# Patient Record
Sex: Male | Born: 1960 | Race: Black or African American | Hispanic: No | Marital: Single | Smoking: Current every day smoker
Health system: Southern US, Community
[De-identification: ages and names within clinical notes are randomized; demographics above are authoritative.]

## PROBLEM LIST (undated history)

## (undated) DIAGNOSIS — I10 Essential (primary) hypertension: Secondary | ICD-10-CM

## (undated) DIAGNOSIS — I671 Cerebral aneurysm, nonruptured: Secondary | ICD-10-CM

## (undated) DIAGNOSIS — N189 Chronic kidney disease, unspecified: Secondary | ICD-10-CM

## (undated) DIAGNOSIS — E785 Hyperlipidemia, unspecified: Secondary | ICD-10-CM

## (undated) DIAGNOSIS — E119 Type 2 diabetes mellitus without complications: Secondary | ICD-10-CM

## (undated) HISTORY — PX: KIDNEY DONATION: SHX685

## (undated) HISTORY — DX: Chronic kidney disease, unspecified: N18.9

## (undated) HISTORY — DX: Essential (primary) hypertension: I10

## (undated) HISTORY — PX: MANDIBLE FRACTURE SURGERY: SHX706

## (undated) HISTORY — DX: Hyperlipidemia, unspecified: E78.5

## (undated) HISTORY — PX: ROTATOR CUFF REPAIR: SHX139

---

## 2014-01-16 DEATH — deceased

## 2019-02-08 ENCOUNTER — Other Ambulatory Visit: Payer: Self-pay

## 2019-02-08 DIAGNOSIS — Z20822 Contact with and (suspected) exposure to covid-19: Secondary | ICD-10-CM

## 2019-02-09 LAB — NOVEL CORONAVIRUS, NAA: SARS-CoV-2, NAA: NOT DETECTED

## 2019-02-26 ENCOUNTER — Other Ambulatory Visit: Payer: Self-pay

## 2019-02-26 ENCOUNTER — Emergency Department (HOSPITAL_COMMUNITY): Payer: Self-pay

## 2019-02-26 ENCOUNTER — Encounter (HOSPITAL_COMMUNITY): Payer: Self-pay

## 2019-02-26 ENCOUNTER — Emergency Department (HOSPITAL_COMMUNITY)
Admission: EM | Admit: 2019-02-26 | Discharge: 2019-02-26 | Disposition: A | Discharge status: Home / Self Care | Payer: Self-pay | Attending: Emergency Medicine | Admitting: Emergency Medicine

## 2019-02-26 DIAGNOSIS — R519 Headache, unspecified: Secondary | ICD-10-CM | POA: Insufficient documentation

## 2019-02-26 DIAGNOSIS — Z7984 Long term (current) use of oral hypoglycemic drugs: Secondary | ICD-10-CM | POA: Insufficient documentation

## 2019-02-26 DIAGNOSIS — R42 Dizziness and giddiness: Secondary | ICD-10-CM | POA: Insufficient documentation

## 2019-02-26 DIAGNOSIS — Z760 Encounter for issue of repeat prescription: Secondary | ICD-10-CM | POA: Insufficient documentation

## 2019-02-26 HISTORY — DX: Type 2 diabetes mellitus without complications: E11.9

## 2019-02-26 LAB — CBC WITH DIFFERENTIAL/PLATELET
Abs Immature Granulocytes: 0.04 10*3/uL (ref 0.00–0.07)
Basophils Absolute: 0.1 10*3/uL (ref 0.0–0.1)
Basophils Relative: 1 %
Eosinophils Absolute: 0.4 10*3/uL (ref 0.0–0.5)
Eosinophils Relative: 4 %
HCT: 44 % (ref 39.0–52.0)
Hemoglobin: 13.7 g/dL (ref 13.0–17.0)
Immature Granulocytes: 0 %
Lymphocytes Relative: 28 %
Lymphs Abs: 2.6 10*3/uL (ref 0.7–4.0)
MCH: 28.8 pg (ref 26.0–34.0)
MCHC: 31.1 g/dL (ref 30.0–36.0)
MCV: 92.4 fL (ref 80.0–100.0)
Monocytes Absolute: 0.7 10*3/uL (ref 0.1–1.0)
Monocytes Relative: 8 %
Neutro Abs: 5.4 10*3/uL (ref 1.7–7.7)
Neutrophils Relative %: 59 %
Platelets: 299 10*3/uL (ref 150–400)
RBC: 4.76 MIL/uL (ref 4.22–5.81)
RDW: 15 % (ref 11.5–15.5)
WBC: 9.2 10*3/uL (ref 4.0–10.5)
nRBC: 0 % (ref 0.0–0.2)

## 2019-02-26 LAB — BASIC METABOLIC PANEL
Anion gap: 7 (ref 5–15)
BUN: 11 mg/dL (ref 6–20)
CO2: 28 mmol/L (ref 22–32)
Calcium: 8.6 mg/dL — ABNORMAL LOW (ref 8.9–10.3)
Chloride: 103 mmol/L (ref 98–111)
Creatinine, Ser: 1.12 mg/dL (ref 0.61–1.24)
GFR calc Af Amer: >60 mL/min (ref >60)
GFR calc non Af Amer: >60 mL/min (ref >60)
Glucose, Bld: 208 mg/dL — ABNORMAL HIGH (ref 70–99)
Potassium: 4 mmol/L (ref 3.5–5.1)
Sodium: 138 mmol/L (ref 135–145)

## 2019-02-26 LAB — URINALYSIS, ROUTINE W REFLEX MICROSCOPIC
Bilirubin Urine: NEGATIVE
Glucose, UA: 50 mg/dL — AB
Hgb urine dipstick: NEGATIVE
Ketones, ur: NEGATIVE mg/dL
Leukocytes,Ua: NEGATIVE
Nitrite: NEGATIVE
Protein, ur: NEGATIVE mg/dL
Specific Gravity, Urine: 1.005 (ref 1.005–1.030)
pH: 6 (ref 5.0–8.0)

## 2019-02-26 LAB — CBG MONITORING, ED: Glucose-Capillary: 267 mg/dL — ABNORMAL HIGH (ref 70–99)

## 2019-02-26 MED ORDER — IOHEXOL 350 MG/ML SOLN
100.0000 mL | Freq: Once | INTRAVENOUS | Status: AC | PRN
  Administered 2019-02-26: 100 mL via INTRAVENOUS

## 2019-02-26 MED ORDER — SODIUM CHLORIDE 0.9 % IV BOLUS
1000.0000 mL | Freq: Once | INTRAVENOUS | Status: AC
  Administered 2019-02-26: 1000 mL via INTRAVENOUS

## 2019-02-26 MED ORDER — HYDROXYZINE HCL 25 MG PO TABS: 25.0000 mg | ORAL_TABLET | Freq: Four times a day (QID) | ORAL | 0 refills | Status: DC

## 2019-02-26 MED ORDER — SODIUM CHLORIDE (PF) 0.9 % IJ SOLN
INTRAMUSCULAR | Status: AC
  Administered 2019-02-26: 11:00:00
  Filled 2019-02-26: qty 50

## 2019-02-26 MED ORDER — METFORMIN HCL 500 MG PO TABS: 500.0000 mg | ORAL_TABLET | Freq: Two times a day (BID) | ORAL | 0 refills | Status: DC

## 2019-02-26 NOTE — Discharge Instructions (Signed)
Follow up with PCP and Neurosurgery. If you develop sudden onset thunderclap headache. Worsening dizziness, unilateral weakness please seek reevaluation in the ED.

## 2019-02-26 NOTE — ED Triage Notes (Signed)
Pt arrived stating that he ran out of medication yesterday. Reports taking Metformin and something for itching. States last night he became lightheaded.

## 2019-02-26 NOTE — ED Provider Notes (Signed)
[facility]-EMERGENCY DEPT Provider Note   CSN: [id number] Arrival date & time: 02/26/19  [id number]   History   Chief Complaint Chief Complaint  Patient presents with   Medication Refill   Hyperglycemia   HPI Curtis Williamson is a 58 y.o. male with no significant past medical history who presents for evaluation of multiple complaint. Patient states his daughter and him recently moved from [place] this week. He is out of his DM medication (Metformin 500 mg BID). Has had intermittent lightheadedness and dizziness over the last 3 days. States he went to get his [place] drivers license and felt like he couldn't see as well out of his right out on the bottom of the screen. Cannot describe if this was a vision cut or "blurriness." Denies curtain coming down or floaters in eyes. No recent HA or injury. He is unsure is he still has vision changes currently. Denies recent head trauma or injury. States he has also had an intermittent frontal HA. Started gradually 3 days ago. Denies sudden onset thunderclap HA. Denies fever, chills, N/V, neck pain, neck stiffness, facial droop, drooling, unilateral weakness, CP, SOB, hemoptysis, abd pain, dysuria, constipation. Has not taken anything for his symptoms. Denies additional aggravating or alleviating factors. Admits to recent stressors. His daughter and him recently moved into a shelter last night. He cannot described his dizziness. Last took Metformin 3 days ago.  History obtained from patient and past medical records. No interpretor was used.     HPI  Past Medical History:  Diagnosis Date   Diabetes mellitus without complication (HCC)     There are no active problems to display for this patient.   History reviewed. No pertinent surgical history.    Home Medications    Prior to Admission medications   Medication Sig Start Date End Date Taking? Authorizing Provider  hydrOXYzine (ATARAX/VISTARIL) 25 MG tablet Take 1 tablet (25  mg total) by mouth every 6 (six) hours. 02/26/19   Cricket Goodlin A, PA-C  metFORMIN (GLUCOPHAGE) 500 MG tablet Take 1 tablet (500 mg total) by mouth 2 (two) times daily with a meal. 02/26/19 03/28/19  Mercadez Heitman A, PA-C    Family History No family history on file.  Social History Social History   Tobacco Use   Smoking status: Not on file  Substance Use Topics   Alcohol use: Not on file   Drug use: Not on file     Allergies   Mushroom extract complex   Review of Systems Review of Systems  Constitutional: Negative.   HENT: Negative.   Respiratory: Negative.   Cardiovascular: Negative.   Gastrointestinal: Negative.   Genitourinary: Negative.   Musculoskeletal: Negative.   Skin: Negative.   Neurological: Positive for dizziness and headaches. Negative for tremors, seizures, syncope, facial asymmetry, speech difficulty, weakness, light-headedness and numbness.  All other systems reviewed and are negative.  Physical Exam Updated Vital Signs BP (!) 147/102 (BP Location: Left Arm)    Pulse 81    Temp 98.3 F (36.8 C) (Oral)    Resp 16    SpO2 99%   Physical Exam Vitals signs and nursing note reviewed.  Constitutional:      General: He is not in acute distress.    Appearance: He is well-developed. He is not ill-appearing, toxic-appearing or diaphoretic.  HENT:     Head: Normocephalic and atraumatic.  Eyes:     General: Vision grossly intact.     Extraocular Movements: Extraocular movements intact.  Conjunctiva/sclera: Conjunctivae normal.     Pupils: Pupils are equal, round, and reactive to light.     Visual Fields: Right eye visual fields normal and left eye visual fields normal.     Comments: No nystagmus  Neck:     Musculoskeletal: Normal range of motion and neck supple.     Comments:  Normal range of motion. Neck supple.  Full active and passive ROM without pain No midline or paraspinal tenderness No nuchal rigidity or meningeal signs    Cardiovascular:     Rate and Rhythm: Normal rate and regular rhythm.  Pulmonary:     Effort: Pulmonary effort is normal. No respiratory distress.  Abdominal:     General: There is no distension.     Palpations: Abdomen is soft.  Musculoskeletal: Normal range of motion.  Skin:    General: Skin is warm and dry.  Neurological:     Mental Status: He is alert.     Cranial Nerves: Cranial nerves are intact.     Sensory: Sensation is intact.     Motor: Motor function is intact.     Coordination: Coordination is intact.     Gait: Gait is intact.     Comments: Mental Status:  Alert, oriented, thought content appropriate. Speech fluent without evidence of aphasia. Able to follow 2 step commands without difficulty.  Cranial Nerves:  II:  Peripheral visual fields grossly normal, pupils equal, round, reactive to light III,IV, VI: ptosis not present, extra-ocular motions intact bilaterally  V,VII: smile symmetric, facial light touch sensation equal VIII: hearing grossly normal bilaterally  IX,X: midline uvula rise  XI: bilateral shoulder shrug equal and strong XII: midline tongue extension  Motor:  5/5 in upper and lower extremities bilaterally including strong and equal grip strength and dorsiflexion/plantar flexion Sensory: Pinprick and light touch normal in all extremities.  Deep Tendon Reflexes: 2+ and symmetric  Cerebellar: normal finger-to-nose with bilateral upper extremities Gait: normal gait and balance CV: distal pulses palpable throughout      ED Treatments / Results  Labs (all labs ordered are listed, but only abnormal results are displayed) Labs Reviewed  BASIC METABOLIC PANEL - Abnormal; Notable for the following components:      Result Value   Glucose, Bld 208 (*)    Calcium 8.6 (*)    All other components within normal limits  URINALYSIS, ROUTINE W REFLEX MICROSCOPIC - Abnormal; Notable for the following components:   Color, Urine AMBER (*)    Glucose, UA 50 (*)     All other components within normal limits  CBG MONITORING, ED - Abnormal; Notable for the following components:   Glucose-Capillary 267 (*)    All other components within normal limits  CBC WITH DIFFERENTIAL/PLATELET  CBG MONITORING, ED    EKG EKG Interpretation  Date/Time:  Tuesday February 26 2019 07:38:16 EST Ventricular Rate:  68 PR Interval:    QRS Duration: 95 QT Interval:  429 QTC Calculation: 457 R Axis:   50 Text Interpretation: Sinus rhythm Baseline wander in lead(s) V2 Confirmed by Virgina Norfolk [phone]) on 02/26/2019 7:43:13 AM   Radiology Ct Angio Head W Or Wo Contrast  Result Date: 02/26/2019 CLINICAL DATA:  Vertigo EXAM: CT ANGIOGRAPHY HEAD AND NECK TECHNIQUE: Multidetector CT imaging of the head and neck was performed using the standard protocol during bolus administration of intravenous contrast. Multiplanar CT image reconstructions and MIPs were obtained to evaluate the vascular anatomy. Carotid stenosis measurements (when applicable) are obtained utilizing NASCET criteria, using  the distal internal carotid diameter as the denominator. CONTRAST:  OMNIPAQUE IOHEXOL 350 MG/ML SOLN COMPARISON:  None. FINDINGS: CT HEAD FINDINGS Brain: No evidence of acute abnormality, including acute infarct, hemorrhage, hydrocephalus, or mass lesion. Vascular: No hyperdense vessel or unexpected calcification. Skull: Unremarkable. Sinuses: Unremarkable. Orbits: Unremarkable. Review of the MIP images confirms the above findings CTA NECK FINDINGS Aortic arch: Great vessel origins are patent. There is direct origin the left vertebral artery from the aortic arch. There is also direct origin of the right vertebral artery for the aortic arch with the vessel traversing posterior to the esophagus. Right carotid system: Common, internal, and external carotid arteries are patent. No significant stenosis or evidence of dissection. Left carotid system: Common, internal, and external carotid arteries  are patent. No significant stenosis or evidence of dissection. Vertebral arteries: Codominant. Skeleton: Mild degenerative changes of the cervical spine. Other neck: Few very small thyroid nodules are noted. No neck mass adenopathy. Upper chest: No apical lung mass. Review of the MIP images confirms the above findings CTA HEAD FINDINGS Anterior circulation: Intracranial carotid arteries are patent. There is a broad-based 2 mm inferiorly directed outpouching from the distal supraclinoid left ICA near the expected location of a posterior communicating artery. Middle and anterior cerebral arteries are patent. Left A1 ACA dominant. Posterior circulation: Intracranial vertebral, basilar, and posterior cerebral arteries are patent. Venous sinuses: Patent as permitted by contrast timing Anatomic variants: None. Review of the MIP images confirms the above findings IMPRESSION: No acute intracranial abnormality. No large vessel occlusion, significant stenosis, or evidence of dissection. 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 Electronically Signed   By: Guadlupe Spanish M.D.   On: 02/26/2019 09:16   Ct Angio Neck W And/or Wo Contrast  Result Date: 02/26/2019 CLINICAL DATA:  Vertigo EXAM: CT ANGIOGRAPHY HEAD AND NECK TECHNIQUE: Multidetector CT imaging of the head and neck was performed using the standard protocol during bolus administration of intravenous contrast. Multiplanar CT image reconstructions and MIPs were obtained to evaluate the vascular anatomy. Carotid stenosis measurements (when applicable) are obtained utilizing NASCET criteria, using the distal internal carotid diameter as the denominator. CONTRAST:  OMNIPAQUE IOHEXOL 350 MG/ML SOLN COMPARISON:  None. FINDINGS: CT HEAD FINDINGS Brain: No evidence of acute abnormality, including acute infarct, hemorrhage, hydrocephalus, or mass lesion. Vascular: No hyperdense vessel or unexpected calcification. Skull: Unremarkable.  Sinuses: Unremarkable. Orbits: Unremarkable. Review of the MIP images confirms the above findings CTA NECK FINDINGS Aortic arch: Great vessel origins are patent. There is direct origin the left vertebral artery from the aortic arch. There is also direct origin of the right vertebral artery for the aortic arch with the vessel traversing posterior to the esophagus. Right carotid system: Common, internal, and external carotid arteries are patent. No significant stenosis or evidence of dissection. Left carotid system: Common, internal, and external carotid arteries are patent. No significant stenosis or evidence of dissection. Vertebral arteries: Codominant. Skeleton: Mild degenerative changes of the cervical spine. Other neck: Few very small thyroid nodules are noted. No neck mass adenopathy. Upper chest: No apical lung mass. Review of the MIP images confirms the above findings CTA HEAD FINDINGS Anterior circulation: Intracranial carotid arteries are patent. There is a broad-based 2 mm inferiorly directed outpouching from the distal supraclinoid left ICA near the expected location of a posterior communicating artery. Middle and anterior cerebral arteries are patent. Left A1 ACA dominant. Posterior circulation: Intracranial vertebral, basilar, and posterior cerebral arteries are patent. Venous sinuses:  Patent as permitted by contrast timing Anatomic variants: None. Review of the MIP images confirms the above findings IMPRESSION: No acute intracranial abnormality. No large vessel occlusion, significant stenosis, or evidence of dissection. 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 Electronically Signed   By: Guadlupe Spanish M.D.   On: 02/26/2019 09:16    Procedures Procedures (including critical care time)  Medications Ordered in ED Medications  sodium chloride (PF) 0.9 % injection (has no administration in time range)  sodium chloride 0.9 % bolus 1,000 mL (1,000 mLs Intravenous New  Bag/Given 02/26/19 0733)  iohexol (OMNIPAQUE) 350 MG/ML injection 100 mL (100 mLs Intravenous Contrast Given 02/26/19 0831)   Initial Impression / Assessment and Plan / ED Course  I have reviewed the triage vital signs and the nursing notes.  Pertinent labs & imaging results that were available during my care of the patient were reviewed by me and considered in my medical decision making (see chart for details).  58 year old who appear otherwise well presents for multiple complaints. Afebrile, non septic, non ill appearing. Intermittent HA and Dizziness over the last 3-4 days. Possible left eye blurred vision vs field cut yesterday while at [facility] office? He cannot described dizziness and is unsure if he has current field cut/blurred vision? Normal neuro exam without deficits. No current vision changes here in ED. Ambulatory without ataxic gait. No CP/ SOB. Given possible vision cut yesterday with HA and dizziness will obtained labs, CTA head/neck to rule out aneurysm, SAH, acute CVA.  Labs and imaging personally reviewed: CBC without leukocytosis, Hgb 13.7 BMP hyperglycemia to 208 Urinalysis negative for infection, no ketones, No DKA or HHS. 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 EKG without ST/T changes  0830: Patient ambulatory to the CT scanner without ataxic gait. Denies any current dizziness or visual field cuts. Patient not states that he thinks yesterday that is was more "burry eyes" than a visual field cut.  0944: Patient tolerating PO intake without difficulty. Continued Non focal neuro exam without deficits. No visual field defects on exam. Discussed results of CT with attending Dr. Lockie Mola. Feels patient can follow up outpatient with Neurosurgery. No acute CVA, SAH. Sx non consistent with amaurosis fugax or TIA. Discussed plan with patient. He agrees with plan to follow up with NS outpatient. Will refill his home meds.  Patient with  unilateral nystagmus, negative skew test and normal neurologic exam.  No vertical or rotational nystagmus. Patient normal finger-nose and normal gait.  No slurred speech renal or weakness.  Doubt CVA or other central cause of vertigo.  The patient has been appropriately medically screened and/or stabilized in the ED. I have low suspicion for any other emergent medical condition which would require further screening, evaluation or treatment in the ED or require inpatient management.  Patient is hemodynamically stable and in no acute distress.  Patient able to ambulate in department prior to ED.  Evaluation does not show acute pathology that would require ongoing or additional emergent interventions while in the emergency department or further inpatient treatment.  I have discussed the diagnosis with the patient and answered all questions.  Pain is been managed while in the emergency department and patient has no further complaints prior to discharge.  Patient is comfortable with plan discussed in room and is stable for discharge at this time.  I have discussed strict return precautions for returning to the emergency department.  Patient  was encouraged to follow-up with PCP/specialist refer to at discharge.       Final Clinical Impressions(s) / ED Diagnoses   Final diagnoses:  Medication refill  Dizziness    ED Discharge Orders         Ordered    metFORMIN (GLUCOPHAGE) 500 MG tablet  2 times daily with meals     02/26/19 0950    hydrOXYzine (ATARAX/VISTARIL) 25 MG tablet  Every 6 hours     02/26/19 0950           Herchel Hopkin A, PA-C 02/26/19 0952    Lennice Sites, DO 02/26/19 1026

## 2019-03-25 ENCOUNTER — Telehealth: Payer: Self-pay

## 2019-03-25 NOTE — Telephone Encounter (Signed)

## 2019-03-26 ENCOUNTER — Other Ambulatory Visit: Payer: Self-pay

## 2019-03-26 ENCOUNTER — Ambulatory Visit (INDEPENDENT_AMBULATORY_CARE_PROVIDER_SITE_OTHER): Payer: Medicaid Other | Admitting: Internal Medicine

## 2019-03-26 VITALS — BP 149/96 | HR 78 | Temp 97.3°F | Resp 18 | Ht 69.0 in | Wt 206.0 lb

## 2019-03-26 DIAGNOSIS — E119 Type 2 diabetes mellitus without complications: Secondary | ICD-10-CM | POA: Diagnosis not present

## 2019-03-26 DIAGNOSIS — G4489 Other headache syndrome: Secondary | ICD-10-CM

## 2019-03-26 DIAGNOSIS — F17200 Nicotine dependence, unspecified, uncomplicated: Secondary | ICD-10-CM | POA: Insufficient documentation

## 2019-03-26 DIAGNOSIS — I671 Cerebral aneurysm, nonruptured: Secondary | ICD-10-CM | POA: Diagnosis not present

## 2019-03-26 DIAGNOSIS — R519 Headache, unspecified: Secondary | ICD-10-CM | POA: Insufficient documentation

## 2019-03-26 DIAGNOSIS — F17210 Nicotine dependence, cigarettes, uncomplicated: Secondary | ICD-10-CM

## 2019-03-26 DIAGNOSIS — Z2821 Immunization not carried out because of patient refusal: Secondary | ICD-10-CM

## 2019-03-26 DIAGNOSIS — Z5320 Procedure and treatment not carried out because of patient's decision for unspecified reasons: Secondary | ICD-10-CM

## 2019-03-26 DIAGNOSIS — I10 Essential (primary) hypertension: Secondary | ICD-10-CM | POA: Diagnosis not present

## 2019-03-26 MED ORDER — TRUE METRIX METER W/DEVICE KIT: PACK | 0 refills | Status: DC

## 2019-03-26 MED ORDER — TRUEPLUS LANCETS 28G MISC: 4 refills | Status: DC

## 2019-03-26 MED ORDER — TRUE METRIX BLOOD GLUCOSE TEST VI STRP: ORAL_STRIP | 12 refills | Status: DC

## 2019-03-26 MED ORDER — LISINOPRIL-HYDROCHLOROTHIAZIDE 10-12.5 MG PO TABS: 1.0000 | ORAL_TABLET | Freq: Every day | ORAL | 4 refills | Status: DC

## 2019-03-26 MED ORDER — VARENICLINE TARTRATE 0.5 MG PO TABS: 0.5000 mg | ORAL_TABLET | Freq: Two times a day (BID) | ORAL | 0 refills | Status: DC

## 2019-03-26 MED ORDER — METFORMIN HCL 500 MG PO TABS: 500.0000 mg | ORAL_TABLET | Freq: Two times a day (BID) | ORAL | 6 refills | Status: DC

## 2019-03-26 NOTE — Progress Notes (Signed)
Patient ID: Curtis Williamson, male    DOB: February 21, 1961  MRN: [id number]  CC: Establish Care and Diabetes   Subjective: Curtis Williamson is a 58 y.o. male who presents for new pt visit at [facility] His concerns today include:   Pt moved to [place] end of Sept from [place].  Pt has hx of DM and chronic itching. Seen ER at [facility] 02/26/2019 with hyperglycemia, lightheadedness, frontal HA , and poor vision.  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  On Metformin.  Had a glucometer but lost it  when he moved Eating: Did nutrition class in 2005 prior to donating kidney to his niece.  Loss 72 lbs at the time and was able to keep it off -"I'm a meat and potatoes man." Not much veggies.  Drinks sodas, juice, milk.  Not enough water No numbness or tingling in hands and feet C/o blurred vision when reading.  Last eye exam was 2 yrs ago. Wears OTC readers -not very active A1C 6.1 done last mth before thanksgiving at [facility].   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  Last saw PCP [place] in 11/2018 -endorses LT sided temple HA intermittently x 2 mths.  Last 1-2 hrs because he takes something for it. Takes Ibuprofen or ASA.  If he does not take anything, can last 4 hrs.  No associated N/V or dizziness.  Light bothers eyes even without HA.  Has not paid attention to whether vision any worse with HA.  Since ER visit, -freq of HA dec from Q day to few times a wk and not as severe.  No increased pain in the shoulder girdle -No chest pains or shortness of breath  Tob dep: smokes 5-6 cig/day.  Smoked since age 5.  Quit for 1-2 yrs  Around 1997 and again in 2003. Did hypnotherapy. Used the nicotine patch but did  Not like it.  Ready to quit  Past medical, social, family history and surgical histories reviewed  Current Outpatient Medications on File Prior to Visit  Medication Sig Dispense Refill  . hydrOXYzine  (ATARAX/VISTARIL) 25 MG tablet Take 1 tablet (25 mg total) by mouth every 6 (six) hours. 12 tablet 0   No current facility-administered medications on file prior to visit.     Allergies  Allergen Reactions  . Mushroom Extract Complex     Social History   Socioeconomic History  . Marital status: Single    Spouse name: Not on file  . Number of children: 1  . Years of education: Not on file  . Highest education level: Some college, no degree  Occupational History  . Occupation: [employer] operational officer  . Financial resource strain: Not on file  . Food insecurity    Worry: Not on file    Inability: Not on file  . Transportation needs    Medical: Not on file    Non-medical: Not on file  Tobacco Use  . Smoking status: Current Every Day Smoker  . Smokeless tobacco: Never Used  Substance and Sexual Activity  . Alcohol use: Yes  . Drug use: Not on file  . Sexual activity: Not on file  Lifestyle  . Physical activity    Days per week: Not on file    Minutes per session: Not on file  . Stress: Not on file  Relationships  . Social  connections    Talks on phone: Not on file    Gets together: Not on file    Attends religious service: Not on file    Active member of club or organization: Not on file    Attends meetings of clubs or organizations: Not on file    Relationship status: Not on file  . Intimate partner violence    Fear of current or ex partner: Not on file    Emotionally abused: Not on file    Physically abused: Not on file    Forced sexual activity: Not on file  Other Topics Concern  . Not on file  Social History Narrative  . Not on file    Family History  Problem Relation Age of Onset  . Hypertension Mother   . Kidney failure Mother   . Kidney disease Mother   . Heart disease Mother   . ADD / ADHD Daughter   . Anxiety disorder Daughter   . Alcohol abuse Father     Past Surgical History:  Procedure Laterality Date  . KIDNEY DONATION    . MANDIBLE  FRACTURE SURGERY    . ROTATOR CUFF REPAIR Right     ROS: Review of Systems Negative except as stated above  PHYSICAL EXAM: BP (!) 149/96   Pulse 78   Temp (!) 97.3 F (36.3 C) (Temporal)   Resp 18   Ht 5' 9" (1.753 m)   Wt 206 lb (93.4 kg)   SpO2 94%   BMI 30.42 kg/m   Physical Exam  General appearance - alert, well appearing, older African-American male and in no distress Mental status - normal mood, behavior, speech, dress, motor activity, and thought processes Eyes - pupils equal and reactive, extraocular eye movements intact Nose -severe enlargement of nasal turbinates (hx of nasal bone fx) Mouth - mucous membranes moist, pharynx normal without lesions Neck - supple, no significant adenopathy, no thyroid enlargement Lymphatics -no axillary lymphadenopathy  chest - clear to auscultation, no wheezes, rales or rhonchi, symmetric air entry Heart - normal rate, regular rhythm, normal S1, S2, no murmurs, rubs, clicks or gallops Neurological -nonfocal.  Slight tenderness over the left temple area but not over the temporal artery Extremities -no lower extremity edema CMP Latest Ref Rng & Units 02/26/2019  Glucose 70 - 99 mg/dL 208(H)  BUN 6 - 20 mg/dL 11  Creatinine 0.61 - 1.24 mg/dL 1.12  Sodium 135 - 145 mmol/L 138  Potassium 3.5 - 5.1 mmol/L 4.0  Chloride 98 - 111 mmol/L 103  CO2 22 - 32 mmol/L 28  Calcium 8.9 - 10.3 mg/dL 8.6(L)   Lipid Panel  No results found for: CHOL, TRIG, HDL, CHOLHDL, VLDL, LDLCALC, LDLDIRECT  CBC    Component Value Date/Time   WBC 9.2 02/26/2019 0739   RBC 4.76 02/26/2019 0739   HGB 13.7 02/26/2019 0739   HCT 44.0 02/26/2019 0739   PLT 299 02/26/2019 0739   MCV 92.4 02/26/2019 0739   MCH 28.8 02/26/2019 0739   MCHC 31.1 02/26/2019 0739   RDW 15.0 02/26/2019 0739   LYMPHSABS 2.6 02/26/2019 0739   MONOABS 0.7 02/26/2019 0739   EOSABS 0.4 02/26/2019 0739   BASOSABS 0.1 02/26/2019 0739    ASSESSMENT AND PLAN: 1. Type 2 diabetes  mellitus without complication, without long-term current use of insulin ([place]) Patient reports recent A1c of 6.1.  If still his diabetes is under good control.  Dietary counseling given.  I feel he would benefit from seeing the nutritionist.  Advised to eliminate sugary drinks from his diet.  Encouraged him to get in some form of regular exercise with a goal of 150 minutes/week. Continue Metformin - Hemoglobin A1c - Microalbumin/Creatinine Ratio, Urine - metFORMIN (GLUCOPHAGE) 500 MG tablet; Take 1 tablet (500 mg total) by mouth 2 (two) times daily with a meal.  Dispense: 60 tablet; Refill: 6 - Lipid panel - Hepatic Function Panel - glucose blood (TRUE METRIX BLOOD GLUCOSE TEST) test strip; Use as instructed  Dispense: 100 each; Refill: 12 - Blood Glucose Monitoring Suppl (TRUE METRIX METER) w/Device KIT; Use as directed  Dispense: 1 kit; Refill: 0 - TRUEplus Lancets 28G MISC; Use as directed  Dispense: 100 each; Refill: 4 - Ambulatory referral to Ophthalmology - Amb ref to Medical Nutrition Therapy-MNT  2. Essential hypertension Recommend start lisinopril/hydrochlorothiazide.  DASH diet discussed and encouraged. - lisinopril-hydrochlorothiazide (ZESTORETIC) 10-12.5 MG tablet; Take 1 tablet by mouth daily.  Dispense: 30 tablet; Refill: 4  3. New onset of headaches after age 9 We will bring him back in a few weeks to recheck blood pressure and see whether the headaches have improved with better blood pressure control. - CT Head Wo Contrast; Future - Ambulatory referral to Neurology - Sedimentation Rate  4. Aneurysm, cerebral, nonruptured - Ambulatory referral to Neurology  5. Influenza vaccination declined This was offered and patient declined.  6. HIV screening declined Declined  7. Tobacco dependence Patient advised to quit smoking. Discussed health risks associated with smoking including lung and other types of cancers, chronic lung diseases and CV risks.. Pt ready to give trail  of quitting.  Discussed methods to help quit including quitting cold [place], use of NRT, Chantix and Bupropion.  He is willing to try Chantix.  I went over possible side effects of the medication including mood swings and bad dreams.  Less than 5 minutes spent on counseling.  - varenicline (CHANTIX) 0.5 MG tablet; Take 1 tablet (0.5 mg total) by mouth 2 (two) times daily.  Dispense: 60 tablet; Refill: 0     Patient was given the opportunity to ask questions.  Patient verbalized understanding of the plan and was able to repeat key elements of the plan.   Orders Placed This Encounter  Procedures  . CT Head Wo Contrast  . Hemoglobin A1c  . Microalbumin/Creatinine Ratio, Urine  . Lipid panel  . Hepatic Function Panel  . Sedimentation Rate  . Ambulatory referral to Ophthalmology  . Amb ref to Medical Nutrition Therapy-MNT  . Ambulatory referral to Neurology     Requested Prescriptions   Signed Prescriptions Disp Refills  . metFORMIN (GLUCOPHAGE) 500 MG tablet 60 tablet 6    Sig: Take 1 tablet (500 mg total) by mouth 2 (two) times daily with a meal.  . glucose blood (TRUE METRIX BLOOD GLUCOSE TEST) test strip 100 each 12    Sig: Use as instructed  . Blood Glucose Monitoring Suppl (TRUE METRIX METER) w/Device KIT 1 kit 0    Sig: Use as directed  . TRUEplus Lancets 28G MISC 100 each 4    Sig: Use as directed  . lisinopril-hydrochlorothiazide (ZESTORETIC) 10-12.5 MG tablet 30 tablet 4    Sig: Take 1 tablet by mouth daily.    Return in about 2 weeks (around 04/09/2019) for BP recheck.  Karle Plumber, MD, FACP

## 2019-03-26 NOTE — Patient Instructions (Addendum)
Your blood pressure is not controlled.  Goal for blood pressure is 130/80 or lower.  I have sent a prescription to the pharmacy for blood pressure medication called lisinopril/HCTZ for you to take once a day.  Try to limit salt in the food the salt does affect the blood pressure.   Diabetes Mellitus and Standards of Medical Care Managing diabetes (diabetes mellitus) can be complicated. Your diabetes treatment may be managed by a team of health care providers, including:  A physician who specializes in diabetes (endocrinologist).  A nurse practitioner or physician assistant.  Nurses.  A diet and nutrition specialist (registered dietitian).  A certified diabetes educator (CDE).  An exercise specialist.  A pharmacist.  An eye doctor.  A foot specialist (podiatrist).  A dentist.  A primary care provider.  A mental health provider. Your health care providers follow guidelines to help you get the best quality of care. The following schedule is a general guideline for your diabetes management plan. Your health care providers may give you more specific instructions. Physical exams Upon being diagnosed with diabetes mellitus, and each year after that, your health care provider will ask about your medical and family history. He or she will also do a physical exam. Your exam may include:  Measuring your height, weight, and body mass index (BMI).  Checking your blood pressure. This will be done at every routine medical visit. Your target blood pressure may vary depending on your medical conditions, your age, and other factors.  Thyroid gland exam.  Skin exam.  Screening for damage to your nerves (peripheral neuropathy). This may include checking the pulse in your legs and feet and checking the level of sensation in your hands and feet.  A complete foot exam to inspect the structure and skin of your feet, including checking for cuts, bruises, redness, blisters, sores, or other  problems.  Screening for blood vessel (vascular) problems, which may include checking the pulse in your legs and feet and checking your temperature. Blood tests Depending on your treatment plan and your personal needs, you may have the following tests done:  HbA1c (hemoglobin A1c). This test provides information about blood sugar (glucose) control over the previous 2-3 months. It is used to adjust your treatment plan, if needed. This test will be done: ? At least 2 times a year, if you are meeting your treatment goals. ? 4 times a year, if you are not meeting your treatment goals or if treatment goals have changed.  Lipid testing, including total, LDL, and HDL cholesterol and triglyceride levels. ? The goal for LDL is less than 100 mg/dL (5.5 mmol/L). If you are at high risk for complications, the goal is less than 70 mg/dL (3.9 mmol/L). ? The goal for HDL is 40 mg/dL (2.2 mmol/L) or higher for men and 50 mg/dL (2.8 mmol/L) or higher for women. An HDL cholesterol of 60 mg/dL (3.3 mmol/L) or higher gives some protection against heart disease. ? The goal for triglycerides is less than 150 mg/dL (8.3 mmol/L).  Liver function tests.  Kidney function tests.  Thyroid function tests. Dental and eye exams  Visit your dentist two times a year.  If you have type 1 diabetes, your health care provider may recommend an eye exam 3-5 years after you are diagnosed, and then once a year after your first exam. ? For children with type 1 diabetes, a health care provider may recommend an eye exam when your child is age 14 or older and has  had diabetes for 3-5 years. After the first exam, your child should get an eye exam once a year.  If you have type 2 diabetes, your health care provider may recommend an eye exam as soon as you are diagnosed, and then once a year after your first exam. Immunizations   The yearly flu (influenza) vaccine is recommended for everyone 6 months or older who has  diabetes.  The pneumonia (pneumococcal) vaccine is recommended for everyone 2 years or older who has diabetes. If you are 40 or older, you may get the pneumonia vaccine as a series of two separate shots.  The hepatitis B vaccine is recommended for adults shortly after being diagnosed with diabetes.  Adults and children with diabetes should receive all other vaccines according to age-specific recommendations from the Centers for Disease Control and Prevention (CDC). Mental and emotional health Screening for symptoms of eating disorders, anxiety, and depression is recommended at the time of diagnosis and afterward as needed. If your screening shows that you have symptoms (positive screening result), you may need more evaluation and you may work with a mental health care provider. Treatment plan Your treatment plan will be reviewed at every medical visit. You and your health care provider will discuss:  How you are taking your medicines, including insulin.  Any side effects you are experiencing.  Your blood glucose target goals.  The frequency of your blood glucose monitoring.  Lifestyle habits, such as activity level as well as tobacco, alcohol, and substance use. Diabetes self-management education Your health care provider will assess how well you are monitoring your blood glucose levels and whether you are taking your insulin correctly. He or she may refer you to:  A certified diabetes educator to manage your diabetes throughout your life, starting at diagnosis.  A registered dietitian who can create or review your personal nutrition plan.  An exercise specialist who can discuss your activity level and exercise plan. Summary  Managing diabetes (diabetes mellitus) can be complicated. Your diabetes treatment may be managed by a team of health care providers.  Your health care providers follow guidelines in order to help you get the best quality of care.  Standards of care including  having regular physical exams, blood tests, blood pressure monitoring, immunizations, screening tests, and education about how to manage your diabetes.  Your health care providers may also give you more specific instructions based on your individual health. This information is not intended to replace advice given to you by your health care provider. Make sure you discuss any questions you have with your health care provider. Document Released: 01/30/2009 Document Revised: 12/22/2017 Document Reviewed: 01/01/2016 Elsevier Patient Education  2020 [employer].

## 2019-03-26 NOTE — Progress Notes (Signed)
New patient appointment.  Doesn't check FSBS at home. Doesn't have a meter. Denies nausea, vomiting, numbness/tingling in feet, polyuria, polydipsia.  Declines flu shot.  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 Has never been on BP medication.

## 2019-03-27 ENCOUNTER — Encounter: Payer: Self-pay | Admitting: Neurology

## 2019-03-27 ENCOUNTER — Other Ambulatory Visit: Payer: Self-pay

## 2019-03-27 ENCOUNTER — Telehealth: Payer: Self-pay

## 2019-03-27 ENCOUNTER — Ambulatory Visit: Payer: Self-pay

## 2019-03-27 DIAGNOSIS — E119 Type 2 diabetes mellitus without complications: Secondary | ICD-10-CM

## 2019-03-27 LAB — MICROALBUMIN / CREATININE URINE RATIO
Creatinine, Urine: 115.9 mg/dL
Microalb/Creat Ratio: <3 mg/g creat (ref 0–29)
Microalbumin, Urine: <3 ug/mL

## 2019-03-27 LAB — LIPID PANEL
Chol/HDL Ratio: 4.3 ratio (ref 0.0–5.0)
Cholesterol, Total: 185 mg/dL (ref 100–199)
HDL: 43 mg/dL (ref >39)
LDL Chol Calc (NIH): 119 mg/dL — ABNORMAL HIGH (ref 0–99)
Triglycerides: 130 mg/dL (ref 0–149)
VLDL Cholesterol Cal: 23 mg/dL (ref 5–40)

## 2019-03-27 LAB — HEPATIC FUNCTION PANEL
ALT: 48 IU/L — ABNORMAL HIGH (ref 0–44)
AST: 34 IU/L (ref 0–40)
Albumin: 4.7 g/dL (ref 3.8–4.9)
Alkaline Phosphatase: 91 IU/L (ref 39–117)
Bilirubin Total: 0.3 mg/dL (ref 0.0–1.2)
Bilirubin, Direct: 0.11 mg/dL (ref 0.00–0.40)
Total Protein: 7.1 g/dL (ref 6.0–8.5)

## 2019-03-27 LAB — HEMOGLOBIN A1C
Est. average glucose Bld gHb Est-mCnc: 163 mg/dL
Hgb A1c MFr Bld: 7.3 % — ABNORMAL HIGH (ref 4.8–5.6)

## 2019-03-27 MED ORDER — TRUE METRIX BLOOD GLUCOSE TEST VI STRP: ORAL_STRIP | 3 refills | Status: DC

## 2019-03-27 NOTE — Telephone Encounter (Signed)
CPT [phone] will have to be self pay since patient only has [facility].  Patient is scheduled to have CT done at  [facility] on 04/02/2019 @ 3:45 PM. No special prep. If appointment date & time do not work he can call [phone] to reschedule.  Please notify patient of appointment.

## 2019-03-28 ENCOUNTER — Other Ambulatory Visit: Payer: Self-pay

## 2019-03-28 DIAGNOSIS — E119 Type 2 diabetes mellitus without complications: Secondary | ICD-10-CM

## 2019-03-28 LAB — SEDIMENTATION RATE: Sed Rate: 4 mm/hr (ref 0–30)

## 2019-03-28 MED ORDER — ACCU-CHEK FASTCLIX LANCETS MISC: 2 refills | Status: DC

## 2019-03-28 MED ORDER — TRUE METRIX METER W/DEVICE KIT: PACK | 0 refills | Status: DC

## 2019-03-28 MED ORDER — ACCU-CHEK GUIDE ME W/DEVICE KIT: 1.0000 | PACK | Freq: Two times a day (BID) | 0 refills | Status: DC

## 2019-03-28 MED ORDER — ACCU-CHEK GUIDE VI STRP: ORAL_STRIP | 2 refills | Status: DC

## 2019-03-29 ENCOUNTER — Other Ambulatory Visit: Payer: Self-pay | Admitting: Internal Medicine

## 2019-03-30 ENCOUNTER — Other Ambulatory Visit: Payer: Self-pay | Admitting: Internal Medicine

## 2019-03-30 DIAGNOSIS — E1165 Type 2 diabetes mellitus with hyperglycemia: Secondary | ICD-10-CM

## 2019-03-30 DIAGNOSIS — R7989 Other specified abnormal findings of blood chemistry: Secondary | ICD-10-CM

## 2019-03-30 DIAGNOSIS — R945 Abnormal results of liver function studies: Secondary | ICD-10-CM

## 2019-03-30 MED ORDER — ATORVASTATIN CALCIUM 10 MG PO TABS: 10.0000 mg | ORAL_TABLET | Freq: Every day | ORAL | 4 refills | Status: DC

## 2019-04-01 NOTE — Telephone Encounter (Signed)
Pt. Aware.

## 2019-04-02 ENCOUNTER — Ambulatory Visit (HOSPITAL_COMMUNITY)
Admission: RE | Admit: 2019-04-02 | Discharge: 2019-04-02 | Disposition: A | Discharge status: Home / Self Care | Payer: Medicaid Other | Source: Ambulatory Visit | Attending: Internal Medicine | Admitting: Internal Medicine

## 2019-04-02 ENCOUNTER — Encounter: Payer: Self-pay | Admitting: Registered"

## 2019-04-02 ENCOUNTER — Other Ambulatory Visit: Payer: Self-pay

## 2019-04-02 ENCOUNTER — Encounter: Payer: Medicaid Other | Attending: Internal Medicine | Admitting: Registered"

## 2019-04-02 DIAGNOSIS — E119 Type 2 diabetes mellitus without complications: Secondary | ICD-10-CM | POA: Insufficient documentation

## 2019-04-02 DIAGNOSIS — R519 Headache, unspecified: Secondary | ICD-10-CM | POA: Diagnosis present

## 2019-04-02 IMAGING — CT CT HEAD W/O CM
3 of 4 series · 13 of 47 positions shown, 15 images · non-contrast
Comparison: [DATE]
COMPARISON: [DATE]

Addendum:
CLINICAL DATA: Headache with hypertension

EXAM:
CT HEAD WITHOUT CONTRAST
TECHNIQUE: Contiguous axial images were obtained from the base of the skull
through the vertex without intravenous contrast.

[Series 3: head without ax · axial · non-contrast · 0.36mm/px · z∈[-137,-9]mm · 7 of 36 slices shown, 9 images]
[im 5/36  brain]
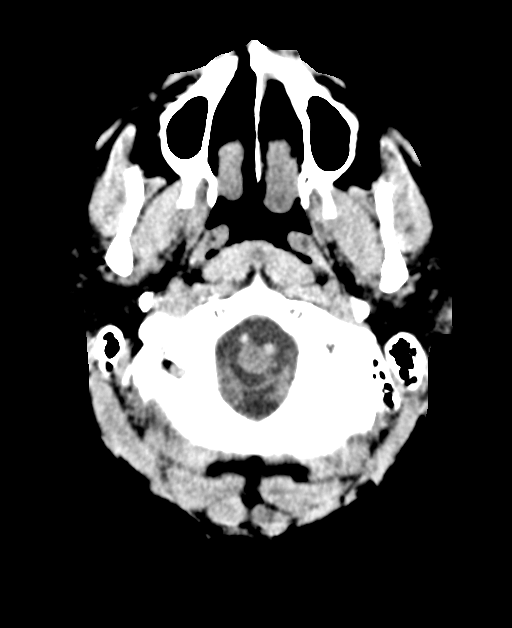
[im 5/36  bone]
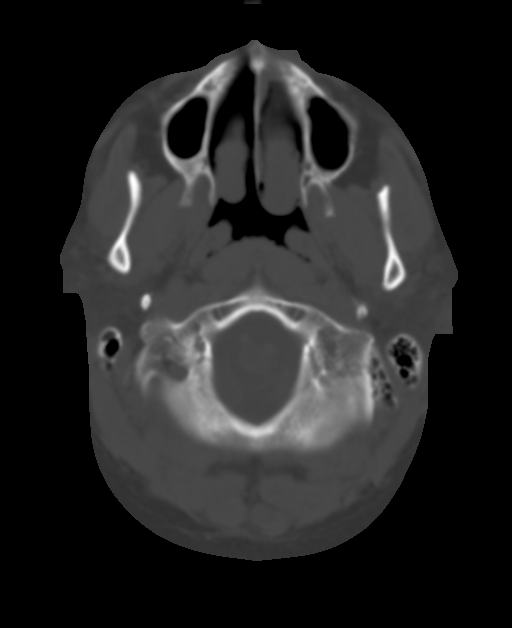
[im 9/36  brain]
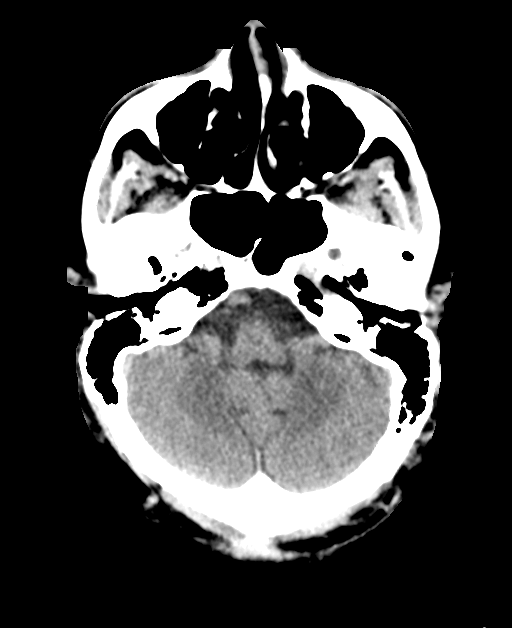
[im 14/36  brain]
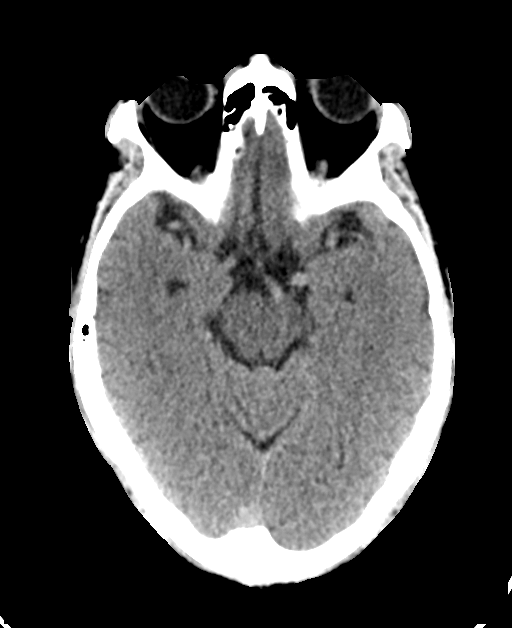
[im 18/36  brain]
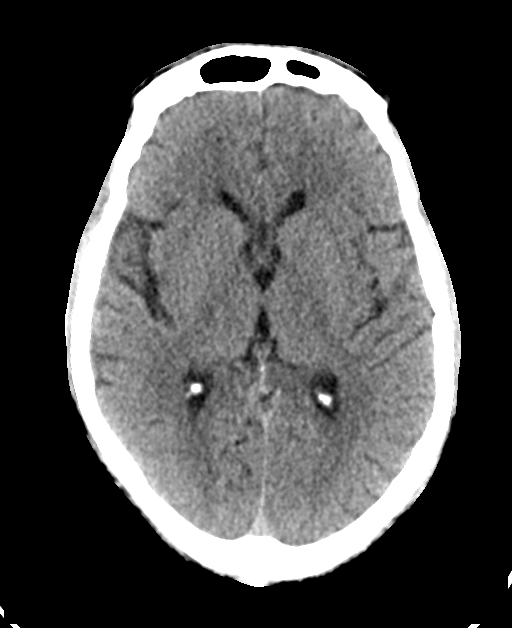
[im 22/36  brain]
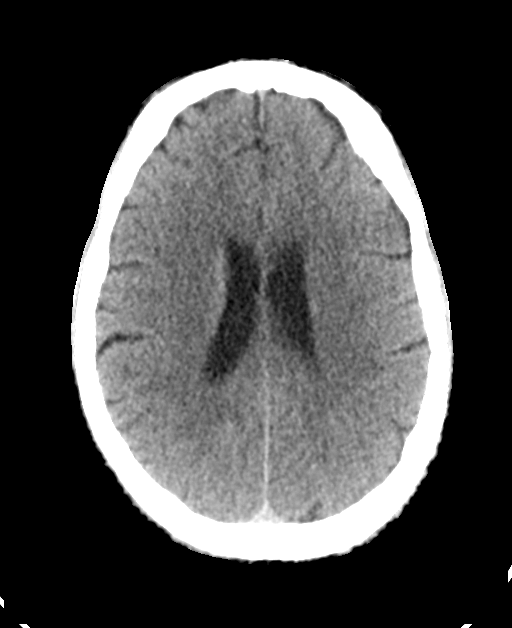
[im 22/36  bone]
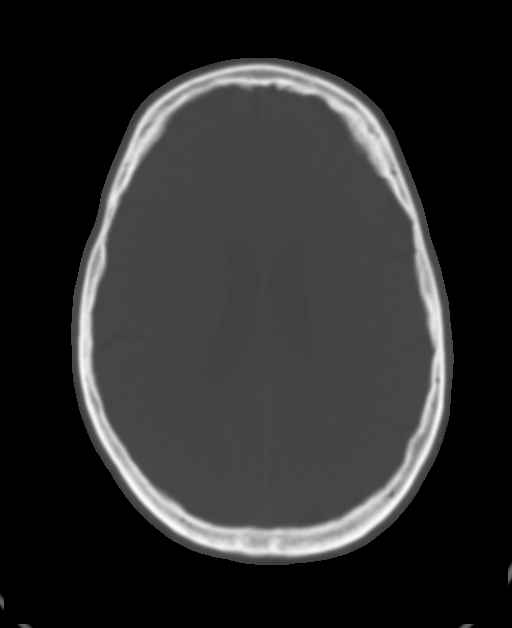
[im 27/36  brain]
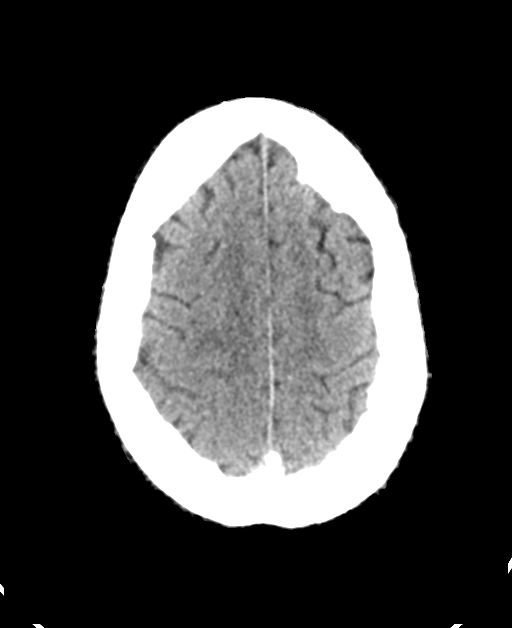
[im 31/36  brain]
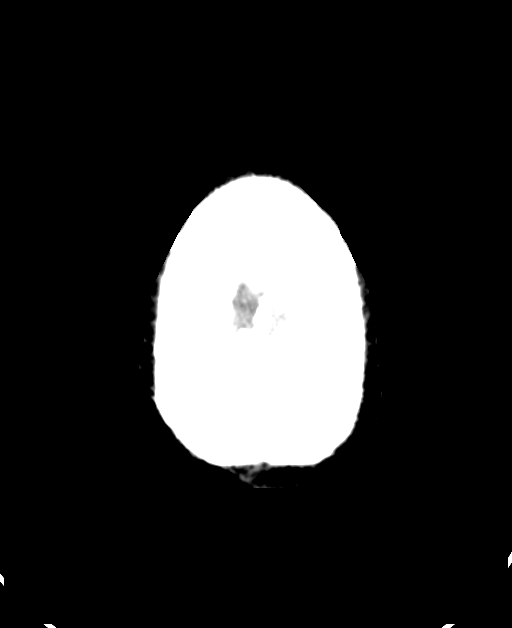

[Series 5: head without cor · coronal · non-contrast · 0.34mm/px · 3 of 78 slices shown]
[im 26/78  brain]
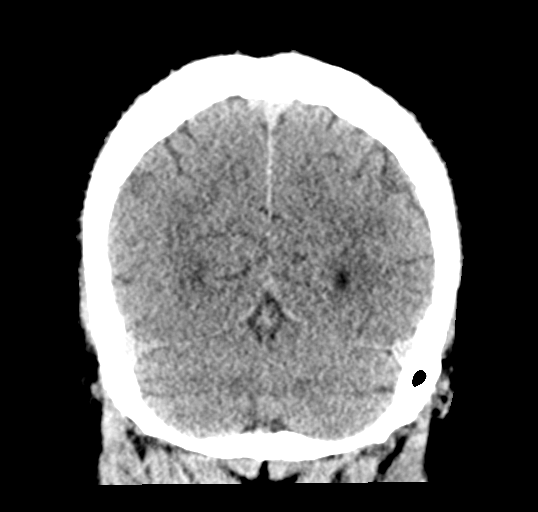
[im 35/78  brain]
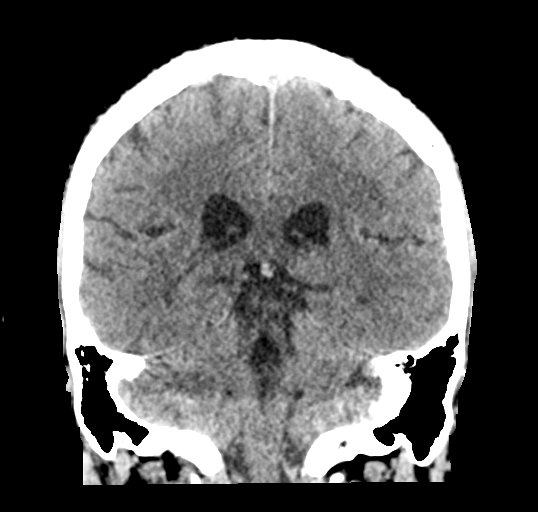
[im 43/78  brain]
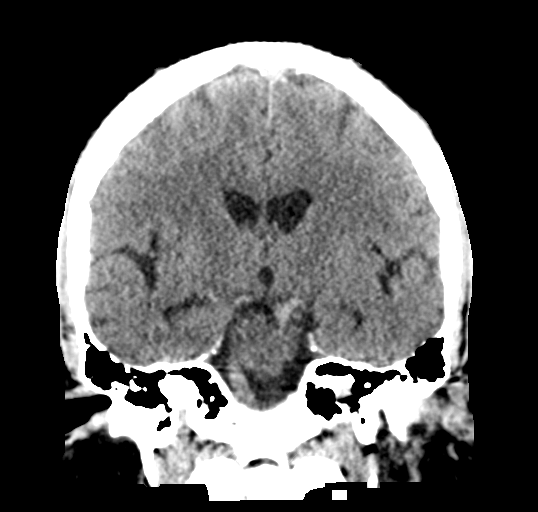

[Series 6: head without sag · sagittal · non-contrast · 0.34mm/px · 3 of 62 slices shown]
[im 21/62  brain]
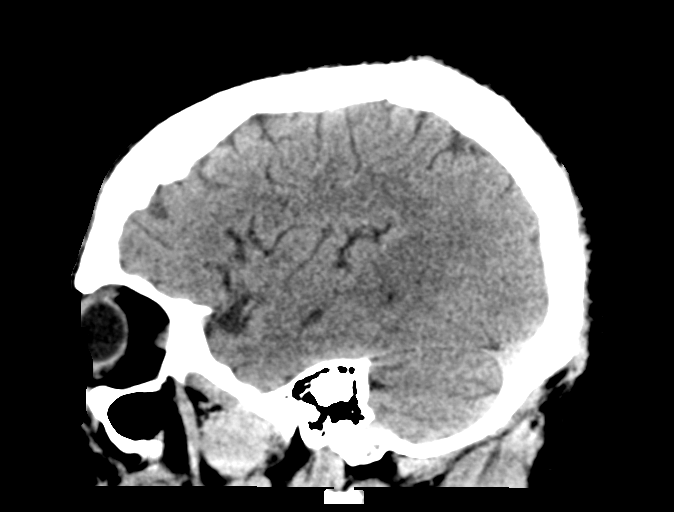
[im 31/62  brain]
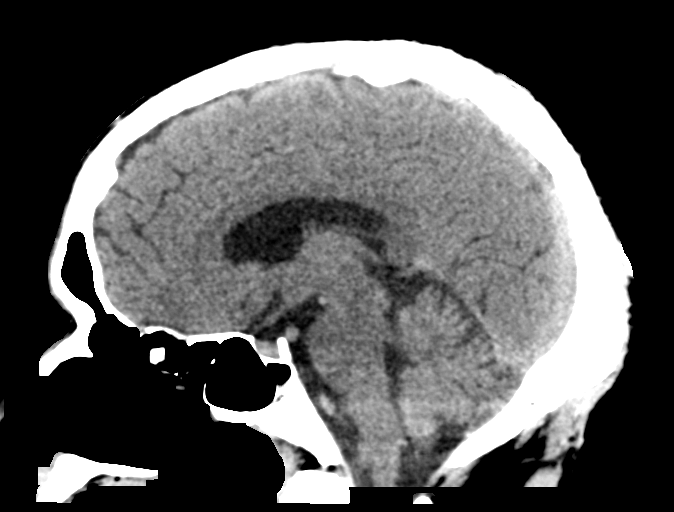
[im 41/62  brain]
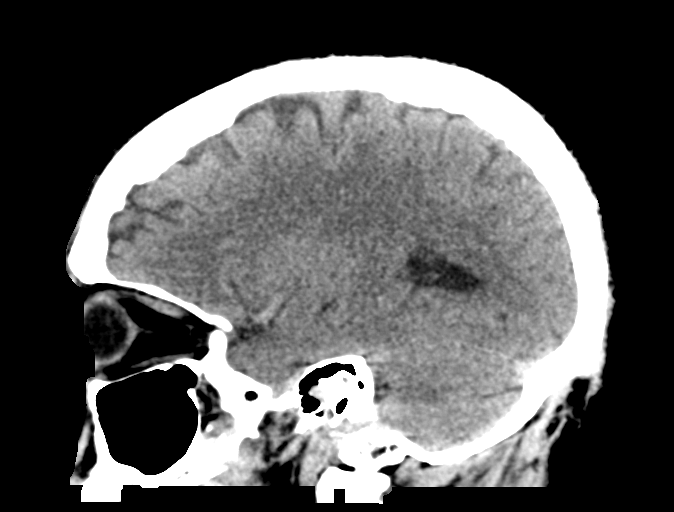

[13 of 47 positions shown; findings below may reference images not displayed]

FINDINGS: Brain: Ventricles are normal in size and configuration. There is no
intracranial mass, hemorrhage, extra-axial fluid collection, or
midline shift. Brain parenchyma appears unremarkable. No evident
acute infarct.

Vascular: There is no hyperdense vessel. There is no appreciable
vascular calcification.

Skull: The bony calvarium appears intact.

Sinuses/Orbits: There is mucosal thickening in several ethmoid air
cells. Other paranasal sinuses are clear. Orbits appear symmetric
bilaterally.

Other: Mastoid air cells are clear. There is an apparent sebaceous
cyst in the posterior midline region at the upper occipital level,
partially calcified, measuring 9 x 8 mm.
IMPRESSION: Mucosal thickening in several ethmoid air cells. Stable small
apparent sebaceous cyst posteriorly in the midline. Study otherwise
unremarkable. Note that the brain parenchyma appears unremarkable.

ADDENDUM:
The sebaceous cyst is in the posterior scalp region.

*** End of Addendum ***
FINDINGS: Brain: Ventricles are normal in size and configuration. There is no
intracranial mass, hemorrhage, extra-axial fluid collection, or
midline shift. Brain parenchyma appears unremarkable. No evident
acute infarct.

Vascular: There is no hyperdense vessel. There is no appreciable
vascular calcification.

Skull: The bony calvarium appears intact.

Sinuses/Orbits: There is mucosal thickening in several ethmoid air
cells. Other paranasal sinuses are clear. Orbits appear symmetric
bilaterally.

Other: Mastoid air cells are clear. There is an apparent sebaceous
cyst in the posterior midline region at the upper occipital level,
partially calcified, measuring 9 x 8 mm.
IMPRESSION: Mucosal thickening in several ethmoid air cells. Stable small
apparent sebaceous cyst posteriorly in the midline. Study otherwise
unremarkable. Note that the brain parenchyma appears unremarkable.

## 2019-04-02 NOTE — Patient Instructions (Addendum)
Consider downloading My Sugr app for downloading the data from your glucose monitor. Consider using your blood sugar readings to help you decide if your changes are helping to bring your blood sugar into a controled range. Aim to eat balanced meals and snacks.  Rethink your drink as well as your snacks If you need something on the road consider having nutritional supplement. Contact your doctor about to clarify your medications

## 2019-04-02 NOTE — Progress Notes (Signed)
Diabetes Self-Management Education  Visit Type: First/Initial  Appt. Start Time: 1410 Appt. End Time: 6568  04/03/2019  [name], identified by name and date of birth, is a 58 y.o. male with a diagnosis of Diabetes: Type 2.   ASSESSMENT  There were no vitals taken for this visit. There is no height or weight on file to calculate BMI.   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 This morning FBG 148 mg/dL. Pt reports after work he ate big mac, no sides, drank water, slept for 2-3 hrs, checked BG when he woke up was 238 mg/dL  Patient states he was diagnosed with T2DM right before surgery on R rotar cuff and he remembers his blood sugar was >400 mg/dL.   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 Pt states he lost 72 lbs by controlling his portion sizes and cut out all condiments for awhile and drinking a lot of water. Pt states he has gained 15-20 lbs back over the last 15 yrs.    Pt states his dietary choices are limited due to no teeth and hurts to eat with dentures because hurts when food gets caught between gum and dentures. Pt states he doesn't use dentures at all any more. Pt states he can chew softer meats such as chicken sandwiches and hamburgers.  Pt states he has been looking for drop-ins recently because he knows he should drink more water but it tastes bland.  Pt states he enjoys cooking but has been relying more on fast food recently. 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 Often goes to work 9 pm, makes trips to [place] or [place] lately. Eats when he gets off work then goes to sleep for a few hours.   Sleep: 5-6 hrs. Pt reports he has never needed much sleep since he was 58 yrs old. Patient appeared sleepy during visit,  but denied being sleep and states people tell him that often but states he feels wide awake.   Physical activity: Pt states his work used to be more active with unloading trucks, now he is mostly sedentary.  Pt also c/o of scaly, itchy skin also is lighter on portion of fingers on his left hand and darker knuckles on both hands. Pt states he did not have enough time to cover this with his MD at last visit. RD suggested he see a dermatologist. Patient thinks it started about the same time he started metformin. Pt states he has always had dry feet. RD may want to cover foot care in more detail at next visit.   Diabetes Self-Management Education - 04/02/19 1426      Visit Information   Visit Type  First/Initial      Initial Visit   Diabetes Type  Type 2    Are you currently following a meal plan?  No    Are you taking your medications as prescribed?  Yes   metformin 500 BID   Date Diagnosed  09/2017      Health Coping   How would you rate your overall health?  Good      Psychosocial Assessment   Patient Belief/Attitude about Diabetes  --   would like to minimize amt of medication he takes   How often  do you need to have someone help you when you read instructions, pamphlets, or other written materials from your doctor or pharmacy?  1 - Never    What is the last grade level you completed in school?  some college      Complications   Last HgB A1C per patient/outside source  7.3 %    How often do you check your blood sugar?  1-2 times/day    Fasting Blood glucose range (mg/dL)  130-179   148   Postprandial Blood glucose range (mg/dL)  >200   238   Number of hypoglycemic episodes per month  0    Have you had a dilated eye exam in the past 12 months?  No    Have you had a dental exam in the past 12 months?  No    Are you checking your feet?  Yes    How many days per week are you checking your feet?  2      Dietary Intake   Breakfast  eggs, cheese, toast, meat, was drinking juice  until a few days ago    Snack (morning)  snack cakes, cheese popcorn, cookies, applesauce, pudding    Lunch  Sandwich or soup OR buger or chiceken sandwich    Snack (afternoon)  may be some of same above    Dinner  (only 2 meals)    Beverage(s)  water, regular soda: sprite, ginger ale      Exercise   Exercise Type  ADL's      Patient Education   Previous Diabetes Education  No    Nutrition management   Role of diet in the treatment of diabetes and the relationship between the three main macronutrients and blood glucose level    Physical activity and exercise   Role of exercise on diabetes management, blood pressure control and cardiac health.    Medications  Reviewed patients medication for diabetes, action, purpose, timing of dose and side effects.    Monitoring  Identified appropriate SMBG and/or A1C goals.      Individualized Goals (developed by patient)   Nutrition  General guidelines for healthy choices and portions discussed    Monitoring   test my blood glucose as discussed      Outcomes   Expected Outcomes  Demonstrated interest in learning. Expect positive outcomes    Future DMSE  4-6 wks    Program Status  Completed       Individualized Plan for Diabetes Self-Management Training:   Learning Objective:  Patient will have a greater understanding of diabetes self-management. Patient education plan is to attend individual and/or group sessions per assessed needs and concerns.   Plan:   Patient Instructions  Consider downloading My Sugr app for downloading the data from your glucose monitor. Consider using your blood sugar readings to help you decide if your changes are helping to bring your blood sugar into a controled range. Aim to eat balanced meals and snacks.  Rethink your drink as well as your snacks If you need something on the road consider having nutritional supplement. Contact your doctor about to clarify your medications   Expected Outcomes:  Demonstrated  interest in learning. Expect positive outcomes  Education material provided: ADA - How to Thrive: A Guide for Your Journey with Diabetes and A1C conversion sheet  If problems or questions, patient to contact team via:  Phone and MyChart  Future DSME appointment: 4-6 wks

## 2019-04-03 ENCOUNTER — Ambulatory Visit: Payer: Self-pay

## 2019-04-03 ENCOUNTER — Telehealth: Payer: Self-pay | Admitting: Internal Medicine

## 2019-04-03 NOTE — Telephone Encounter (Signed)
-----   Message from Curtis Williamson, Curtis Williamson sent at 04/03/2019  2:36 PM EST ----- Regarding: Pt confused about medications Hi Dr. Wynetta Emery,  Your patient, Curtis Williamson, asked me to send you a note because he is not sure about some of his medications. He told me he is taking Lipitor which he believes is a blood pressure medicine to protect his kidneys. He stated he doesn't know anything about the lisinopril-hctz.  He also stated he was not able to pick up Chantix because the pharmacist told him medicaid wouldn't pay for the prescribed dose. He wanted me to send you a note because he is not sure how to ask about his confusion about his medications.  Thank you for responding to my last message. I feel fortunate to have an hour with patients to just focus on diabetes and glad that I can sum up things I feel you would be important for you to know.  Curtis Williamson, Curtis Williamson

## 2019-04-04 ENCOUNTER — Telehealth: Payer: Self-pay | Admitting: Pharmacist

## 2019-04-04 NOTE — Telephone Encounter (Signed)
Call placed to patient. Verified I was speaking to him using two patient identifiers.   Medication reconciliation performed over the phone and problems identified include:  - non-adherence to atorvastatin; does not know purpose of medication and has not picked up from his pharmacy - insurance not wanting to cover Chantix - does not know purpose of lisinopril-HCTZ  I offered counseling regarding therapeutics and role of atorvastatin. Pt is amenable to take and has no further questions. He verbalizes understanding that his medication is for cholesterol and ASCVD prevention.   I offered counseling regarding therapeutics and role of lisinopril-HCTZ. Pt is amenable to take and has no further questions. He verbalizes understanding that this is for BP/renal protection.  Contacted his pharmacy to resolve issues. Per pharmacist, pt's Chantix is approved and ready for pick-up. Atorvastatin is ready for pick-up. All other medications have been picked up by Mr. Fullbright already. Informed patient of this - he will pick-up later today.   Benard Halsted, PharmD, [place] [phone]

## 2019-04-04 NOTE — Telephone Encounter (Signed)
Will contact patient today to schedule med rec.

## 2019-04-09 ENCOUNTER — Other Ambulatory Visit: Payer: Self-pay

## 2019-04-09 ENCOUNTER — Ambulatory Visit (INDEPENDENT_AMBULATORY_CARE_PROVIDER_SITE_OTHER): Payer: Medicaid Other

## 2019-04-09 VITALS — BP 98/64 | HR 88 | Resp 17

## 2019-04-09 DIAGNOSIS — I10 Essential (primary) hypertension: Secondary | ICD-10-CM | POA: Diagnosis not present

## 2019-04-09 NOTE — Progress Notes (Signed)
Patient here for BP check. After letting patient sit BP was 98/64, pulse was 88. Patient states that he hasn't taken BP medications today due to work schedule. Doesn't have a BP cuff to check BP at home. Denies chest pain, SHOB, palpitations, dizziness, lower extremity swelling, headaches. Patient made 3 month follow up. KWalker, CMA.

## 2019-04-29 ENCOUNTER — Ambulatory Visit: Payer: Medicaid Other | Admitting: Dietician

## 2019-05-01 ENCOUNTER — Other Ambulatory Visit: Payer: Medicaid Other

## 2019-05-01 ENCOUNTER — Other Ambulatory Visit: Payer: Self-pay

## 2019-05-01 DIAGNOSIS — R7989 Other specified abnormal findings of blood chemistry: Secondary | ICD-10-CM

## 2019-05-01 DIAGNOSIS — R945 Abnormal results of liver function studies: Secondary | ICD-10-CM

## 2019-05-01 NOTE — Progress Notes (Signed)
Patient here for repeat labs. 

## 2019-05-02 LAB — HEPATIC FUNCTION PANEL
ALT: 40 IU/L (ref 0–44)
AST: 25 IU/L (ref 0–40)
Albumin: 4.4 g/dL (ref 3.8–4.9)
Alkaline Phosphatase: 84 IU/L (ref 39–117)
Bilirubin Total: 0.2 mg/dL (ref 0.0–1.2)
Bilirubin, Direct: 0.09 mg/dL (ref 0.00–0.40)
Total Protein: 7.4 g/dL (ref 6.0–8.5)

## 2019-05-02 LAB — HEPATITIS C ANTIBODY: Hep C Virus Ab: <0.1 s/co ratio (ref 0.0–0.9)

## 2019-05-08 NOTE — Progress Notes (Signed)
NEUROLOGY CONSULTATION NOTE  Curtis Williamson MRN: [id number] DOB: 1960/06/16  Referring provider: Karle Plumber, MD Primary care provider: Karle Plumber, MD  Reason for consult:  Headache and cerebral aneurysm  HISTORY OF PRESENT ILLNESS: Curtis Williamson is a 59 year old right-handed male with hypertension, diabetes mellitus and tobacco use who presents for headaches and cerebral aneurysm.  History supplemented by referring provider note.  [place] in [place].  Around August, he started having headaches.  At first they were mild and infrequent, but gradually became more intense and frequent.  He presented to [facility] ED on 02/26/2019 for 3 days of intermittent dizziness and lightheadedness as well as severe pulsating left temporal and retro-orbital headache.  No associated slurred speech, facial droop or unilateral numbness or weakness.  He had been out of his Metformin for 3 days.  CTA of head and neck performed was personally reviewed and showed a 2 mm aneurysm vs infundibulum of the supraclinoid intracranial left ICA but no acute abnormality or large vessel high-grade stenosis, occlusion or dissection.  He was advised to follow up with his PCP regarding the CT finding. He had a repeat head CT on 04/02/2019 which showed no acute intracranial abnormality.  Headaches started to dissipate by end of December.  He reports decreased emotional stress.  Since then, he has had 3 mild headaches over the past month lasting up to 30 minutes.    He is not aware of any family history of aneurysms.  Of note, when he was still in [place], he had a couple of episodes of syncope.  They occurred during work rehabilitation (he had been out of work for a year due to shoulder surgery.  It was determined that they were vasovagal.  He has not had any recurrence since August.   PAST MEDICAL HISTORY: Past Medical History:  Diagnosis Date  . Diabetes mellitus without  complication ([place])   . Essential hypertension     PAST SURGICAL HISTORY: Past Surgical History:  Procedure Laterality Date  . KIDNEY DONATION    . MANDIBLE FRACTURE SURGERY    . ROTATOR CUFF REPAIR Right     MEDICATIONS: Current Outpatient Medications on File Prior to Visit  Medication Sig Dispense Refill  . Accu-Chek FastClix Lancets MISC Use to check FSBS twice a day. Dx: E11.9 102 each 2  . atorvastatin (LIPITOR) 10 MG tablet Take 1 tablet (10 mg total) by mouth daily. (Patient not taking: Reported on 04/04/2019) 30 tablet 4  . Blood Glucose Monitoring Suppl (ACCU-CHEK GUIDE ME) w/Device KIT 1 each by Does not apply route 2 (two) times daily. Use to check FSBS twice a day. Dx: E11.9 1 kit 0  . glucose blood (ACCU-CHEK GUIDE) test strip Use to check FSBS twice a day. Dx: E11.9 100 each 2  . hydrOXYzine (ATARAX/VISTARIL) 25 MG tablet TAKE 1 TABLET (25 MG TOTAL) BY MOUTH EVERY 6 (SIX) HOURS. 120 tablet 0  . lisinopril-hydrochlorothiazide (ZESTORETIC) 10-12.5 MG tablet Take 1 tablet by mouth daily. 30 tablet 4  . metFORMIN (GLUCOPHAGE) 500 MG tablet Take 1 tablet (500 mg total) by mouth 2 (two) times daily with a meal. 60 tablet 6  . varenicline (CHANTIX) 0.5 MG tablet Take 1 tablet (0.5 mg total) by mouth 2 (two) times daily. (Patient not taking: Reported on 04/04/2019) 60 tablet 0   No current facility-administered medications on file prior to visit.    ALLERGIES: Allergies  Allergen Reactions  . Mushroom Extract Complex  FAMILY HISTORY: Family History  Problem Relation Age of Onset  . Hypertension Mother   . Kidney failure Mother   . Kidney disease Mother   . Heart disease Mother   . ADD / ADHD Daughter   . Anxiety disorder Daughter   . Alcohol abuse Father      SOCIAL HISTORY: Social History   Socioeconomic History  . Marital status: Single    Spouse name: Not on file  . Number of children: 1  . Years of education: Not on file  . Highest education level:  Some college, no degree  Occupational History  . Occupation: Truck Geophysicist/field seismologist  Tobacco Use  . Smoking status: Current Every Day Smoker  . Smokeless tobacco: Never Used  Substance and Sexual Activity  . Alcohol use: Yes  . Drug use: Not on file  . Sexual activity: Not on file  Other Topics Concern  . Not on file  Social History Narrative  . Not on file   Social Determinants of Health   Financial Resource Strain:   . Difficulty of Paying Living Expenses: Not on file  Food Insecurity:   . Worried About Charity fundraiser in the Last Year: Not on file  . Ran Out of Food in the Last Year: Not on file  Transportation Needs:   . Lack of Transportation (Medical): Not on file  . Lack of Transportation (Non-Medical): Not on file  Physical Activity:   . Days of Exercise per Week: Not on file  . Minutes of Exercise per Session: Not on file  Stress:   . Feeling of Stress : Not on file  Social Connections:   . Frequency of Communication with Friends and Family: Not on file  . Frequency of Social Gatherings with Friends and Family: Not on file  . Attends Religious Services: Not on file  . Active Member of Clubs or Organizations: Not on file  . Attends Archivist Meetings: Not on file  . Marital Status: Not on file  Intimate Partner Violence:   . Fear of Current or Ex-Partner: Not on file  . Emotionally Abused: Not on file  . Physically Abused: Not on file  . Sexually Abused: Not on file    REVIEW OF SYSTEMS: Constitutional: No fevers, chills, or sweats, no generalized fatigue, change in appetite Eyes: No visual changes, double vision, eye pain Ear, nose and throat: No hearing loss, ear pain, nasal congestion, sore throat Cardiovascular: No chest pain, palpitations Respiratory:  No shortness of breath at rest or with exertion, wheezes GastrointestinaI: No nausea, vomiting, diarrhea, abdominal pain, fecal incontinence Genitourinary:  No dysuria, urinary retention or  frequency Musculoskeletal:  No neck pain, back pain Integumentary: No rash, pruritus, skin lesions Neurological: as above Psychiatric: No depression, insomnia, anxiety Endocrine: No palpitations, fatigue, diaphoresis, mood swings, change in appetite, change in weight, increased thirst Hematologic/Lymphatic:  No purpura, petechiae. Allergic/Immunologic: no itchy/runny eyes, nasal congestion, recent allergic reactions, rashes  PHYSICAL EXAM: Blood pressure 118/77, pulse (!) 103, height 5' 11"  (1.803 m), weight 207 lb (93.9 kg), SpO2 98 %. General: No acute distress.  Patient appears well-groomed.  Head:  Normocephalic/atraumatic Eyes:  fundi examined but not visualized Neck: supple, no paraspinal tenderness, full range of motion Back: No paraspinal tenderness Heart: regular rate and rhythm Lungs: Clear to auscultation bilaterally. Vascular: No carotid bruits. Neurological Exam: Mental status: alert and oriented to person, place, and time, recent and remote memory intact, fund of knowledge intact, attention and concentration intact, speech  fluent and not dysarthric, language intact. Cranial nerves: CN I: not tested CN II: pupils equal, round and reactive to light, visual fields intact CN III, IV, VI:  full range of motion, no nystagmus, no ptosis CN V: facial sensation intact CN VII: upper and lower face symmetric CN VIII: hearing intact CN IX, X: gag intact, uvula midline CN XI: sternocleidomastoid and trapezius muscles intact CN XII: tongue midline Bulk & Tone: normal, no fasciculations. Motor:  5/5 throughout  Sensation: temperature and vibration sensation intact. Deep Tendon Reflexes:  2+ throughout, toes downgoing.  Finger to nose testing:  Without dysmetria.  Heel to shin:  Without dysmetria.  Gait:  Normal station and stride.  Able to turn and tandem walk. Romberg negative.  IMPRESSION: 1.  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  Incidental finding.  Such a small  aneurysm in the anterior circulation has virtually no risk of rupture over 5 years.  However, it will require monitoring. 2.  Headache, unspecified.  Improved.  PLAN: 1.  Surveillance.  We will check MRA of head in 6 months.  If stable, I would check an MRA of head annually every year for the next 3 years.  If still stable, then would be able to repeat in 5 yars. 2.  Smoking cessation.  On Chantix.    Thank you for allowing me to take part in the care of this patient.  Metta Clines, DO  CC: Karle Plumber, MD

## 2019-05-09 ENCOUNTER — Encounter: Payer: Self-pay | Admitting: Neurology

## 2019-05-09 ENCOUNTER — Other Ambulatory Visit: Payer: Self-pay

## 2019-05-09 ENCOUNTER — Ambulatory Visit (INDEPENDENT_AMBULATORY_CARE_PROVIDER_SITE_OTHER): Payer: Medicaid Other | Admitting: Neurology

## 2019-05-09 VITALS — BP 118/77 | HR 103 | Ht 71.0 in | Wt 207.0 lb

## 2019-05-09 DIAGNOSIS — I671 Cerebral aneurysm, nonruptured: Secondary | ICD-10-CM | POA: Diagnosis not present

## 2019-05-09 DIAGNOSIS — R519 Headache, unspecified: Secondary | ICD-10-CM | POA: Diagnosis not present

## 2019-05-09 NOTE — Patient Instructions (Addendum)
1.  Repeat MRA of head in 6 months.  Follow up with me afterwards. We have sent a referral to [facility] for your MRA and they will call you directly to schedule your appointment in 6 months. They are located at [address] [facility]. If you need to contact them directly please call [phone].

## 2019-05-14 ENCOUNTER — Encounter: Payer: Self-pay | Admitting: Registered"

## 2019-05-14 ENCOUNTER — Other Ambulatory Visit: Payer: Self-pay

## 2019-05-14 ENCOUNTER — Encounter: Payer: Medicaid Other | Attending: Internal Medicine | Admitting: Registered"

## 2019-05-14 ENCOUNTER — Telehealth: Payer: Self-pay | Admitting: General Practice

## 2019-05-14 DIAGNOSIS — E119 Type 2 diabetes mellitus without complications: Secondary | ICD-10-CM | POA: Diagnosis not present

## 2019-05-14 MED ORDER — ACCU-CHEK SOFT TOUCH LANCETS MISC: 2 refills | Status: DC

## 2019-05-14 NOTE — Patient Instructions (Signed)
Continue your recent changes including: Drinking more water,  Getting more exercise Cutting down the sweets  When eating bananas consider checking your blood sugar 90-120 min after eating more than 1. Might be helpful to eat some peanut butter with it to get protein.  Consider following up with your doctor about more supplies to check your blood sugar.  Fasting or 3-4 hr after eating should give an idea how low your blood sugar goes. 80-130 is the goal Checking about 2 hrs after meals less than 180 is the goal.

## 2019-05-14 NOTE — Telephone Encounter (Signed)
Error

## 2019-05-14 NOTE — Progress Notes (Signed)
Diabetes Self-Management Education  Visit Type: Follow-up  Appt. Start Time: 1400 Appt. End Time: 1430  05/14/2019  [name], identified by name and date of birth, is a 59 y.o. male with a diagnosis of Diabetes: Type 2.   ASSESSMENT  There were no vitals taken for this visit. There is no height or weight on file to calculate BMI.   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 Pt has increased his water intake a lot. Pt states finding different flavor of drop-ins has helped. Pt states he was binging on sweets and has cut way back.  SMBG: Pt states his pharmacist could not give him the correct lancets for his device and he is waiting for his doctor to call in correct Rx. RD contacted MD office and Rx has been changed, called Pt 05/15/19 and LVM with update.  Pt states he was told that he has low potassium and has started eating a lot of bananas which helped his lab work. Pt reports eating 3-4 per day.  Pt continues to get minimal sleep. Pt asked about melatonin. RD briefly described action and potential use considering his work schedule.  RD asked patient to return after getting an updated A1c unless if after he starts checking BG and seeing readings that he would like to go over before then.  Diabetes Self-Management Education - 05/14/19 1539      Visit Information   Visit Type  Follow-up      Initial Visit   Diabetes Type  Type 2      Exercise   Exercise Type  ADL's   increased NEAT     Individualized Goals (developed by patient)   Nutrition  General guidelines for healthy choices and portions discussed    Physical Activity  --   continue increased NEAT   Monitoring   Other (comment)   pick up lancets and resume testing     Patient Self-Evaluation of Goals - Patient rates self as meeting previously set goals (% of time)   Nutrition  50 - 75 %      Outcomes   Expected Outcomes  Demonstrated  interest in learning. Expect positive outcomes    Future DMSE  2 months    Program Status  Completed      Subsequent Visit   Since your last visit, are you checking your blood glucose at least once a day?  No   wrong lancets were prescribed      Individualized Plan for Diabetes Self-Management Training:   Learning Objective:  Patient will have a greater understanding of diabetes self-management. Patient education plan is to attend individual and/or group sessions per assessed needs and concerns.    Patient Instructions  Continue your recent changes including: Drinking more water,  Getting more exercise Cutting down the sweets  When eating bananas consider checking your blood sugar 90-120 min after eating more than 1. Might be helpful to eat some peanut butter with it to get protein.  Consider following up with your doctor about more supplies to check your blood sugar.  Fasting or 3-4 hr after eating should give an idea how low your blood sugar goes. 80-130 is the goal Checking about 2 hrs after meals less than 180 is the goal.   Expected Outcomes:  Demonstrated interest in learning. Expect positive outcomes  Education material provided: none  If problems or questions, patient to contact team via:  Phone,  MyChart  Future DSME appointment: 2 months

## 2019-05-16 ENCOUNTER — Ambulatory Visit: Payer: Medicaid Other | Admitting: Neurology

## 2019-07-09 ENCOUNTER — Ambulatory Visit: Payer: Medicaid Other

## 2019-07-11 ENCOUNTER — Ambulatory Visit (INDEPENDENT_AMBULATORY_CARE_PROVIDER_SITE_OTHER): Payer: 59 | Admitting: Internal Medicine

## 2019-07-11 ENCOUNTER — Encounter: Payer: Self-pay | Admitting: Internal Medicine

## 2019-07-11 DIAGNOSIS — Z716 Tobacco abuse counseling: Secondary | ICD-10-CM | POA: Diagnosis not present

## 2019-07-11 DIAGNOSIS — E119 Type 2 diabetes mellitus without complications: Secondary | ICD-10-CM | POA: Diagnosis not present

## 2019-07-11 DIAGNOSIS — F17210 Nicotine dependence, cigarettes, uncomplicated: Secondary | ICD-10-CM | POA: Diagnosis not present

## 2019-07-11 DIAGNOSIS — M545 Low back pain, unspecified: Secondary | ICD-10-CM

## 2019-07-11 DIAGNOSIS — I10 Essential (primary) hypertension: Secondary | ICD-10-CM | POA: Diagnosis not present

## 2019-07-11 DIAGNOSIS — L309 Dermatitis, unspecified: Secondary | ICD-10-CM

## 2019-07-11 DIAGNOSIS — R0981 Nasal congestion: Secondary | ICD-10-CM

## 2019-07-11 DIAGNOSIS — F17200 Nicotine dependence, unspecified, uncomplicated: Secondary | ICD-10-CM

## 2019-07-11 DIAGNOSIS — Z7984 Long term (current) use of oral hypoglycemic drugs: Secondary | ICD-10-CM

## 2019-07-11 MED ORDER — TRIAMCINOLONE ACETONIDE 0.1 % EX CREA: 1.0000 "application " | TOPICAL_CREAM | Freq: Two times a day (BID) | CUTANEOUS | 2 refills | Status: DC

## 2019-07-11 MED ORDER — FLUTICASONE PROPIONATE 50 MCG/ACT NA SUSP: 2.0000 | Freq: Every day | NASAL | 6 refills | Status: DC

## 2019-07-11 NOTE — Progress Notes (Addendum)
Virtual Visit via Telephone Note  I connected with Curtis Williamson, on 07/11/2019 at 2:36 PM by telephone due to the COVID-19 pandemic and verified that I am speaking with the correct person using two identifiers.   Consent: I discussed the limitations, risks, security and privacy concerns of performing an evaluation and management service by telephone and the availability of in person appointments. I also discussed with the patient that there may be a patient responsible charge related to this service. The patient expressed understanding and agreed to proceed.   Location of Patient: Home   Location of Provider: Clinic    Persons participating in Telemedicine visit: Seanpatrick Maisano [facility] Dr. Juleen China      History of Present Illness: Patient has a visit to follow up on chronic medical conditions.   Diabetes mellitus, Type 2 Disease Monitoring             Blood Sugar Ranges: Fasting - Has not been checking CBGs because he lost meter. Has found it again.              Polyuria: No              Visual problems: no   Urine Microalbumin <3 (Dec 2020)   Last A1C: 7.3 (Dec 2020)   Medications: Metformin 500 mg BID  Medication Compliance: yes  Medication Side Effects             Hypoglycemia: no   Chronic HTN Disease Monitoring:  Home BP Monitoring - Does not monitor  Chest pain- no  Dyspnea- no Headache - no  Medications: Lisinopril 10 mg, HCTZ 12.5 mg  Compliance- yes Lightheadedness- no  Edema- no    Low Back Pain: Present in low back and radiates to right buttocks. Occurs when stands too long or sits in certain position. It will start to ache. Has been occurring for the past couple months. Varies in severity. Sometimes mild but sometimes in so much pain he will have to lean over the shopping cart or use the motor cart. Epsom salt baths seem to help. He can't find his heating pad because he just moved a couple weeks ago. Also occasionally  takes ASA or ibuprofen.   Rash present. Itchy, dry patches present on various aspects of his arms and on his hands.    Past Medical History:  Diagnosis Date  . Diabetes mellitus without complication ([place])   . Essential hypertension    Allergies  Allergen Reactions  . Mushroom Extract Complex     Current Outpatient Medications on File Prior to Visit  Medication Sig Dispense Refill  . atorvastatin (LIPITOR) 10 MG tablet Take 1 tablet (10 mg total) by mouth daily. 30 tablet 4  . Blood Glucose Monitoring Suppl (ACCU-CHEK GUIDE ME) w/Device KIT 1 each by Does not apply route 2 (two) times daily. Use to check FSBS twice a day. Dx: E11.9 1 kit 0  . glucose blood (ACCU-CHEK GUIDE) test strip Use to check FSBS twice a day. Dx: E11.9 100 each 2  . hydrOXYzine (ATARAX/VISTARIL) 25 MG tablet TAKE 1 TABLET (25 MG TOTAL) BY MOUTH EVERY 6 (SIX) HOURS. 120 tablet 0  . Lancets (ACCU-CHEK SOFT TOUCH) lancets Use to check FSBS twice a day. Dx: E11.9 100 each 2  . lisinopril-hydrochlorothiazide (ZESTORETIC) 10-12.5 MG tablet Take 1 tablet by mouth daily. 30 tablet 4  . metFORMIN (GLUCOPHAGE) 500 MG tablet Take 1 tablet (500 mg total) by mouth 2 (two) times daily with a meal. 60 tablet  6  . varenicline (CHANTIX) 0.5 MG tablet Take 1 tablet (0.5 mg total) by mouth 2 (two) times daily. 60 tablet 0   No current facility-administered medications on file prior to visit.    Observations/Objective: NAD. Speaking clearly.  Work of breathing normal.  Alert and oriented. Mood appropriate.   Assessment and Plan: 1. Tobacco dependence Has not yet started Chantix. Patient currently smokes 4-5 cigarettes, reports he used to smoke less 2-3 cigarettes. Spent 3 minutes counseling on dangers of tobacco use and benefits of quitting, offered pharmacological intervention to aid quitting and patient is ready to quit but has a lot of things going on with recent move, family concerns, etc. Worried about starting before he  "can put forth best effort to quit".   2. Essential hypertension Unsure if BP at goal. No concerning symptoms of hypertensive urgency. Most recent BP at prior office visit was well within goal.  Counseled on blood pressure goal of less than 130/80, low-sodium, DASH diet, medication compliance, 150 minutes of moderate intensity exercise per week. Discussed medication compliance, adverse effects.  3. Type 2 diabetes mellitus without complication, without long-term current use of insulin (HCC) Last A1c slightly above goal. Not currently  monitoring fasting CBGs. Continue Metformin.  Counseled on Diabetic diet, my plate method, 638 minutes of moderate intensity exercise/week Blood sugar logs with fasting goals of 80-120 mg/dl, random of less than 180 and in the event of sugars less than 60 mg/dl or greater than 400 mg/dl encouraged to notify the clinic. Advised on the need for annual eye exams, annual foot exams, Pneumonia vaccine. - Hemoglobin A1c; Future  4. Acute right-sided low back pain, unspecified whether sciatica present No prior imaging. Will obtain to evaluate for DJD or spinal stenosis. Location of pain could also be consistent with piriformis syndrome. Would likely benefit from PT, will await imaging results prior to placing referral.  - DG Lumbar Spine Complete; Future  5. Eczema, unspecified type Sounds consistent with eczema. Will treat with topical steroid.  - triamcinolone cream (KENALOG) 0.1 %; Apply 1 application topically 2 (two) times daily.  Dispense: 30 g; Refill: 2  6. Nasal congestion Reports chronic nasal congestion and trouble with breathing at nighttime. Does report had nasal fracture many years ago. Will try nasal  corticosteroid to decrease potential inflammation. May have some anatomical abnormality that is impacting nasal airflow due to remote h/o nasal fracture.  - fluticasone (FLONASE) 50 MCG/ACT nasal spray; Place 2 sprays into both nostrils daily.  Dispense: 16  g; Refill: 6    Follow Up Instructions: Lab and imaging visit 3/26    I discussed the assessment and treatment plan with the patient. The patient was provided an opportunity to ask questions and all were answered. The patient agreed with the plan and demonstrated an understanding of the instructions.   The patient was advised to call back or seek an in-person evaluation if the symptoms worsen or if the condition fails to improve as anticipated.   I provided 22 minutes total of non-face-to-face time during this encounter including median intraservice time, reviewing previous notes, investigations, ordering medications, medical decision making, coordinating care and patient verbalized understanding at the end of the visit.    Phill Myron, D.O. Primary Care at [facility]  07/11/2019, 2:36 PM

## 2019-07-12 ENCOUNTER — Other Ambulatory Visit: Payer: Medicaid Other

## 2019-07-12 ENCOUNTER — Other Ambulatory Visit: Payer: Self-pay

## 2019-07-12 ENCOUNTER — Ambulatory Visit (INDEPENDENT_AMBULATORY_CARE_PROVIDER_SITE_OTHER): Payer: 59

## 2019-07-12 DIAGNOSIS — M545 Low back pain, unspecified: Secondary | ICD-10-CM

## 2019-07-12 DIAGNOSIS — E119 Type 2 diabetes mellitus without complications: Secondary | ICD-10-CM

## 2019-07-12 IMAGING — DX DG LUMBAR SPINE COMPLETE 4+V
5 series · 5 of 5 positions shown · non-contrast
Comparison: None.

CLINICAL DATA: Chronic right-sided low back pain for 9 months. Pain
radiates into the right hip. Buttock pain.

EXAM:
LUMBAR SPINE - COMPLETE 4+ VIEW

[lumbar spine ap]
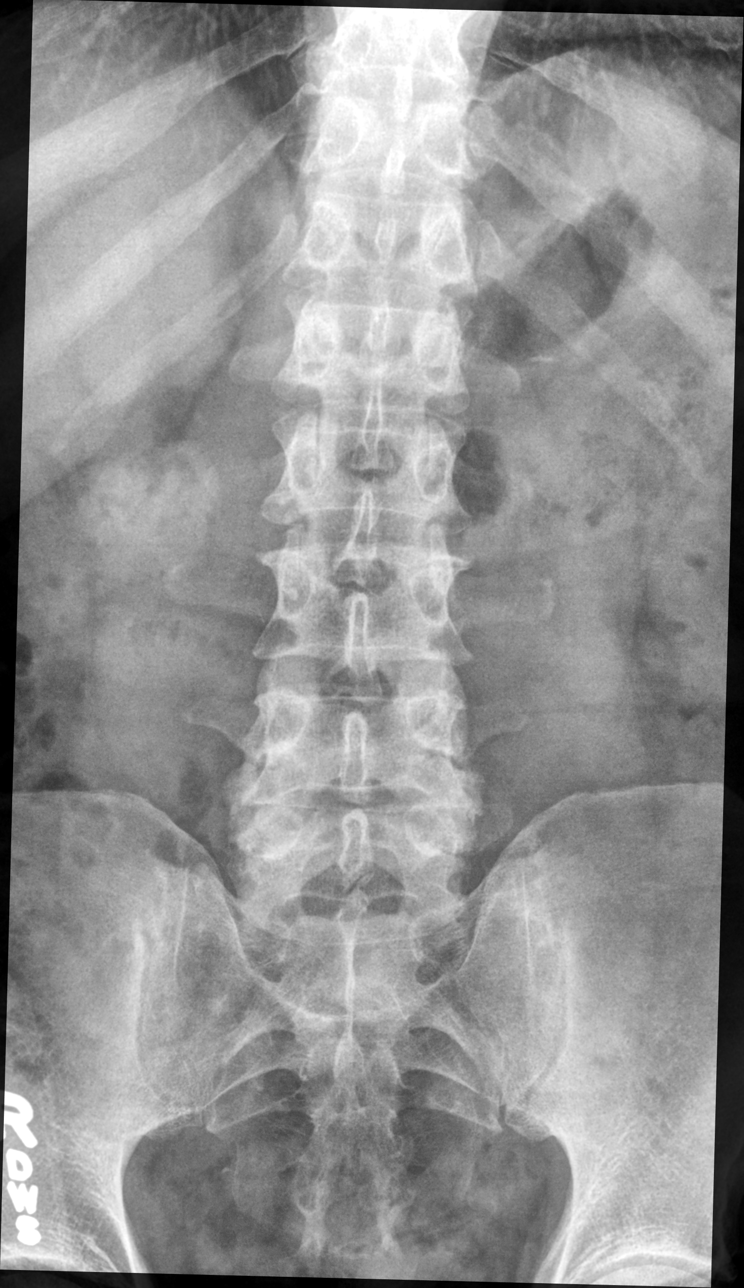

[lumbar spine oblique supine (1 of 2)]
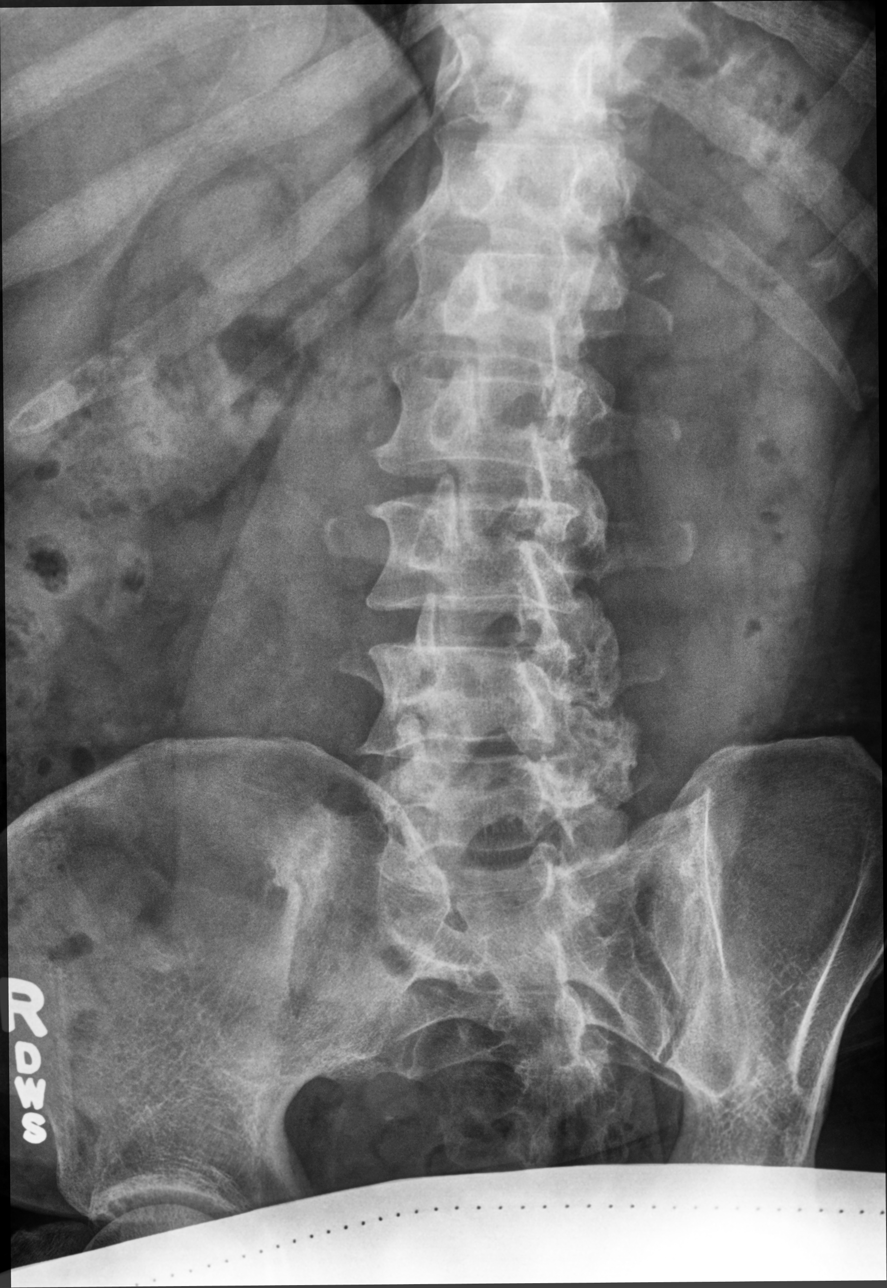

[lumbar spine oblique supine (2 of 2)]
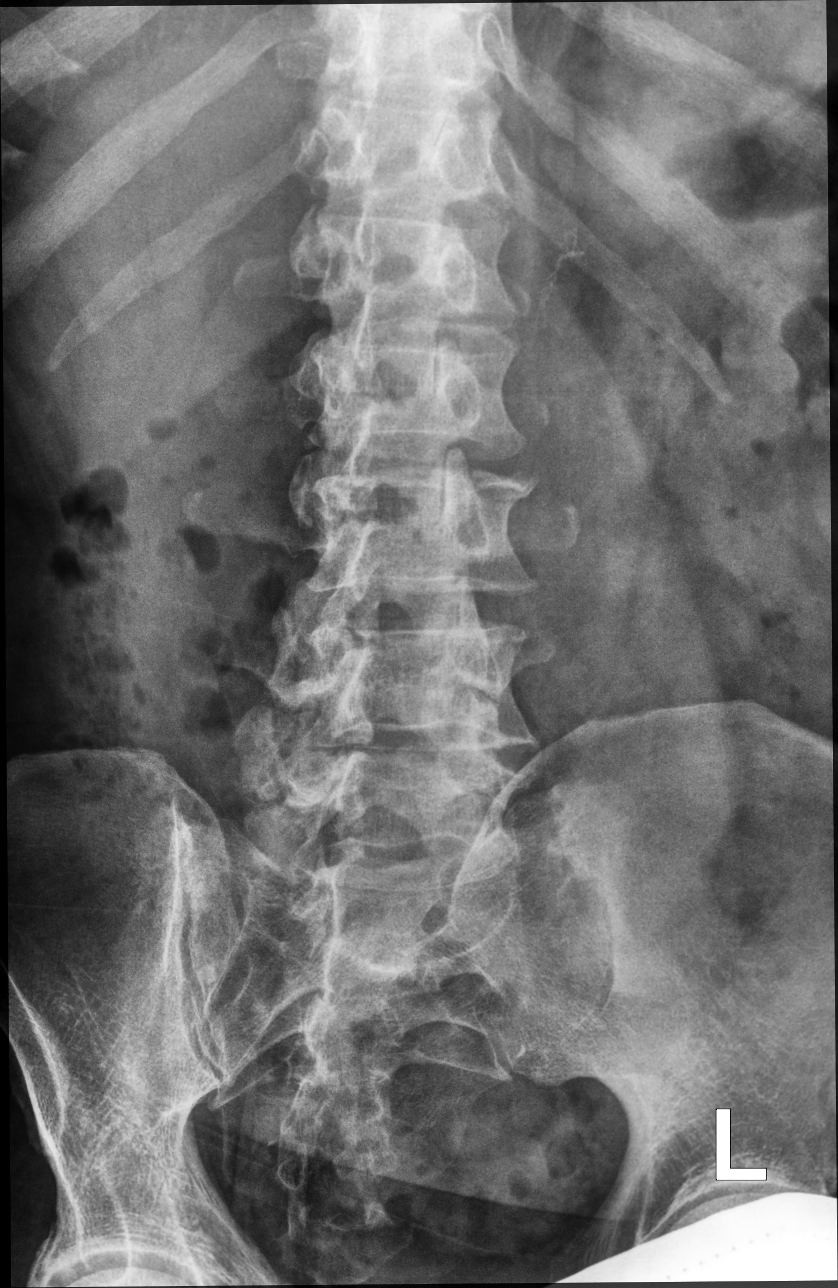

[lumbar spine lat (1 of 2)]
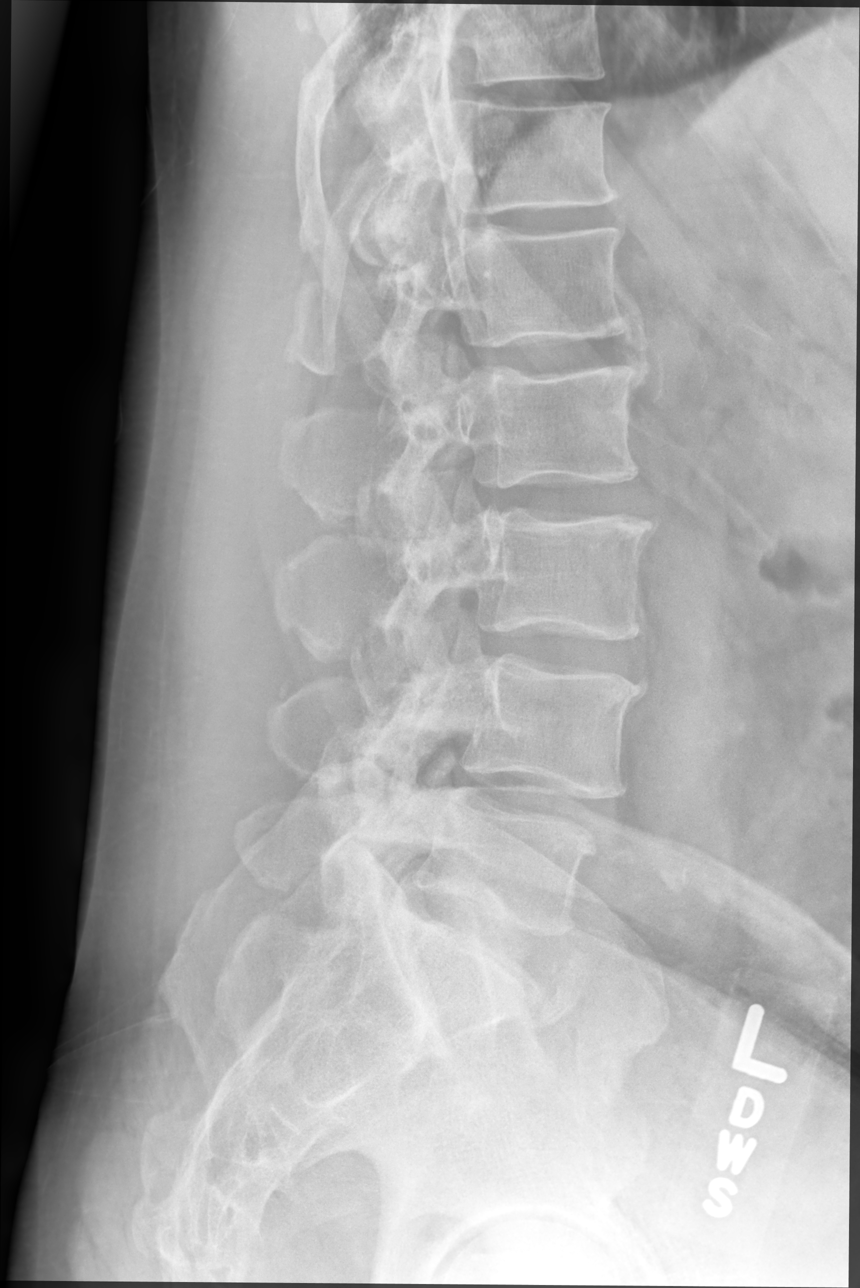

[lumbar spine lat (2 of 2)]
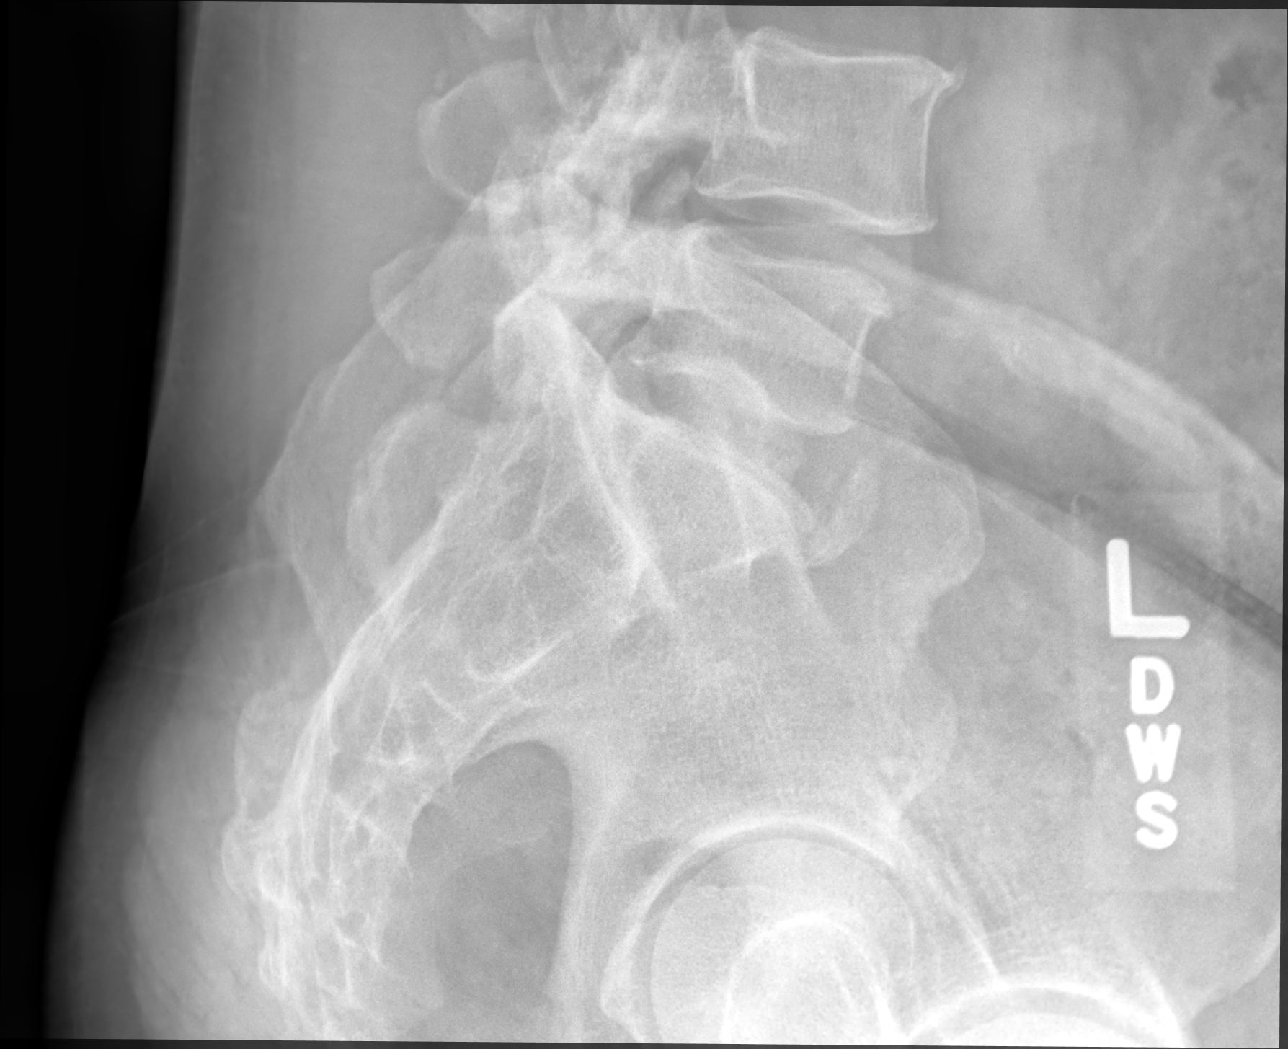

[5 of 5 positions shown; findings below may reference images not displayed]

FINDINGS: There is no fracture or bone destruction or disc space narrowing.
There is moderate facet arthritis at L4-5 bilaterally. Focal
degenerative changes of the superior aspects of both sacroiliac
joints.
IMPRESSION: 1. No acute abnormality. Degenerative facet arthritis at L4-5.
2. Bilateral sacroiliac joint arthritis.

## 2019-07-12 NOTE — Progress Notes (Signed)
Patient here for repeat A1C. 

## 2019-07-12 NOTE — Progress Notes (Signed)
Patient here for xray ordered during phone visit.

## 2019-07-13 LAB — HEMOGLOBIN A1C
Est. average glucose Bld gHb Est-mCnc: 200 mg/dL
Hgb A1c MFr Bld: 8.6 % — ABNORMAL HIGH (ref 4.8–5.6)

## 2019-07-15 ENCOUNTER — Other Ambulatory Visit: Payer: Self-pay | Admitting: Internal Medicine

## 2019-07-15 DIAGNOSIS — M545 Low back pain, unspecified: Secondary | ICD-10-CM

## 2019-07-15 DIAGNOSIS — E119 Type 2 diabetes mellitus without complications: Secondary | ICD-10-CM

## 2019-07-15 MED ORDER — METFORMIN HCL 1000 MG PO TABS: 1000.0000 mg | ORAL_TABLET | Freq: Two times a day (BID) | ORAL | 3 refills | Status: DC

## 2019-07-15 NOTE — Progress Notes (Signed)
Patient notified of results & recommendations. Expressed understanding.

## 2019-07-16 ENCOUNTER — Telehealth: Payer: Self-pay | Admitting: Internal Medicine

## 2019-07-16 NOTE — Telephone Encounter (Signed)
Patient was seen and had x-rays done, patient states that over the weekend is has gotten worse and is now constant.  Please contact patient in regards to treatment

## 2019-07-16 NOTE — Telephone Encounter (Signed)
Left voicemail sent MyChart message

## 2019-07-16 NOTE — Telephone Encounter (Signed)
While awaiting PT referral, patient can try NSAID and/or tylenol for pain management. Would also recommend trying heating pad to the area or an epsom salt bath.   Marcy Siren, D.O. Primary Care at [facility]  07/16/2019, 10:14 AM

## 2019-07-18 ENCOUNTER — Ambulatory Visit
Admission: EM | Admit: 2019-07-18 | Discharge: 2019-07-18 | Disposition: A | Discharge status: Home / Self Care | Payer: Medicaid Other | Attending: Emergency Medicine | Admitting: Emergency Medicine

## 2019-07-18 DIAGNOSIS — M5441 Lumbago with sciatica, right side: Secondary | ICD-10-CM

## 2019-07-18 DIAGNOSIS — G8929 Other chronic pain: Secondary | ICD-10-CM | POA: Diagnosis not present

## 2019-07-18 MED ORDER — METHYLPREDNISOLONE SODIUM SUCC 125 MG IJ SOLR
125.0000 mg | Freq: Once | INTRAMUSCULAR | Status: AC
  Administered 2019-07-18: 13:00:00 125 mg via INTRAMUSCULAR

## 2019-07-18 NOTE — Discharge Instructions (Addendum)
Keep doing stretches, heat, tylenol. Keep PT appointment on 4/21. May call ortho in the meantime to schedule appointment if needed. Go to ER for worsening pain, numbness in groin, difficulty urinating, or you lose control of holding your bowel.

## 2019-07-18 NOTE — ED Provider Notes (Signed)
[name]    CSN: [id number] Arrival date & time: 07/18/19  1210      History   Chief Complaint Chief Complaint  Patient presents with  . Back Pain    HPI Curtis Williamson is a 59 y.o. male with history of hypertension, diabetes, chronic low back pain presenting for persistent low back pain.  Patient states it has been going on for months, predominantly right-sided with occasional radiation down right leg to knee level.  Patient states that over the weekend has become left-sided.  Denying left leg radiation, saddle area anesthesia, urinary retention or fecal incontinence.  Patient denies fall or trauma to the area since being previously evaluated by PCP on 07/11/2019.  Patient states that he has tried all suggested remedies by PCP including hot baths, Epson salt, heating pad, ibuprofen 800 mg, BenGay.  States he only thing that has helped him is oxycodone, which was given to him by a family member.      Past Medical History:  Diagnosis Date  . Diabetes mellitus without complication ([place])   . Essential hypertension     Patient Active Problem List   Diagnosis Date Noted  . Tobacco dependence 03/26/2019  . New onset of headaches after age 53 03/26/2019  . Essential hypertension 03/26/2019  . Type 2 diabetes mellitus without complication, without long-term current use of insulin ([place]) 03/26/2019  . Aneurysm, cerebral, nonruptured 03/26/2019    Past Surgical History:  Procedure Laterality Date  . KIDNEY DONATION    . MANDIBLE FRACTURE SURGERY    . ROTATOR CUFF REPAIR Right        Home Medications    Prior to Admission medications   Medication Sig Start Date End Date Taking? Authorizing Provider  atorvastatin (LIPITOR) 10 MG tablet Take 1 tablet (10 mg total) by mouth daily. 03/30/19   Ladell Pier, MD  Blood Glucose Monitoring Suppl (ACCU-CHEK GUIDE ME) w/Device KIT 1 each by Does not apply route 2 (two) times daily. Use to check FSBS twice a day.  Dx: E11.9 03/28/19   Ladell Pier, MD  fluticasone (FLONASE) 50 MCG/ACT nasal spray Place 2 sprays into both nostrils daily. 07/11/19   Nicolette Bang, DO  glucose blood (ACCU-CHEK GUIDE) test strip Use to check FSBS twice a day. Dx: E11.9 03/28/19   Ladell Pier, MD  hydrOXYzine (ATARAX/VISTARIL) 25 MG tablet TAKE 1 TABLET (25 MG TOTAL) BY MOUTH EVERY 6 (SIX) HOURS. 03/29/19   Ladell Pier, MD  Lancets (ACCU-CHEK SOFT TOUCH) lancets Use to check FSBS twice a day. Dx: E11.9 05/14/19   Ladell Pier, MD  lisinopril-hydrochlorothiazide (ZESTORETIC) 10-12.5 MG tablet Take 1 tablet by mouth daily. 03/26/19   Ladell Pier, MD  metFORMIN (GLUCOPHAGE) 1000 MG tablet Take 1 tablet (1,000 mg total) by mouth 2 (two) times daily with a meal. 07/15/19   Nicolette Bang, DO  triamcinolone cream (KENALOG) 0.1 % Apply 1 application topically 2 (two) times daily. 07/11/19   Nicolette Bang, DO    Family History Family History  Problem Relation Age of Onset  . Hypertension Mother   . Kidney failure Mother   . Kidney disease Mother   . Heart disease Mother   . ADD / ADHD Daughter   . Anxiety disorder Daughter   . Alcohol abuse Father     Social History Social History   Tobacco Use  . Smoking status: Current Every Day Smoker  . Smokeless tobacco: Never Used  Substance  Use Topics  . Alcohol use: Yes  . Drug use: Never     Allergies   Mushroom extract complex   Review of Systems As per HPI   Physical Exam Triage Vital Signs ED Triage Vitals  Enc Vitals Group     BP 07/18/19 1216 128/83     Pulse Rate 07/18/19 1216 86     Resp 07/18/19 1216 20     Temp 07/18/19 1216 98.1 F (36.7 C)     Temp Source 07/18/19 1216 Oral     SpO2 07/18/19 1216 95 %     Weight --      Height --      Head Circumference --      Peak Flow --      Pain Score 07/18/19 1225 10     Pain Loc --      Pain Edu? --      Excl. in [place]? --    No data  found.  Updated Vital Signs BP 128/83 (BP Location: Left Arm)   Pulse 86   Temp 98.1 F (36.7 C) (Oral)   Resp 20   SpO2 95%   Visual Acuity Right Eye Distance:   Left Eye Distance:   Bilateral Distance:    Right Eye Near:   Left Eye Near:    Bilateral Near:     Physical Exam Constitutional:      General: He is not in acute distress. HENT:     Head: Normocephalic and atraumatic.  Eyes:     General: No scleral icterus.    Pupils: Pupils are equal, round, and reactive to light.  Cardiovascular:     Rate and Rhythm: Normal rate.  Pulmonary:     Effort: Pulmonary effort is normal. No respiratory distress.     Breath sounds: No wheezing.  Musculoskeletal:     Comments: Diffuse right-sided lumbar TTP without crepitus, edema.  No vertebral spinous process tenderness or PSIS tenderness bilaterally.  Decreased ROM second to pain.  Skin:    Coloration: Skin is not jaundiced or pale.     Findings: No bruising, erythema or rash.  Neurological:     General: No focal deficit present.     Mental Status: He is alert and oriented to person, place, and time.      UC Treatments / Results  Labs (all labs ordered are listed, but only abnormal results are displayed) Labs Reviewed - No data to display  EKG   Radiology No results found.  Procedures Procedures (including critical care time)  Medications Ordered in UC Medications  methylPREDNISolone sodium succinate (SOLU-MEDROL) 125 mg/2 mL injection 125 mg (125 mg Intramuscular Given 07/18/19 1326)    Initial Impression / Assessment and Plan / UC Course  I have reviewed the triage vital signs and the nursing notes.  Pertinent labs & imaging results that were available during my care of the patient were reviewed by me and considered in my medical decision making (see chart for details).     Patient afebrile, nontoxic in office today.  Patient previously evaluated less than 1 week ago by PCP with radiography not showing  acute findings.  Patient has PT appointment pending 08/07/2019: Intends to keep this.  Patient looking for pain leaf in the interim.  Provided orthopedic contact information, administered Solu-Medrol IM which patient tolerated well.  Provided stretches for low back pain with sciatica rehab, encouraged continuation of supportive care as outlined by PCP (see note).  Return precautions discussed, patient verbalized  understanding and is agreeable to plan. Final Clinical Impressions(s) / UC Diagnoses   Final diagnoses:  Chronic right-sided low back pain with right-sided sciatica     Discharge Instructions     Keep doing stretches, heat, tylenol. Keep PT appointment on 4/21. May call ortho in the meantime to schedule appointment if needed. Go to ER for worsening pain, numbness in groin, difficulty urinating, or you lose control of holding your bowel.    ED Prescriptions    None     I have reviewed the PDMP during this encounter.   [place], [place], [place] 07/18/19 1411

## 2019-07-18 NOTE — ED Triage Notes (Signed)
Pt c/o upper back pain radiating down rt leg/hip for over 2 months. States now the pain is going to lt side. States had an xray done last Thursday and told to use heat and antiinflammatory with no relief. Denies injury.

## 2019-07-22 ENCOUNTER — Other Ambulatory Visit: Payer: Self-pay

## 2019-07-22 ENCOUNTER — Encounter: Payer: 59 | Attending: Internal Medicine | Admitting: Registered"

## 2019-07-22 DIAGNOSIS — E119 Type 2 diabetes mellitus without complications: Secondary | ICD-10-CM | POA: Diagnosis not present

## 2019-07-22 NOTE — Progress Notes (Signed)
Diabetes Self-Management Education  Visit Type: Follow-up  Appt. Start Time: 1405 Appt. End Time: 8416  07/22/2019  [name], identified by name and date of birth, is a 59 y.o. male with a diagnosis of Diabetes: Type 2.   ASSESSMENT  There were no vitals taken for this visit. There is no height or weight on file to calculate BMI.  Patient A1c increased from 7.3% to 8.6% 10 days ago. Patient states he has been in the process of moving and couldn't find his medication or glucometer for awhile and was not taking it. Patient states for the last week has been back on track.   Pt reported CBGs 2-3 hours after eating approx 145 mg/dL. Pt states 125 mg/dL after 6 hours of not eating. Due to patients constantly rotating shifts he does not have a consistent time to get a FBS reading.   Pt states he has stopped eating bananas, but still has to get foods easy to chew such as hamburgers due to no teeth and waiting for dentures to be made and expects may have them within about a month.   Patient states he drinks 6 bottles of water/day and adds no-sugar flavor packets. Pt states he still drinks occasional soda, states he keeps them because his daughter still likes to drink them but she is drinking less now too.  Pt states he thinks he has sciatica and having a lot of pain in hip and leg a hard time getting comfortable.   Diabetes Self-Management Education - 07/22/19 1400      Visit Information   Visit Type  Follow-up      Initial Visit   Diabetes Type  Type 2      Dietary Intake   Breakfast  2 packs flavored oatmeal    Lunch  big mac & quarter pounder w cheese    Dinner  high protein Ensure    Beverage(s)  water, soda 4x week, capris sun      Exercise   Exercise Type  ADL's      Patient Education   Nutrition management   Food label reading, portion sizes and measuring food.;Other (comment)   what to look for in nutritional supplement drinks     Individualized Goals  (developed by patient)   Nutrition  General guidelines for healthy choices and portions discussed    Medications  take my medication as prescribed      Patient Self-Evaluation of Goals - Patient rates self as meeting previously set goals (% of time)   Nutrition  50 - 75 %      Outcomes   Expected Outcomes  Demonstrated interest in learning. Expect positive outcomes    Future DMSE  6 months    Program Status  Completed       Individualized Plan for Diabetes Self-Management Training:   Learning Objective:  Patient will have a greater understanding of diabetes self-management. Patient education plan is to attend individual and/or group sessions per assessed needs and concerns.  Patient Instructions  Looks like your blood sugar is doing better Continue keeping up with your medication Look for the lower sugar high protein variety Ensure Continue drinking water and having available for your daughter as well Consider drinking less soda and juice Continue getting more movement at work  Expected Outcomes:  Demonstrated interest in learning. Expect positive outcomes  Education material provided: none  If problems or questions, patient to contact team via:  Phone and MyChart  Future DSME appointment: 6  months

## 2019-07-22 NOTE — Progress Notes (Signed)
Patient notified of results & recommendations. Expressed understanding.

## 2019-07-22 NOTE — Patient Instructions (Addendum)
Looks like your blood sugar is doing better Continue keeping up with your medication Look for the lower sugar high protein variety Ensure Continue drinking water and having available for your daughter as well Consider drinking less soda and juice Continue getting more movement at work

## 2019-07-24 ENCOUNTER — Other Ambulatory Visit: Payer: Self-pay | Admitting: Internal Medicine

## 2019-07-24 DIAGNOSIS — M545 Low back pain, unspecified: Secondary | ICD-10-CM

## 2019-08-06 ENCOUNTER — Encounter: Payer: Self-pay | Admitting: Family Medicine

## 2019-08-06 ENCOUNTER — Other Ambulatory Visit: Payer: Self-pay

## 2019-08-06 ENCOUNTER — Ambulatory Visit (INDEPENDENT_AMBULATORY_CARE_PROVIDER_SITE_OTHER): Payer: 59 | Admitting: Family Medicine

## 2019-08-06 DIAGNOSIS — G8929 Other chronic pain: Secondary | ICD-10-CM | POA: Diagnosis not present

## 2019-08-06 DIAGNOSIS — M5440 Lumbago with sciatica, unspecified side: Secondary | ICD-10-CM

## 2019-08-06 MED ORDER — GABAPENTIN 300 MG PO CAPS: ORAL_CAPSULE | ORAL | 3 refills | Status: DC

## 2019-08-06 MED ORDER — BACLOFEN 10 MG PO TABS: 5.0000 mg | ORAL_TABLET | Freq: Three times a day (TID) | ORAL | 3 refills | Status: DC | PRN

## 2019-08-06 NOTE — Progress Notes (Signed)
Office Visit Note   Patient: Curtis Williamson           Date of Birth: 14-Oct-1960           MRN: [id number] Visit Date: 08/06/2019 Requested by: [name], DO [address] [place],  [place] [address] PCP: [name], DO  Subjective: Chief Complaint  Patient presents with  . Lower Back - Pain    Pain x 4 months and worsening. NKI. Started on right side, but moved to both sides. Pain radiates down posterior legs to thighs (mainly on right). No numbness/tingling.    HPI: He is here with low back pain.  Symptoms started 4 months ago, no injury.  Gradual onset of pain first in the right lower back radiating up toward the shoulder blade, then moved to the left.  Now has pain travels down the back of his legs to the hamstring area.  He cannot find a comfortable position.  He is in severe pain constantly.  Denies any bowel or bladder dysfunction, fevers or chills, night sweats, unintentional weight change.  He has tried a variety of over-the-counter medicines, topical remedies and has had no improvement in symptoms.  He had x-rays done last month showing some degenerative changes but no acute abnormality.  He is scheduled to start physical therapy tomorrow.  He is never had problems with his back before.  He has not been able to work for 3 weeks because of his pain.  He works as a Hospital doctor.  No family history of back problems or rheumatologic disease.              ROS: He has diabetes which is poorly controlled.  He has only one kidney.  All other systems were reviewed and are negative.  Objective: Vital Signs: There were no vitals taken for this visit.  Physical Exam:  General:  Alert and oriented, in no acute distress. Pulm:  Breathing unlabored. Psy:  Normal mood, congruent affect. Skin: No visible rash. Low back: He points to the region of his SI joints as location of his pain but it is not tender to palpation there.  No pain in the sciatic notch.  No pain  with straight leg raise or with internal/external rotation.  Lower extremity strength and reflexes are still normal.  Imaging: No results found.  Assessment & Plan: 1.  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  We will try gabapentin and baclofen as needed. -If not improving after 2 to 4 weeks, he will contact me and I will order MRI scan.  If MRI is unrevealing, then we will draw some additional labs.     Procedures: No procedures performed  No notes on file     PMFS History: Patient Active Problem List   Diagnosis Date Noted  . Tobacco dependence 03/26/2019  . New onset of headaches after age 74 03/26/2019  . Essential hypertension 03/26/2019  . Type 2 diabetes mellitus without complication, without long-term current use of insulin (HCC) 03/26/2019  . Aneurysm, cerebral, nonruptured 03/26/2019   Past Medical History:  Diagnosis Date  . Diabetes mellitus without complication (HCC)   . Essential hypertension     Family History  Problem Relation Age of Onset  . Hypertension Mother   . Kidney failure Mother   . Kidney disease Mother   . Heart disease Mother   . ADD / ADHD  Daughter   . Anxiety disorder Daughter   . Alcohol abuse Father     Past Surgical History:  Procedure Laterality Date  . KIDNEY DONATION    . MANDIBLE FRACTURE SURGERY    . ROTATOR CUFF REPAIR Right    Social History   Occupational History  . Occupation: Truck Geophysicist/field seismologist  Tobacco Use  . Smoking status: Current Every Day Smoker  . Smokeless tobacco: Never Used  Substance and Sexual Activity  . Alcohol use: Yes  . Drug use: Never  . Sexual activity: Not on file

## 2019-08-07 ENCOUNTER — Ambulatory Visit: Payer: 59 | Attending: Internal Medicine | Admitting: Physical Therapy

## 2019-08-07 ENCOUNTER — Encounter: Payer: Self-pay | Admitting: Physical Therapy

## 2019-08-07 DIAGNOSIS — G8929 Other chronic pain: Secondary | ICD-10-CM | POA: Insufficient documentation

## 2019-08-07 DIAGNOSIS — M25551 Pain in right hip: Secondary | ICD-10-CM | POA: Diagnosis not present

## 2019-08-07 DIAGNOSIS — M545 Low back pain, unspecified: Secondary | ICD-10-CM

## 2019-08-07 DIAGNOSIS — R2689 Other abnormalities of gait and mobility: Secondary | ICD-10-CM | POA: Diagnosis not present

## 2019-08-08 ENCOUNTER — Encounter: Payer: Self-pay | Admitting: Physical Therapy

## 2019-08-08 NOTE — Therapy (Signed)
[facility] [address] [place], [place], [address] Phone: [phone]   Fax:  [phone]  Physical Therapy Treatment  Patient Details  Name: Curtis Williamson MRN: [id number] Date of Birth: 09/17/1960 Referring Provider (PT): Rodney Langton DO    Encounter Date: 08/07/2019  PT End of Session - 08/07/19 0807    Visit Number  1    Number of Visits  4    Date for PT Re-Evaluation  09/04/19    PT Start Time  0800    PT Stop Time  0844    PT Time Calculation (min)  44 min    Activity Tolerance  Patient tolerated treatment well    Behavior During Therapy  [facility] for tasks assessed/performed       Past Medical History:  Diagnosis Date  . Diabetes mellitus without complication (HCC)   . Essential hypertension     Past Surgical History:  Procedure Laterality Date  . KIDNEY DONATION    . MANDIBLE FRACTURE SURGERY    . ROTATOR CUFF REPAIR Right     There were no vitals filed for this visit.  Subjective Assessment - 08/07/19 0759    Subjective  Patient had an incidious onset of lower back pain 4 months prior. He has no history of lower back pain. When the pain comes on it is intesne. He was unable to sleep last night. He has increased pain when he is standing and walking    Pertinent History  DMII, remote kidney donatation    Limitations  Standing;Walking    How long can you sit comfortably?  At times he can sit; other times he has to shift around    How long can you stand comfortably?  sometimes it is OK but other times the pain can be significant    How long can you walk comfortably?  Painful with community ambulation    Diagnostic tests  X-ray: moderate L4-L5 degeneration; Moderate Sacral degeneration bilateral    Patient Stated Goals  No pain    Currently in Pain?  Yes    Pain Score  4    has reached a 10/10   Pain Location  Back    Pain Orientation  Right    Pain Descriptors / Indicators  Aching;Stabbing;Sharp     Pain Type  Chronic pain    Pain Radiating Towards  radiates into the right leg mostly but can go to the left too    Pain Onset  More than a month ago    Pain Frequency  Constant    Aggravating Factors   Standing walking, prolonged positioning    Pain Relieving Factors  nothing    Effect of Pain on Daily Activities  difficulty standing , walking, and perfroming daily tasks.         [facility] PT Assessment - 08/08/19 0001      Assessment   Medical Diagnosis  Low Back Pain     Referring Provider (PT)  Cathrine Earlene Plater DO     Onset Date/Surgical Date  --   4 months prior    Hand Dominance  Right    Next MD Visit  Nothing scheduled at this time     Prior Therapy  On RTC       Precautions   Precautions  None      Restrictions   Weight Bearing Restrictions  No      Balance Screen   Has the patient fallen in the past 6 months  No    Has the patient had a decrease in activity level because of a fear of falling?   No    Is the patient reluctant to leave their home because of a fear of falling?   No      Prior Function   Level of Independence  Independent    Vocation  Full time employment    [employer] trucks. Has been off for the last few weeks.       Cognition   Overall Cognitive Status  Within Functional Limits for tasks assessed      Observation/Other Assessments   Observations  Sits in a posterior pelvicx tilt slouched in the chair     Focus on Therapeutic Outcomes (FOTO)   Mediciad       Sensation   Light Touch  Appears Intact    Additional Comments  pain down bilateral posterior legs to back of the knee area       Coordination   Gross Motor Movements are Fluid and Coordinated  Yes    Fine Motor Movements are Fluid and Coordinated  Yes      Posture/Postural Control   Posture Comments  rounded shoulders; forward head; sits slouched backwardwith a posterior pelvic tilt       AROM   Lumbar Flexion  30     Lumbar Extension  13    Lumbar - Right Side  Bend  Painful     Lumbar - Left Side Bend  not painful     Lumbar - Right Rotation  pain with end range worse then the left     Lumbar - Left Rotation  Painful but better then the right       PROM   PROM Assessment Site  Hip    Right/Left Hip  Right    Right Hip Flexion  60   degrees with significant pain    Right Hip Internal Rotation   --   signficant pain and motion restrictions      Strength   Right Hip Flexion  3+/5    Right Hip ABduction  4/5    Right Hip ADduction  4+/5    Left Hip Flexion  5/5    Left Hip ABduction  5/5    Left Hip ADduction  5/5      Palpation   Palpation comment  tender to palpation in the left gluteal and left lower lumbar spine       Transfers   Comments  decreased right signle leg stsance time; decreased right hip lfexion with gait; increased lateral mobility                    [facility] Adult PT Treatment/Exercise - 08/08/19 0001      Exercises   Exercises  Lumbar      Lumbar Exercises: Stretches   Active Hamstring Stretch Limitations  seated hamstring stretch 3x20 sec hold     Piriformis Stretch Limitations  2x20 sec hold bilateral       Manual Therapy   Manual Therapy  Joint mobilization    Manual therapy comments  attemped light LAD patient reported a slight increase in pain but he had inmproved hip movement;     Joint Mobilization  In available flexion posterior hip mobilization 2x10              PT Education - 08/08/19 1020    Education Details  reviewed HEp and symptom mangement  Person(s) Educated  Patient    Methods  Explanation;Demonstration;Tactile cues;Verbal cues    Comprehension  Returned demonstration;Verbal cues required;Verbalized understanding;Tactile cues required;Need further instruction       PT Short Term Goals - 08/08/19 1339      PT SHORT TERM GOAL #1   Title  Patient will increase passive right hip flexion to 90 degrees without pain    Baseline  60 with pain    Time  3    Period  Weeks     Status  New    Target Date  08/29/19      PT SHORT TERM GOAL #2   Title  Patient will increase right hip gross strength to 4+/5    Baseline  right hip flexion 3+/5 right hip abduction 4/5    Time  3    Period  Weeks    Status  New    Target Date  08/29/19      PT SHORT TERM GOAL #3   Title  Patient will be indepedendent with base HEP to improve strength and reduce inflammation    Baseline  no HEP    Time  3    Period  Weeks    Status  New    Target Date  08/29/19        PT Long Term Goals - 08/08/19 1342      PT LONG TERM GOAL #1   Title  Patient will increase right hip active flexion to 90 degrees without pain in order to walk without increased pain    Baseline  60 degrees with pain    Time  6    Period  Weeks    Status  New    Target Date  09/19/19      PT LONG TERM GOAL #2   Title  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 Has to sit leaned back    Time  6    Period  Weeks    Status  New    Target Date  09/19/19            Plan - 08/07/19 1201    Clinical Impression Statement  Patient is a 59 year old male with bilateral lower back pain R > L. He is increased pain with any prolinged positioning. He has a signifcant limitation in right hip flexion and right hip strength. 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 He has spasmoing in his QL, lumbar paraspinals and bilateral gluteals R > L. He has decreased right single leg stance time withgait. He would benefit from skilled therapy to improve hip mobility, strength, and ability to sit, walk, and stand.    Personal Factors and Comorbidities  Comorbidity 1    Comorbidities  DMII    Examination-Activity Limitations  Stand;Squat;Sit    Examination-Participation Restrictions  Driving;Shop;Community Activity    Stability/Clinical Decision Making  Evolving/Moderate complexity    Clinical Decision Making   Moderate    Rehab Potential  Good    PT Frequency  1x / week    PT Treatment/Interventions  ADLs/Self Care Home Management;Cryotherapy;Electrical Stimulation;Iontophoresis 4mg /ml Dexamethasone;Moist Heat;Ultrasound;Traction;DME Instruction;Stair training;Gait training;Therapeutic activities;Therapeutic exercise;Functional mobility training;Balance training;Neuromuscular re-education;Patient/family education;Manual techniques;Taping    PT Next Visit Plan  patient had an increase with pain with LAD but he was very tense. Consder manual therapy to right  hip and right lumbar spine; review HEP; begin light core strengthning consider seated clam shell consider law with posture EOB; attmept posterior hip mobs if abole; consdier standing exercises when patient is able. Modalities PRN    PT Home Exercise Plan  pirifromis stretch; hamstring stretch; tennis ball trigger point release.    Consulted and Agree with Plan of Care  Patient       Patient will benefit from skilled therapeutic intervention in order to improve the following deficits and impairments:  Abnormal gait, Increased muscle spasms, Decreased activity tolerance, Decreased strength, Pain, Decreased range of motion, Decreased endurance, Difficulty walking  Visit Diagnosis: Chronic bilateral low back pain without sciatica  Pain in right hip  Other abnormalities of gait and mobility     Problem List Patient Active Problem List   Diagnosis Date Noted  . Tobacco dependence 03/26/2019  . New onset of headaches after age 55 03/26/2019  . Essential hypertension 03/26/2019  . Type 2 diabetes mellitus without complication, without long-term current use of insulin ([place]) 03/26/2019  . Aneurysm, cerebral, nonruptured 03/26/2019    Carney Living PTDPT  08/08/2019, 1:46 PM  [facility] [address] [place], [place], [address] Phone: [phone]   Fax:  [phone]  Name: Curtis Williamson MRN: [id number] Date of Birth: Jul 26, 1960

## 2019-08-14 ENCOUNTER — Encounter: Payer: Self-pay | Admitting: Emergency Medicine

## 2019-08-14 ENCOUNTER — Other Ambulatory Visit: Payer: Self-pay

## 2019-08-14 ENCOUNTER — Ambulatory Visit
Admission: EM | Admit: 2019-08-14 | Discharge: 2019-08-14 | Disposition: A | Discharge status: Home / Self Care | Payer: 59

## 2019-08-14 DIAGNOSIS — M25551 Pain in right hip: Secondary | ICD-10-CM
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
## 2019-08-14 NOTE — ED Notes (Signed)
Asked patient about his breathing, slightly rapid.  He responded he struggled getting in and out of car hurting.
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
## 2019-08-14 NOTE — Discharge Instructions (Addendum)
May take all home pain medications as directed. May apply ice to swollen areas, heat to tight areas. Keep PT appointment tomorrow if they can further work the area. Return for worsening pain, swelling, difficulty walking, numbness or tingling.

## 2019-08-15 ENCOUNTER — Encounter: Payer: Self-pay | Admitting: Physical Therapy

## 2019-08-15 ENCOUNTER — Ambulatory Visit: Payer: 59 | Admitting: Physical Therapy

## 2019-08-15 DIAGNOSIS — R2689 Other abnormalities of gait and mobility: Secondary | ICD-10-CM | POA: Diagnosis not present

## 2019-08-15 DIAGNOSIS — M545 Low back pain, unspecified: Secondary | ICD-10-CM

## 2019-08-15 DIAGNOSIS — G8929 Other chronic pain: Secondary | ICD-10-CM

## 2019-08-15 DIAGNOSIS — M25551 Pain in right hip: Secondary | ICD-10-CM
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
## 2019-08-20 ENCOUNTER — Other Ambulatory Visit: Payer: Self-pay

## 2019-08-20 DIAGNOSIS — I10 Essential (primary) hypertension: Secondary | ICD-10-CM

## 2019-08-20 DIAGNOSIS — E1165 Type 2 diabetes mellitus with hyperglycemia: Secondary | ICD-10-CM

## 2019-08-20 MED ORDER — ACCU-CHEK SOFT TOUCH LANCETS MISC: 1 refills | Status: DC

## 2019-08-20 MED ORDER — HYDROXYZINE HCL 25 MG PO TABS: 25.0000 mg | ORAL_TABLET | Freq: Four times a day (QID) | ORAL | 2 refills | Status: DC

## 2019-08-20 MED ORDER — ACCU-CHEK GUIDE VI STRP: ORAL_STRIP | 1 refills | Status: DC

## 2019-08-20 MED ORDER — ATORVASTATIN CALCIUM 10 MG PO TABS: 10.0000 mg | ORAL_TABLET | Freq: Every day | ORAL | 1 refills | Status: DC

## 2019-08-20 MED ORDER — LISINOPRIL-HYDROCHLOROTHIAZIDE 10-12.5 MG PO TABS: 1.0000 | ORAL_TABLET | Freq: Every day | ORAL | 1 refills | Status: DC

## 2019-08-23 ENCOUNTER — Other Ambulatory Visit: Payer: Self-pay

## 2019-08-23 ENCOUNTER — Ambulatory Visit: Payer: 59 | Attending: Internal Medicine | Admitting: Physical Therapy

## 2019-08-23 ENCOUNTER — Encounter: Payer: Self-pay | Admitting: Physical Therapy

## 2019-08-23 DIAGNOSIS — R2689 Other abnormalities of gait and mobility: Secondary | ICD-10-CM | POA: Diagnosis not present

## 2019-08-23 DIAGNOSIS — M545 Low back pain: Secondary | ICD-10-CM | POA: Insufficient documentation

## 2019-08-23 DIAGNOSIS — G8929 Other chronic pain: Secondary | ICD-10-CM | POA: Diagnosis not present

## 2019-08-23 DIAGNOSIS — M25551 Pain in right hip: Secondary | ICD-10-CM | POA: Diagnosis not present
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
## 2019-08-28 ENCOUNTER — Ambulatory Visit: Payer: 59 | Admitting: Physical Therapy

## 2019-08-28 ENCOUNTER — Other Ambulatory Visit: Payer: Self-pay

## 2019-08-28 ENCOUNTER — Encounter: Payer: Self-pay | Admitting: Physical Therapy

## 2019-08-28 DIAGNOSIS — R2689 Other abnormalities of gait and mobility: Secondary | ICD-10-CM

## 2019-08-28 DIAGNOSIS — M545 Low back pain: Secondary | ICD-10-CM | POA: Diagnosis not present

## 2019-08-28 DIAGNOSIS — M25551 Pain in right hip: Secondary | ICD-10-CM

## 2019-08-28 DIAGNOSIS — G8929 Other chronic pain: Secondary | ICD-10-CM
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
## 2019-09-12 ENCOUNTER — Ambulatory Visit: Payer: 59 | Admitting: Physical Therapy

## 2019-09-12 ENCOUNTER — Other Ambulatory Visit: Payer: Self-pay

## 2019-09-12 DIAGNOSIS — R2689 Other abnormalities of gait and mobility: Secondary | ICD-10-CM

## 2019-09-12 DIAGNOSIS — M25551 Pain in right hip: Secondary | ICD-10-CM

## 2019-09-12 DIAGNOSIS — M545 Low back pain: Secondary | ICD-10-CM | POA: Diagnosis not present

## 2019-09-12 DIAGNOSIS — G8929 Other chronic pain: Secondary | ICD-10-CM

## 2019-09-13 ENCOUNTER — Encounter: Payer: Self-pay | Admitting: Physical Therapy

## 2019-09-13 NOTE — Therapy (Signed)
[place], [place], [address] Phone: [phone]   Fax:  [phone]  Physical Therapy Treatment  Patient Details  Name: Curtis Williamson MRN: [id number] Date of Birth: Jan 10, 1961 Referring Provider (PT): Cornelious Bryant DO    Encounter Date: 09/12/2019  PT End of Session - 09/12/19 1152    Visit Number  5    Number of Visits  11    Date for PT Re-Evaluation  10/09/19    Authorization Type  Medicaid approved for initial 3 visits.    Authorization Time Period  09/09/2019-09/29/2019    Authorization - Visit Number  1    Authorization - Number of Visits  3    PT Start Time  1146    PT Stop Time  1228    PT Time Calculation (min)  42 min    Activity Tolerance  Patient tolerated treatment well    Behavior During Therapy  [facility] for tasks assessed/performed       Past Medical History:  Diagnosis Date  . Diabetes mellitus without complication ([place])   . Essential hypertension     Past Surgical History:  Procedure Laterality Date  . KIDNEY DONATION    . MANDIBLE FRACTURE SURGERY    . ROTATOR CUFF REPAIR Right     There were no vitals filed for this visit.                     [place] Adult PT Treatment/Exercise - 09/13/19 0001      Lumbar Exercises: Stretches   Passive Hamstring Stretch  2 reps;20 seconds    Lower Trunk Rotation Limitations  x20     Piriformis Stretch Limitations  2x20 sec hold bilateral       Lumbar Exercises: Aerobic   Nustep  L3 UE.LE x 5 minutes       Lumbar Exercises: Seated   Other Seated Lumbar Exercises  seated clam shell green  x20       Lumbar Exercises: Supine   AB Set Limitations  reviewed abdominal breathing x10 with PPT     Bent Knee Raise Limitations  2x10 with cuing for range     Bridge  15 reps      Manual Therapy   Manual Therapy  Manual Traction;Soft tissue mobilization    Joint Mobilization  side lying PA mobilization of lower lumbar  spine ; ttmepted posterior hip mobilization but patient could not tolerate.     Soft tissue mobilization  STM to upper gluteal and quadratus     Manual Traction  grade II and III occialtions;              PT Education - 09/13/19 0911    Education Details  updated HEP and POC    Person(s) Educated  Patient    Methods  Explanation;Demonstration;Verbal cues;Tactile cues    Comprehension  Verbalized understanding;Returned demonstration;Verbal cues required;Tactile cues required       PT Short Term Goals - 08/28/19 1258      PT SHORT TERM GOAL #1   Title  Patient will increase passive right hip flexion to 90 degrees without pain    Baseline  95 but continues to have pain    Time  3    Period  Weeks    Status  Partially Met    Target Date  08/29/19      PT SHORT TERM GOAL #2   Title  Patient will increase right hip gross strength  to 4+/5    Baseline  4+/5 gross    Time  3    Period  Weeks    Status  Achieved    Target Date  09/18/19      PT SHORT TERM GOAL #3   Title  Patient will be indepedendent with base HEP to improve strength and reduce inflammation    Baseline  indepdnent with basic HEP    Time  3    Period  Weeks    Status  Achieved    Target Date  09/18/19        PT Long Term Goals - 08/28/19 1300      PT LONG TERM GOAL #1   Title  Patient will increase right hip active flexion to 110 degrees without pain in order to walk without increased pain    Baseline  can flexion to 95 but has pain and pain with walking distances    Time  6    Period  Weeks    Status  Revised    Target Date  10/09/19      PT LONG TERM GOAL #2   Title  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 His  hipflexion is neraly full at this point. He continues to have some pain with standing activity but it is improving. Thge spot in his hip appears to be reducing in size, Therapy continues to work on progressing his strength.    Comorbidities  DMII    Examination-Activity Limitations  Stand;Squat;Sit    Examination-Participation Restrictions  Driving;Shop;Community Activity    Stability/Clinical Decision Making  Evolving/Moderate complexity    Clinical Decision Making  Moderate    Rehab Potential  Good    PT Duration  3 weeks    PT Treatment/Interventions  ADLs/Self Care Home Management;Cryotherapy;Electrical Stimulation;Iontophoresis 48m/ml Dexamethasone;Moist Heat;Ultrasound;Traction;DME Instruction;Stair training;Gait training;Therapeutic activities;Therapeutic exercise;Functional mobility training;Balance training;Neuromuscular re-education;Patient/family education;Manual techniques;Taping    PT Next Visit Plan  continue to progress strengthening. Owrk on standing exercises as tolerated. Modalities PRN    PT Home Exercise Plan  pirifromis stretch; hamstring stretch; tennis ball trigger point release.    Consulted and Agree with Plan of Care  Patient       Patient will benefit from skilled therapeutic intervention in order to improve the following deficits and impairments:  Abnormal gait, Increased muscle spasms, Decreased activity tolerance, Decreased strength, Pain, Decreased range of motion, Decreased endurance, Difficulty walking  Visit Diagnosis: Chronic bilateral low back pain without sciatica  Pain in right hip  Other abnormalities of gait and mobility     Problem List Patient Active Problem List   Diagnosis Date Noted  . Tobacco dependence 03/26/2019  . New onset of headaches after age 384512/11/2018  . Essential hypertension 03/26/2019  . Type 2 diabetes mellitus without complication, without long-term current use of insulin ([place] 03/26/2019  . Aneurysm, cerebral,  nonruptured 03/26/2019    DCarney LivingPT DPT  09/13/2019, 9:24 AM  [facility] [address]Phone: [phone]  Fax:  [phone]  Name: Curtis Williamson MRN: [id number] Date of Birth: 1960-08-23

## 2019-09-18 ENCOUNTER — Encounter: Payer: Self-pay | Admitting: Physical Therapy

## 2019-09-18 ENCOUNTER — Ambulatory Visit: Payer: Medicaid Other | Attending: Internal Medicine | Admitting: Physical Therapy

## 2019-09-18 ENCOUNTER — Other Ambulatory Visit: Payer: Self-pay

## 2019-09-18 DIAGNOSIS — G8929 Other chronic pain: Secondary | ICD-10-CM | POA: Diagnosis present

## 2019-09-18 DIAGNOSIS — M545 Low back pain: Secondary | ICD-10-CM | POA: Diagnosis present

## 2019-09-18 DIAGNOSIS — M25551 Pain in right hip: Secondary | ICD-10-CM | POA: Diagnosis present

## 2019-09-18 DIAGNOSIS — R2689 Other abnormalities of gait and mobility: Secondary | ICD-10-CM | POA: Insufficient documentation

## 2019-09-18 NOTE — Therapy (Signed)
[place], [place], [address] Phone: [phone]   Fax:  [phone]  Physical Therapy Treatment  Patient Details  Name: Curtis Williamson MRN: [id number] Date of Birth: Sep 09, 1960 Referring Provider (PT): Cornelious Bryant DO    Encounter Date: 09/18/2019  PT End of Session - 09/18/19 0853    Visit Number  6    Number of Visits  11    Date for PT Re-Evaluation  10/09/19    Authorization Type  Medicaid approved for initial 3 visits.    Authorization Time Period  09/09/2019-09/29/2019    Authorization - Visit Number  2    Authorization - Number of Visits  3    PT Start Time  0845    PT Stop Time  0926    PT Time Calculation (min)  41 min    Activity Tolerance  Patient tolerated treatment well    Behavior During Therapy  [facility] for tasks assessed/performed       Past Medical History:  Diagnosis Date  . Diabetes mellitus without complication ([place])   . Essential hypertension     Past Surgical History:  Procedure Laterality Date  . KIDNEY DONATION    . MANDIBLE FRACTURE SURGERY    . ROTATOR CUFF REPAIR Right     There were no vitals filed for this visit.  Subjective Assessment - 09/18/19 0849    Subjective  Patient reports hiright mid back has been a little sore. He is having a little more pain when he is driving. he feels like the big spasm in his low back is better.    Pertinent History  DMII, remote kidney donatation    Limitations  Standing;Walking    How long can you sit comfortably?  At times he can sit; other times he has to shift around    How long can you stand comfortably?  sometimes it is OK but other times the pain can be significant    How long can you walk comfortably?  Painful with community ambulation    Diagnostic tests  X-ray: moderate L4-L5 degeneration; Moderate Sacral degeneration bilateral    Patient Stated Goals  No pain    Currently in Pain?  Yes    Pain Score  4     Pain  Location  Hip    Pain Orientation  Right    Pain Descriptors / Indicators  Aching    Pain Type  Chronic pain    Pain Radiating Towards  no pain into his leg today    Pain Onset  More than a month ago    Pain Frequency  Constant    Aggravating Factors   standing and sleeping    Pain Relieving Factors  heat and ice    Effect of Pain on Daily Activities  difficulty standing    Multiple Pain Sites  No                        [facility] Adult PT Treatment/Exercise - 09/18/19 0001      Lumbar Exercises: Stretches   Passive Hamstring Stretch  2 reps;20 seconds    Lower Trunk Rotation Limitations  x20     Other Lumbar Stretch Exercise  posterior capsule sttretch 3x20 sec hold; triceps stretch with side bend: had some anterior shoulder pain so halted; counter stretch 3x15 sec hold cuing fto hold longer and not to bouce.       Lumbar Exercises: Aerobic  Nustep  L3 UE.LE x 5 minutes       Lumbar Exercises: Standing   Other Standing Lumbar Exercises  shoulder extension with breathing 2x10 red; scap retraction x10. Patient reported fatigue and pain.       Lumbar Exercises: Supine   Bent Knee Raise Limitations  2x10 with cuing for range     Bridge  15 reps      Manual Therapy   Manual Therapy  Manual Traction;Soft tissue mobilization    Joint Mobilization  side lying PA mobilization of lower lumbar spine ; ttmepted posterior hip mobilization but patient could not tolerate.; also perfromed to the right peri-scapular area and the mid thoracic paraspinals.     Soft tissue mobilization  STM to upper gluteal and quadratus     Manual Traction  grade II and III occialtions;              PT Education - 09/18/19 0851    Education Details  reviewed HEP and symptom mangagement    Person(s) Educated  Patient    Methods  Explanation;Tactile cues;Verbal cues;Demonstration    Comprehension  Verbalized understanding;Returned demonstration;Verbal cues required;Tactile cues required        PT Short Term Goals - 08/28/19 1258      PT SHORT TERM GOAL #1   Title  Patient will increase passive right hip flexion to 90 degrees without pain    Baseline  95 but continues to have pain    Time  3    Period  Weeks    Status  Partially Met    Target Date  08/29/19      PT SHORT TERM GOAL #2   Title  Patient will increase right hip gross strength to 4+/5    Baseline  4+/5 gross    Time  3    Period  Weeks    Status  Achieved    Target Date  09/18/19      PT SHORT TERM GOAL #3   Title  Patient will be indepedendent with base HEP to improve strength and reduce inflammation    Baseline  indepdnent with basic HEP    Time  3    Period  Weeks    Status  Achieved    Target Date  09/18/19        PT Long Term Goals - 08/28/19 1300      PT LONG TERM GOAL #1   Title  Patient will increase right hip active flexion to 110 degrees without pain in order to walk without increased pain    Baseline  can flexion to 95 but has pain and pain with walking distances    Time  6    Period  Weeks    Status  Revised    Target Date  10/09/19      PT LONG TERM GOAL #2   Title  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 He was given stretches that were more for his mid back. He had some pain  with his basic exercises today. He was advised to continue to try to progress them but not to let them get too sore. Therapy focused on postural exercises as his pain is more in his mid back. He had some spasming in his mid thoracic/lower trap area.    Personal Factors and Comorbidities  Comorbidity 1    Comorbidities  DMII    Examination-Activity Limitations  Stand;Squat;Sit    Examination-Participation  Restrictions  Driving;Shop;Community Activity    Stability/Clinical Decision Making  Evolving/Moderate complexity    Clinical Decision Making  Moderate    Rehab Potential  Good    PT Frequency  1x / week    PT Duration  3 weeks    PT Treatment/Interventions  ADLs/Self Care Home Management;Cryotherapy;Electrical Stimulation;Iontophoresis 31m/ml Dexamethasone;Moist Heat;Ultrasound;Traction;DME Instruction;Stair training;Gait training;Therapeutic activities;Therapeutic exercise;Functional mobility training;Balance training;Neuromuscular re-education;Patient/family education;Manual techniques;Taping    PT Next Visit Plan  continue to progress strengthening. Owrk on standing exercises as tolerated. Modalities PRN    PT Home Exercise Plan  pirifromis stretch; hamstring stretch; tennis ball trigger point release.    Consulted and Agree with Plan of Care  Patient       Patient will benefit from skilled therapeutic intervention in order to improve the following deficits and impairments:  Abnormal gait, Increased muscle spasms, Decreased activity tolerance, Decreased strength, Pain, Decreased range of motion, Decreased endurance, Difficulty walking  Visit Diagnosis: Chronic bilateral low back pain without sciatica  Pain in right hip  Other abnormalities of gait and mobility     Problem List Patient Active Problem List   Diagnosis Date Noted  . Tobacco dependence 03/26/2019  . New onset of headaches after age 334112/11/2018  . Essential hypertension 03/26/2019  . Type 2 diabetes mellitus without complication, without long-term current use of insulin ([place] 03/26/2019  . Aneurysm, cerebral, nonruptured 03/26/2019    DCarney LivingPT DPT  09/18/2019, 10:50 AM  [facility] [address]Phone: [phone]  Fax:  [phone] Name: Curtis FlessnerMRN: [id number] of Birth: 109/08/1960

## 2019-09-25 ENCOUNTER — Ambulatory Visit: Payer: Medicaid Other | Admitting: Physical Therapy

## 2019-09-25 ENCOUNTER — Other Ambulatory Visit: Payer: Self-pay

## 2019-09-25 ENCOUNTER — Encounter: Payer: Self-pay | Admitting: Physical Therapy

## 2019-09-25 DIAGNOSIS — M545 Low back pain: Secondary | ICD-10-CM | POA: Diagnosis not present

## 2019-09-25 DIAGNOSIS — R2689 Other abnormalities of gait and mobility: Secondary | ICD-10-CM

## 2019-09-25 DIAGNOSIS — M25551 Pain in right hip: Secondary | ICD-10-CM

## 2019-09-25 DIAGNOSIS — G8929 Other chronic pain: Secondary | ICD-10-CM

## 2019-09-25 NOTE — Therapy (Signed)
[place], [place], [address] Phone: [phone]   Fax:  [phone]  Physical Therapy Treatment  Patient Details  Name: Curtis Williamson MRN: [id number] Date of Birth: May 19, 1960 Referring Provider (PT): Cornelious Bryant DO    Encounter Date: 09/25/2019  PT End of Session - 09/25/19 0851    Visit Number  7    Number of Visits  11    Date for PT Re-Evaluation  10/09/19    Authorization Type  Medicaid approved for initial 3 visits.    Authorization Time Period  09/09/2019-09/29/2019    Authorization - Visit Number  3    Authorization - Number of Visits  3    PT Start Time  0846    PT Stop Time  0925    PT Time Calculation (min)  39 min    Activity Tolerance  Patient tolerated treatment well    Behavior During Therapy  [facility] for tasks assessed/performed       Past Medical History:  Diagnosis Date   Diabetes mellitus without complication ([place])    Essential hypertension     Past Surgical History:  Procedure Laterality Date   KIDNEY DONATION     MANDIBLE FRACTURE SURGERY     ROTATOR CUFF REPAIR Right     There were no vitals filed for this visit.  Subjective Assessment - 09/25/19 0849    Subjective  Patient reports he is only having pain at this time when he stands for too long in one spot or he wals too much.    Pertinent History  DMII, remote kidney donatation    Limitations  Standing;Walking    How long can you sit comfortably?  At times he can sit; other times he has to shift around    How long can you stand comfortably?  sometimes it is OK but other times the pain can be significant    How long can you walk comfortably?  Painful with community ambulation    Diagnostic tests  X-ray: moderate L4-L5 degeneration; Moderate Sacral degeneration bilateral    Patient Stated Goals  No pain    Currently in Pain?  No/denies                        [facility] Adult PT  Treatment/Exercise - 09/25/19 0001      Lumbar Exercises: Stretches   Lower Trunk Rotation Limitations  x20     Piriformis Stretch Limitations  2x20 sec hold bilateral       Lumbar Exercises: Aerobic   Nustep  L3 UE.LE x 5 minutes       Lumbar Exercises: Standing   Other Standing Lumbar Exercises  shoulder extension with breathing 2x10 red; scap retraction x10. Patient reported fatigue and pain again. he was advised he needs to work on this at home. Chope red x10 each side        Lumbar Exercises: Supine   AB Set Limitations  reviewed abdominal breathing x10 with PPT     Bent Knee Raise Limitations  2x10 with cuing for range     Bridge  15 reps      Manual Therapy   Manual Therapy  Manual Traction;Soft tissue mobilization    Joint Mobilization  side lying PA mobilization of lower lumbar spine ; ttmepted posterior hip mobilization but patient could not tolerate.; also perfromed to the right peri-scapular area and the mid thoracic paraspinals.     Soft tissue mobilization  STM to upper gluteal and quadratus     Manual Traction  grade II and III occialtions;              PT Education - 09/25/19 0850    Education Details  HEP and symptom mannagement    Person(s) Educated  Patient    Methods  Explanation;Demonstration;Tactile cues;Verbal cues    Comprehension  Verbalized understanding;Returned demonstration;Verbal cues required;Tactile cues required       PT Short Term Goals - 08/28/19 1258      PT SHORT TERM GOAL #1   Title  Patient will increase passive right hip flexion to 90 degrees without pain    Baseline  95 but continues to have pain    Time  3    Period  Weeks    Status  Partially Met    Target Date  08/29/19      PT SHORT TERM GOAL #2   Title  Patient will increase right hip gross strength to 4+/5    Baseline  4+/5 gross    Time  3    Period  Weeks    Status  Achieved    Target Date  09/18/19      PT SHORT TERM GOAL #3   Title  Patient will be  indepedendent with base HEP to improve strength and reduce inflammation    Baseline  indepdnent with basic HEP    Time  3    Period  Weeks    Status  Achieved    Target Date  09/18/19        PT Long Term Goals - 08/28/19 1300      PT LONG TERM GOAL #1   Title  Patient will increase right hip active flexion to 110 degrees without pain in order to walk without increased pain    Baseline  can flexion to 95 but has pain and pain with walking distances    Time  6    Period  Weeks    Status  Revised    Target Date  10/09/19      PT LONG TERM GOAL #2   Title  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 per visual inspection he hads full right hip strength. he was able to advance to a green band with strengthening. he has minimal trigger points in the lumbar spine. We will re-assess next visit for a likley D/C to HEP at that time.    Personal Factors and Comorbidities  Comorbidity 1    Comorbidities  DMII    Examination-Activity Limitations  Stand;Squat;Sit    Examination-Participation Restrictions  Driving;Shop;Community Activity    Stability/Clinical Decision Making  Evolving/Moderate complexity    Clinical Decision Making  Moderate    PT Frequency  1x / week    PT Duration  3 weeks    PT Treatment/Interventions  ADLs/Self Care Home Management;Cryotherapy;Electrical Stimulation;Iontophoresis 44m/ml Dexamethasone;Moist Heat;Ultrasound;Traction;DME Instruction;Stair training;Gait training;Therapeutic activities;Therapeutic exercise;Functional mobility training;Balance training;Neuromuscular re-education;Patient/family education;Manual techniques;Taping    PT Next Visit Plan  continue to progress  strengthening. OGrayland Ormond  on standing exercises as tolerated. Modalities PRN    PT Home Exercise Plan  pirifromis stretch; hamstring stretch; tennis ball trigger point release.    Consulted and Agree with Plan of Care  Patient       Patient will benefit from skilled therapeutic intervention in order to improve the following deficits and impairments:  Abnormal gait, Increased muscle spasms, Decreased activity tolerance, Decreased strength, Pain, Decreased range of motion, Decreased endurance, Difficulty walking  Visit Diagnosis: Chronic bilateral low back pain without sciatica  Pain in right hip  Other abnormalities of gait and mobility     Problem List Patient Active Problem List   Diagnosis Date Noted   Tobacco dependence 03/26/2019   New onset of headaches after age 45 03/26/2019   Essential hypertension 03/26/2019   Type 2 diabetes mellitus without complication, without long-term current use of insulin ([place]) 03/26/2019   Aneurysm, cerebral, nonruptured 03/26/2019    Carney Living PT DPT  09/25/2019, 9:42 AM  [facility] [address] [place], [place], [address] Phone: [phone]   Fax:  [phone]  Name: Curtis Williamson MRN: [id number] Date of Birth: 1961/04/14

## 2019-10-02 ENCOUNTER — Encounter: Payer: Self-pay | Admitting: Physical Therapy

## 2019-10-02 ENCOUNTER — Other Ambulatory Visit: Payer: Self-pay

## 2019-10-02 ENCOUNTER — Ambulatory Visit: Payer: Medicaid Other | Admitting: Physical Therapy

## 2019-10-02 DIAGNOSIS — R2689 Other abnormalities of gait and mobility: Secondary | ICD-10-CM

## 2019-10-02 DIAGNOSIS — M545 Low back pain, unspecified: Secondary | ICD-10-CM

## 2019-10-02 DIAGNOSIS — M25551 Pain in right hip: Secondary | ICD-10-CM

## 2019-10-03 ENCOUNTER — Encounter: Payer: Self-pay | Admitting: Physical Therapy

## 2019-10-03 NOTE — Therapy (Signed)
[place], [place], [address] Phone: [phone]   Fax:  [phone]  Physical Therapy Treatment/Discharge   Patient Details  Name: Curtis Williamson MRN: [id number] Date of Birth: Sep 23, 1960 Referring Provider (PT): Cornelious Bryant DO    Encounter Date: 10/02/2019   PT End of Session - 10/02/19 0856    Visit Number 8    Number of Visits 11    Date for PT Re-Evaluation 10/09/19    Authorization Type Medicaid approved for initial 3 visits.    Authorization Time Period 09/09/2019-09/29/2019    Authorization - Visit Number 3    PT Start Time 0848    PT Stop Time 0926    PT Time Calculation (min) 38 min    Activity Tolerance Patient tolerated treatment well    Behavior During Therapy [facility] for tasks assessed/performed           Past Medical History:  Diagnosis Date  . Diabetes mellitus without complication ([place])   . Essential hypertension     Past Surgical History:  Procedure Laterality Date  . KIDNEY DONATION    . MANDIBLE FRACTURE SURGERY    . ROTATOR CUFF REPAIR Right     There were no vitals filed for this visit.   Subjective Assessment - 10/02/19 0854    Subjective Patient has had some family issues that are causing him stress. 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 He hasn;t gotten much sleep.    Pertinent History DMII, remote kidney donatation    Limitations Standing;Walking    How long can you sit comfortably? At times he can sit; other times he has to shift around    How long can you walk comfortably? Painful with community ambulation    Diagnostic tests X-ray: moderate L4-L5 degeneration; Moderate Sacral degeneration bilateral    Currently in Pain? Yes    Pain Score 3     Pain Location Back    Pain Orientation Right;Left    Pain Descriptors / Indicators Aching;Dull    Pain Type Chronic pain    Pain Onset More than a month ago    Pain  Frequency Intermittent    Aggravating Factors  standing    Pain Relieving Factors rest    Effect of Pain on Daily Activities difficulty standing for long periods of time                             [facility] Adult PT Treatment/Exercise - 10/03/19 0001      Self-Care   Self-Care Other Self-Care Comments    Other Self-Care Comments  reviewed how to progress HEP, also reviewed how to progress HEP       Lumbar Exercises: Stretches   Passive Hamstring Stretch 2 reps;20 seconds    Single Knee to Chest Stretch Limitations reviewed single knee to chast stretch for HEP    Lower Trunk Rotation Limitations x20     Piriformis Stretch Limitations 2x20 sec hold bilateral       Lumbar Exercises: Aerobic   Nustep L3 UE.LE x 5 minutes       Lumbar Exercises: Standing   Heel Raises Limitations x20     Other Standing Lumbar Exercises standing march reviewe for home. Also reviewed standing exercises but patient fatigued from a longnight of work.       Lumbar Exercises: Supine   Bent Knee  Raise Limitations 2x10 with cuing for range       Manual Therapy   Manual Therapy Manual Traction;Soft tissue mobilization    Joint Mobilization side lying PA mobilization of lower lumbar spine ; ttmepted posterior hip mobilization but patient could not tolerate.; also perfromed to the right peri-scapular area and the mid thoracic paraspinals.     Soft tissue mobilization STM to upper gluteal and quadratus     Manual Traction grade II and III occialtions;                   PT Education - 10/02/19 0855    Education Details reviewed final HEP    Person(s) Educated Patient    Methods Explanation;Demonstration;Tactile cues;Verbal cues    Comprehension Returned demonstration;Verbalized understanding;Verbal cues required;Tactile cues required            PT Short Term Goals - 10/02/19 1005      PT SHORT TERM GOAL #1   Title Patient will increase passive right hip flexion to 90 degrees  without pain    Baseline 110 without pain    Time 3    Period Weeks    Status Achieved    Target Date 08/29/19      PT SHORT TERM GOAL #2   Title Patient will increase right hip gross strength to 4+/5    Baseline 5/5 gross    Time 3    Period Weeks    Status Achieved      PT SHORT TERM GOAL #3   Title Patient will be indepedendent with base HEP to improve strength and reduce inflammation    Baseline perfroming at home    Time 3    Period Weeks    Status Achieved    Target Date 09/18/19             PT Long Term Goals - 10/02/19 1006      PT LONG TERM GOAL #1   Title Patient will increase right hip active flexion to 110 degrees without pain in order to walk without increased pain    Baseline 110 without significant pain    Time 6    Period Weeks    Status Achieved      PT LONG TERM GOAL #2   Title 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 He has had a significant improvement in lumbar motion and right hip motion. 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 He was advised to use his stretches and tennis ball when he does that job. He is otherwise having miniaal pain. Therapy reviewed ther-ex with the patient. he didn't do many exercises todya because he just got off a strenous day at work.    Personal Factors and Comorbidities Comorbidity 1    Comorbidities DMII    Examination-Activity Limitations Stand;Squat;Sit    Stability/Clinical Decision Making Evolving/Moderate complexity    Clinical Decision Making Moderate    Rehab Potential Good    PT Frequency 1x / week    PT Duration 3 weeks  PT Treatment/Interventions ADLs/Self Care Home  Management;Cryotherapy;Electrical Stimulation;Iontophoresis 65m/ml Dexamethasone;Moist Heat;Ultrasound;Traction;DME Instruction;Stair training;Gait training;Therapeutic activities;Therapeutic exercise;Functional mobility training;Balance training;Neuromuscular re-education;Patient/family education;Manual techniques;Taping    PT Next Visit Plan D/C to HEP    Consulted and Agree with Plan of Care Patient           Patient will benefit from skilled therapeutic intervention in order to improve the following deficits and impairments:  Abnormal gait, Increased muscle spasms, Decreased activity tolerance, Decreased strength, Pain, Decreased range of motion, Decreased endurance, Difficulty walking  Visit Diagnosis: Chronic bilateral low back pain without sciatica  Pain in right hip  Other abnormalities of gait and mobility   PHYSICAL THERAPY DISCHARGE SUMMARY  Visits from Start of Care: 8  Current functional level related to goals / functional outcomes: Significant improvement in ability to perform work tasks    Remaining deficits: Mild pain in mid back when he performs prolonged activity    Education / Equipment: HEP   Plan: Patient agrees to discharge.  Patient goals were not met. Patient is being discharged due to meeting the stated rehab goals.  ?????       Problem List Patient Active Problem List   Diagnosis Date Noted  . Tobacco dependence 03/26/2019  . New onset of headaches after age 666812/11/2018  . Essential hypertension 03/26/2019  . Type 2 diabetes mellitus without complication, without long-term current use of insulin ([place] 03/26/2019  . Aneurysm, cerebral, nonruptured 03/26/2019    DCarney LivingPT DPT  10/03/2019, 11:45 AM  [facility]171 [address] [address]Phone: [phone]  Fax:  [phone] Name: Curtis TaddeiMRN: [id number] of Birth: 103/19/1962

## 2019-10-11 ENCOUNTER — Telehealth: Payer: Self-pay

## 2019-10-11 NOTE — Telephone Encounter (Signed)

## 2019-10-14 ENCOUNTER — Ambulatory Visit (INDEPENDENT_AMBULATORY_CARE_PROVIDER_SITE_OTHER): Payer: Medicaid Other | Admitting: Internal Medicine

## 2019-10-14 ENCOUNTER — Encounter: Payer: Self-pay | Admitting: Internal Medicine

## 2019-10-14 ENCOUNTER — Other Ambulatory Visit: Payer: Self-pay

## 2019-10-14 VITALS — BP 104/74 | HR 91 | Temp 97.3°F | Resp 16 | Wt 205.0 lb

## 2019-10-14 DIAGNOSIS — E119 Type 2 diabetes mellitus without complications: Secondary | ICD-10-CM | POA: Diagnosis not present

## 2019-10-14 DIAGNOSIS — I10 Essential (primary) hypertension: Secondary | ICD-10-CM

## 2019-10-14 DIAGNOSIS — M545 Low back pain, unspecified: Secondary | ICD-10-CM

## 2019-10-14 DIAGNOSIS — E785 Hyperlipidemia, unspecified: Secondary | ICD-10-CM

## 2019-10-14 DIAGNOSIS — E1169 Type 2 diabetes mellitus with other specified complication: Secondary | ICD-10-CM

## 2019-10-14 DIAGNOSIS — Z1211 Encounter for screening for malignant neoplasm of colon: Secondary | ICD-10-CM | POA: Diagnosis not present

## 2019-10-14 DIAGNOSIS — Z23 Encounter for immunization: Secondary | ICD-10-CM | POA: Diagnosis not present

## 2019-10-14 LAB — POCT GLYCOSYLATED HEMOGLOBIN (HGB A1C): Hemoglobin A1C: 8.8 % — AB (ref 4.0–5.6)

## 2019-10-14 LAB — GLUCOSE, POCT (MANUAL RESULT ENTRY): POC Glucose: 301 mg/dl — AB (ref 70–99)

## 2019-10-14 MED ORDER — EMPAGLIFLOZIN 10 MG PO TABS: 10.0000 mg | ORAL_TABLET | Freq: Every day | ORAL | 2 refills | Status: DC

## 2019-10-14 NOTE — Progress Notes (Signed)
Subjective:    Curtis Williamson - 59 y.o. male MRN [id number]  Date of birth: 1960/07/18  HPI  Curtis Williamson is here for follow up of his chronic medical conditions.  Diabetes mellitus, Type 2 Disease Monitoring             Blood Sugar Ranges: Fasting - 130-140s             Polyuria: no             Visual problems: no   Urine Microalbumin <3 (Dec 2020)   Last A1C: 8.6 (March 2021)   Medications: Metformin 1000 mg BID  Medication Compliance: yes  Medication Side Effects             Hypoglycemia: no   Chronic HTN Disease Monitoring:  Home BP Monitoring - Has a cuff but does not monitor  Chest pain- no  Dyspnea- no Headache - no  Medications: Lisinopril 10 mg, HCTZ 12.5 mg  Compliance- yes Lightheadedness- no  Edema- no     Health Maintenance:  Health Maintenance Due  Topic Date Due  . PNEUMOCOCCAL POLYSACCHARIDE VACCINE AGE 74-64 HIGH RISK  Never done  . OPHTHALMOLOGY EXAM  Never done  . COVID-19 Vaccine (1) Never done  . COLONOSCOPY  Never done    -  reports that he has been smoking. He has never used smokeless tobacco. - Review of Systems: Per HPI. - Past Medical History: Patient Active Problem List   Diagnosis Date Noted  . Tobacco dependence 03/26/2019  . Essential hypertension 03/26/2019  . Type 2 diabetes mellitus without complication, without long-term current use of insulin ([place]) 03/26/2019  . Aneurysm, cerebral, nonruptured 03/26/2019   - Medications: reviewed and updated   Objective:   Physical Exam BP 104/74   Pulse 91   Temp (!) 97.3 F (36.3 C) (Temporal)   Resp 16   Wt 205 lb (93 kg)   SpO2 96%   BMI 28.59 kg/m  Physical Exam Constitutional:      General: He is not in acute distress.    Appearance: He is not diaphoretic.  Cardiovascular:     Rate and Rhythm: Normal rate.  Pulmonary:     Effort: Pulmonary effort is normal. No respiratory distress.  Musculoskeletal:        General: Normal range of motion.   Skin:    General: Skin is warm and dry.  Neurological:     Mental Status: He is alert and oriented to person, place, and time.  Psychiatric:        Mood and Affect: Affect normal.        Judgment: Judgment normal.            Assessment & Plan:   1. Type 2 diabetes mellitus without complication, without long-term current use of insulin (HCC) A1c with worsening trend of glycemic control with result of 8.8%. Will add Jardiance to patient's regimen. Obtain BMET for baseline renal function, has been previously normal about 6 months ago. Discussed potential for UTIs with medication and need for adequate hydration; hold for periods of volume depletion.  - Glucose (CBG) - HgB A1c - HM Diabetes Foot Exam - Ambulatory referral to Ophthalmology - empagliflozin (JARDIANCE) 10 MG TABS tablet; Take 1 tablet (10 mg total) by mouth daily before breakfast.  Dispense: 30 tablet; Refill: 2 - Basic Metabolic Panel - Pneumococcal polysaccharide vaccine 23-valent greater than or equal to 2yo subcutaneous/IM  2. Essential hypertension BP at goal today. Continue current regimen.  3. Hyperlipidemia associated with type 2 diabetes mellitus (HCC) Noted that last LDL was above goal in Dec 2020 with result of 119. Will plan to repeat at next visit and increase Lipitor if needed.   4. Colon cancer screening - Ambulatory referral to Gastroenterology  5. Need for prophylactic vaccination against Streptococcus pneumoniae (pneumococcus) - Pneumococcal polysaccharide vaccine 23-valent greater than or equal to 2yo subcutaneous/IM  6. Acute right-sided low back pain, unspecified whether sciatica present Patient reports this has been improving since I referred to orthopedics. He has completed PT and is doing home exercises. Has f/u with Ortho scheduled in July.     Marcy Siren, D.O. 10/14/2019, 9:35 AM Primary Care at [facility]

## 2019-10-14 NOTE — Patient Instructions (Signed)

## 2019-10-15 LAB — BASIC METABOLIC PANEL
BUN/Creatinine Ratio: 10 (ref 10–24)
BUN: 12 mg/dL (ref 8–27)
CO2: 26 mmol/L (ref 20–29)
Calcium: 9.6 mg/dL (ref 8.6–10.2)
Chloride: 101 mmol/L (ref 96–106)
Creatinine, Ser: 1.24 mg/dL (ref 0.76–1.27)
GFR calc Af Amer: 73 mL/min/{1.73_m2} (ref >59)
GFR calc non Af Amer: 63 mL/min/{1.73_m2} (ref >59)
Glucose: 294 mg/dL — ABNORMAL HIGH (ref 65–99)
Potassium: 4.4 mmol/L (ref 3.5–5.2)
Sodium: 138 mmol/L (ref 134–144)

## 2019-10-16 NOTE — Progress Notes (Signed)
Patient notified of results & recommendations. Expressed understanding.

## 2019-10-29 ENCOUNTER — Other Ambulatory Visit: Payer: Self-pay | Admitting: Student

## 2019-10-30 ENCOUNTER — Encounter: Payer: Self-pay | Admitting: Gastroenterology

## 2019-11-03 ENCOUNTER — Ambulatory Visit
Admission: RE | Admit: 2019-11-03 | Discharge: 2019-11-03 | Disposition: A | Discharge status: Home / Self Care | Payer: Medicaid Other | Source: Ambulatory Visit | Attending: Neurology | Admitting: Neurology

## 2019-11-03 DIAGNOSIS — I671 Cerebral aneurysm, nonruptured: Secondary | ICD-10-CM

## 2019-11-03 DIAGNOSIS — I771 Stricture of artery: Secondary | ICD-10-CM | POA: Diagnosis not present

## 2019-11-03 DIAGNOSIS — Q283 Other malformations of cerebral vessels: Secondary | ICD-10-CM | POA: Diagnosis not present

## 2019-11-03 DIAGNOSIS — I6501 Occlusion and stenosis of right vertebral artery: Secondary | ICD-10-CM | POA: Diagnosis not present

## 2019-11-03 IMAGING — MR MR MRA HEAD W/O CM
1 series · 23 of 48 positions shown · non-contrast
Comparison: [DATE] head and neck CTA

CLINICAL DATA: Cerebral aneurysm.

EXAM:
MRA HEAD WITHOUT CONTRAST
TECHNIQUE: Angiographic images of the Circle of Willis were obtained using MRA
technique without intravenous contrast.

[Series 3: tof_3d_multi-slab · axial · 0.7mm · 0.35mm/px · z∈[-73,+30]mm · 23 of 156 slices shown]
[im 1/156]
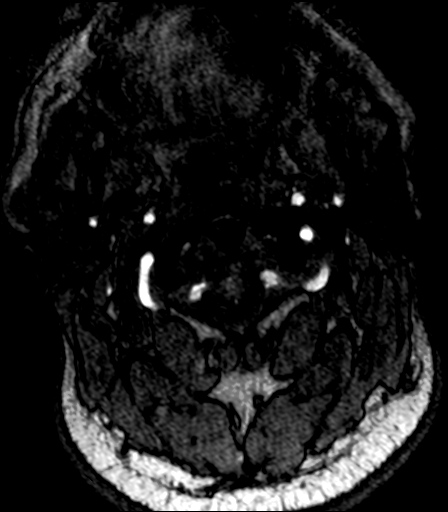
[im 4/156]
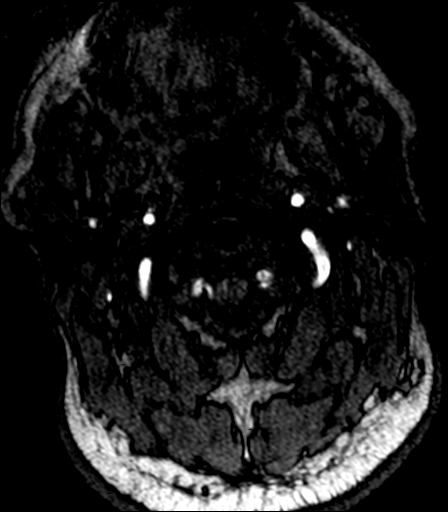
[im 7/156]
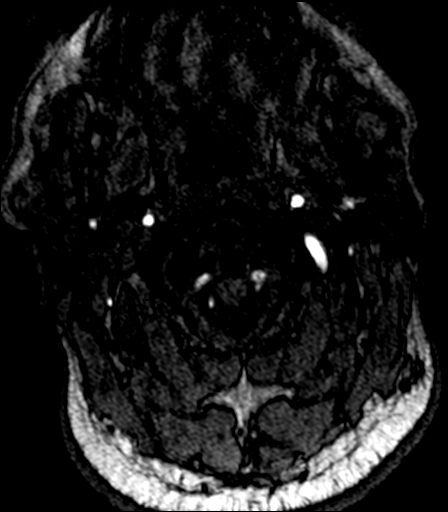
[im 10/156]
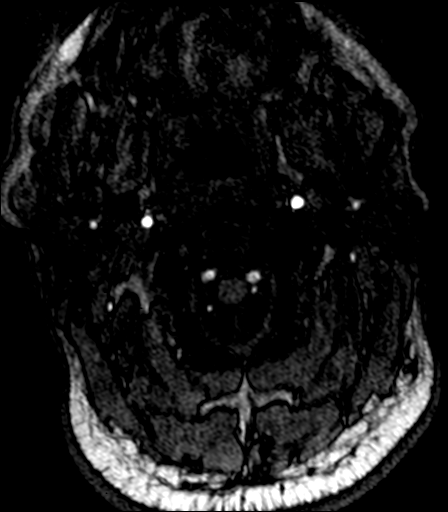
[im 14/156]
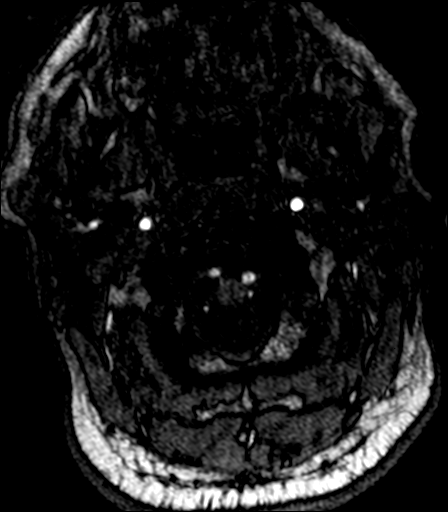
[im 17/156]
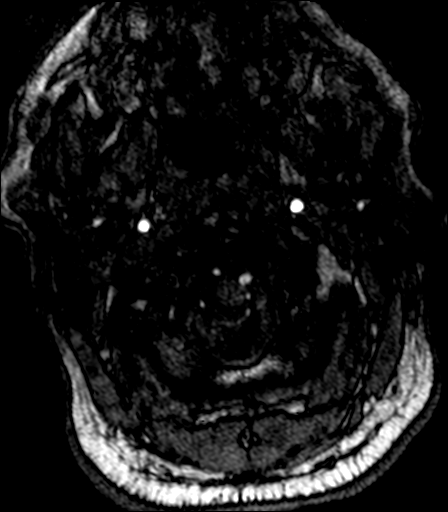
[im 20/156]
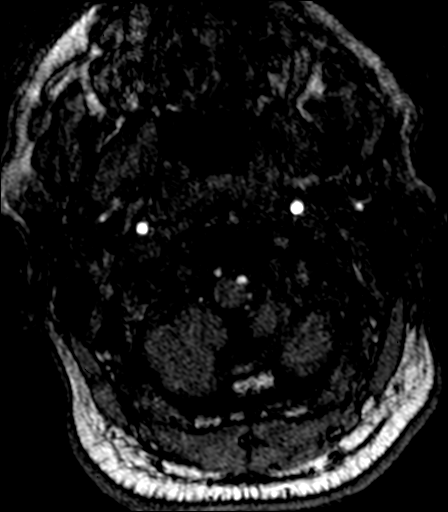
[im 24/156]
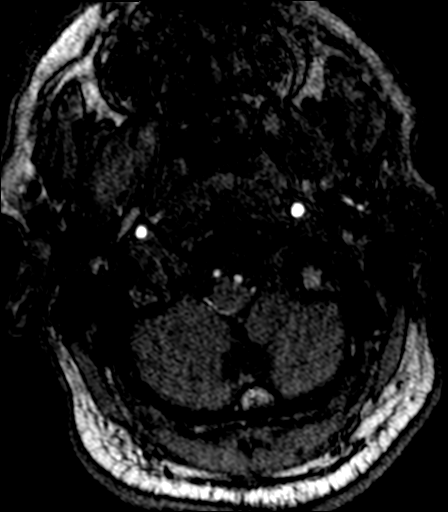
[im 27/156]
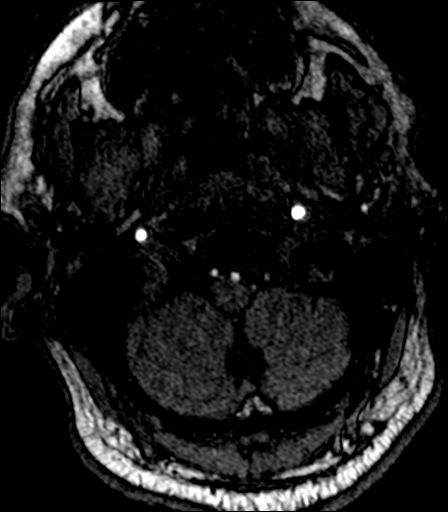
[im 30/156]
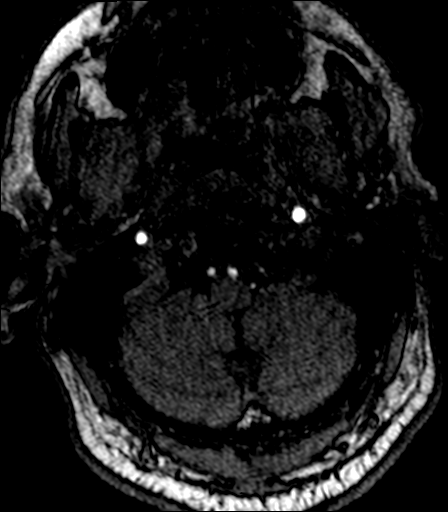
[im 33/156]
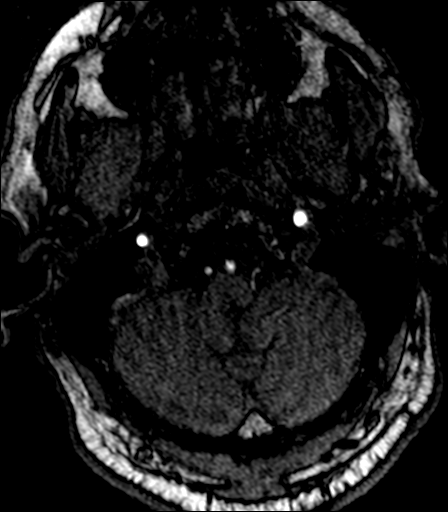
[im 37/156]
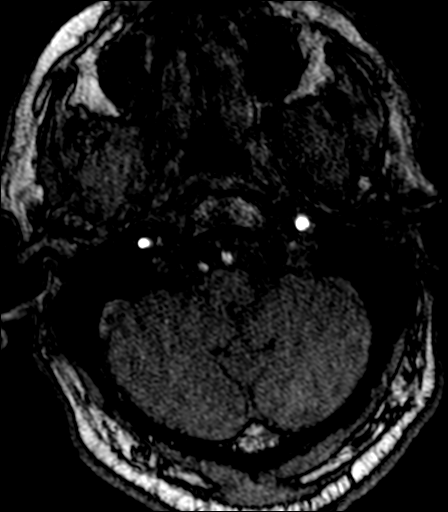
[im 40/156]
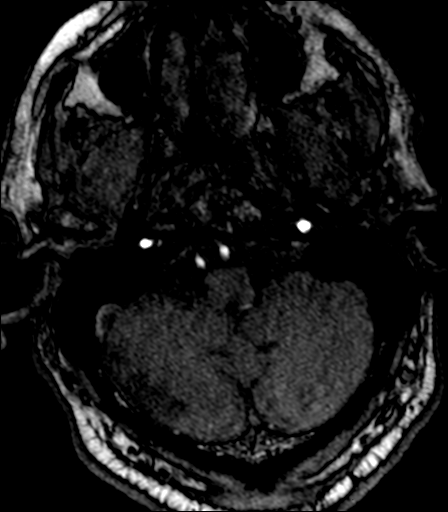
[im 43/156]
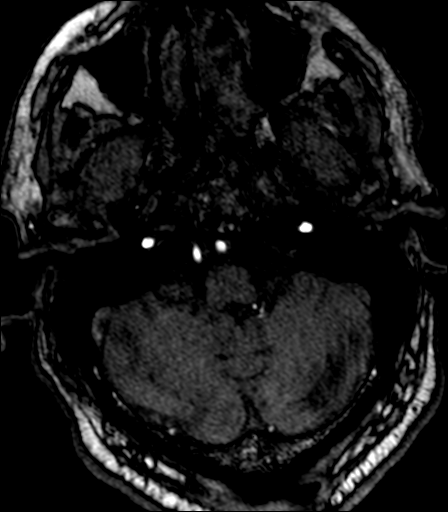
[im 47/156]
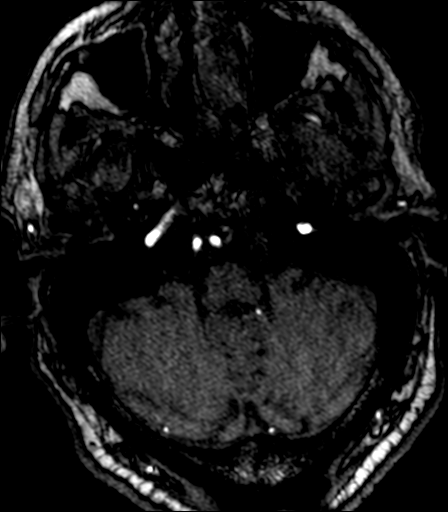
[im 50/156]
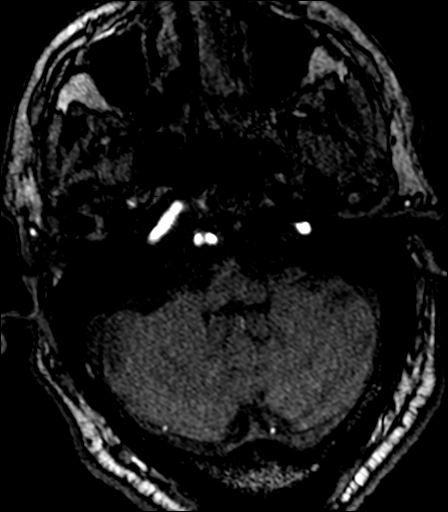
[im 70/156]
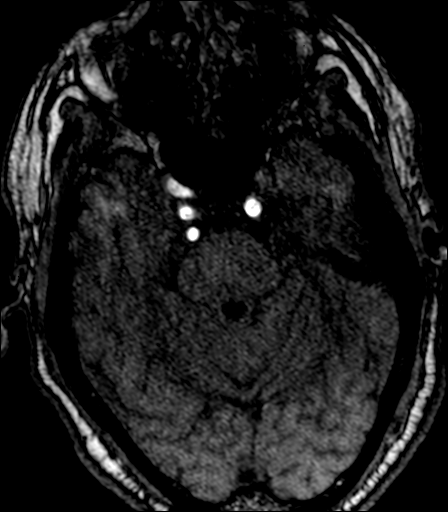
[im 80/156]
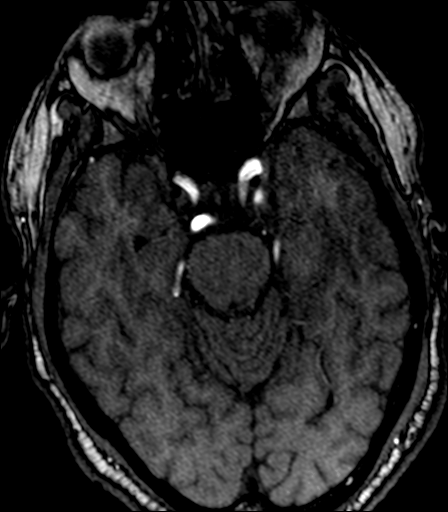
[im 90/156]
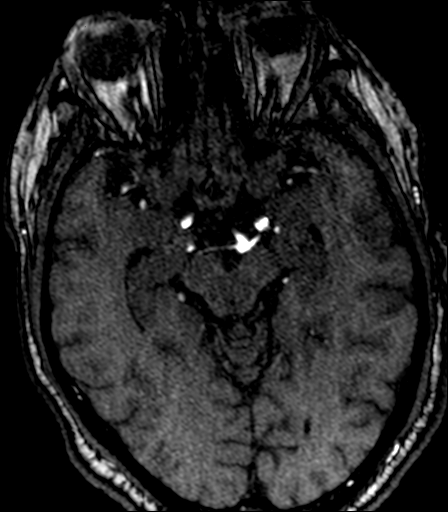
[im 109/156]
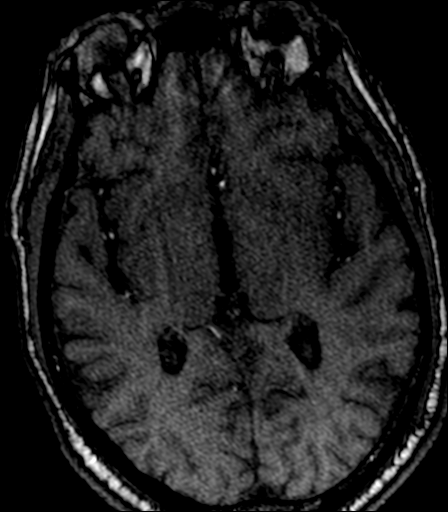
[im 129/156]
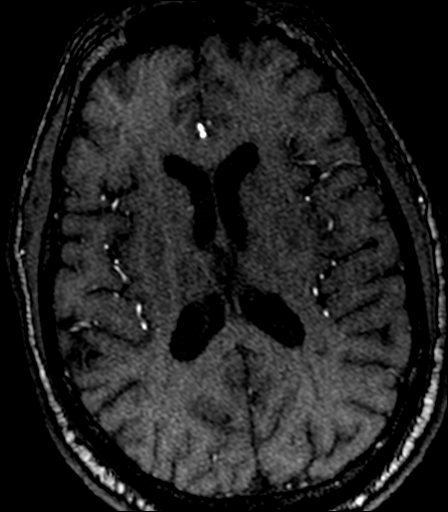
[im 132/156]
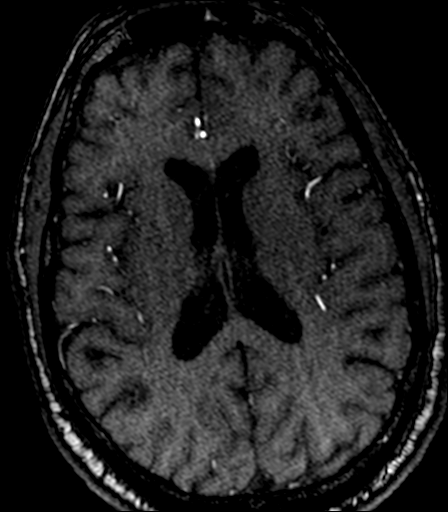
[im 149/156]
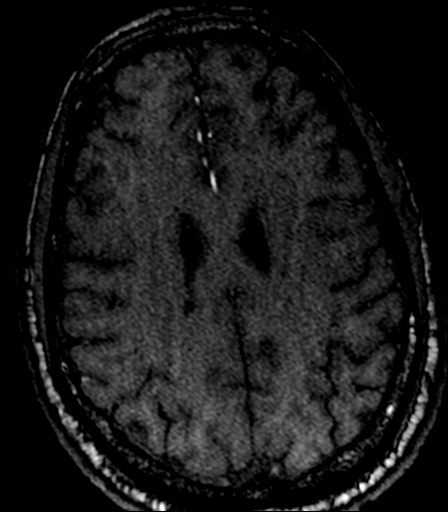

[23 of 48 positions shown; findings below may reference images not displayed]

FINDINGS: The visualized distal vertebral arteries are patent to the basilar
with the left being mildly dominant. An apparent moderate focal
stenosis in the right V4 segment was not present on the prior CTA
and may be artifactual. Patent P ICAs and SCA is are seen
bilaterally. The basilar artery is widely patent and tortuous.
Posterior communicating arteries are not identified and may be
diminutive or absent. Both PCAs are patent without evidence of a
significant proximal stenosis.

The internal carotid arteries are widely patent from skull base to
carotid termini. A 1.5 mm bulge along the left supraclinoid ICA in
the posterior communicating region is unchanged without a visible
vessel arising from it. ACAs and MCAs are patent with mild branch
vessel irregularity and attenuation but no evidence of a proximal
branch occlusion or significant proximal stenosis. The right A1
segment is hypoplastic.
IMPRESSION: 1. Unchanged 1.5 mm left supraclinoid ICA infundibulum versus
aneurysm.
2. Moderate right V4 stenosis versus artifact.

## 2019-11-05 NOTE — Progress Notes (Signed)
NEUROLOGY FOLLOW UP OFFICE NOTE  Curtis Williamson [id number]  HISTORY OF PRESENT ILLNESS: Curtis Williamson is a 59 year old right-handed male with hypertension, diabetes mellitus and tobacco use who follows up for cerebral aneurysm.  UPDATE: MRA of head on 11/03/2019 personally reviewed demonstrated stable 1.5 mm left supraclinoid ICA infundibulum versus aneurysm.  He is doing well.  HISTORY: He moved to [place] from [place] in September 2020.  Around August, he started having headaches.  At first they were mild and infrequent, but gradually became more intense and frequent.  He presented to [facility] ED on 02/26/2019 for 3 days of intermittent dizziness and lightheadedness as well as severe pulsating left temporal and retro-orbital headache.  No associated slurred speech, facial droop or unilateral numbness or weakness.  He had been out of his Metformin for 3 days.  CTA of head and neck performed was personally reviewed and showed a 2 mm aneurysm vs infundibulum of the supraclinoid intracranial left ICA but no acute abnormality or large vessel high-grade stenosis, occlusion or dissection.  He was advised to follow up with his PCP regarding the CT finding. He had a repeat head CT on 04/02/2019 which showed no acute intracranial abnormality.  Headaches started to dissipate by end of December.  He reports decreased emotional stress.  Subsequently, headaches have been mild, infrequent and lasting up to 30 minutes.    He is not aware of any family history of aneurysms.  Of note, when he was still in [place], he had a couple of episodes of syncope.  They occurred during work rehabilitation (he had been out of work for a year due to shoulder surgery.  It   PAST MEDICAL HISTORY: Past Medical History:  Diagnosis Date  . Diabetes mellitus without complication ([place])   . Essential hypertension     MEDICATIONS: Current Outpatient Medications on File Prior to Visit  Medication Sig  Dispense Refill  . atorvastatin (LIPITOR) 10 MG tablet Take 1 tablet (10 mg total) by mouth daily. 90 tablet 1  . baclofen (LIORESAL) 10 MG tablet Take 0.5-1 tablets (5-10 mg total) by mouth 3 (three) times daily as needed for muscle spasms. 30 each 3  . Blood Glucose Monitoring Suppl (ACCU-CHEK GUIDE ME) w/Device KIT 1 each by Does not apply route 2 (two) times daily. Use to check FSBS twice a day. Dx: E11.9 1 kit 0  . empagliflozin (JARDIANCE) 10 MG TABS tablet Take 1 tablet (10 mg total) by mouth daily before breakfast. 30 tablet 2  . fluticasone (FLONASE) 50 MCG/ACT nasal spray Place 2 sprays into both nostrils daily. 16 g 6  . gabapentin (NEURONTIN) 300 MG capsule 1 PO q HS, may increase to 1 PO TID if needed 90 capsule 3  . glucose blood (ACCU-CHEK GUIDE) test strip Use to check FSBS twice a day. Dx: E11.9 200 each 1  . hydrOXYzine (ATARAX/VISTARIL) 25 MG tablet Take 1 tablet (25 mg total) by mouth every 6 (six) hours. 120 tablet 2  . Lancets (ACCU-CHEK SOFT TOUCH) lancets Use to check FSBS twice a day. Dx: E11.9 200 each 1  . lisinopril-hydrochlorothiazide (ZESTORETIC) 10-12.5 MG tablet Take 1 tablet by mouth daily. 90 tablet 1  . metFORMIN (GLUCOPHAGE) 1000 MG tablet Take 1 tablet (1,000 mg total) by mouth 2 (two) times daily with a meal. 180 tablet 3  . triamcinolone cream (KENALOG) 0.1 % Apply 1 application topically 2 (two) times daily. 30 g 2   No current facility-administered medications on file prior  to visit.    ALLERGIES: Allergies  Allergen Reactions  . Mushroom Extract Complex     FAMILY HISTORY: Family History  Problem Relation Age of Onset  . Hypertension Mother   . Kidney failure Mother   . Kidney disease Mother   . Heart disease Mother   . ADD / ADHD Daughter   . Anxiety disorder Daughter   . Alcohol abuse Father     SOCIAL HISTORY: Social History   Socioeconomic History  . Marital status: Single    Spouse name: Not on file  . Number of children: 1  .  Years of education: Not on file  . Highest education level: Some college, no degree  Occupational History  . Occupation: Truck Geophysicist/field seismologist  Tobacco Use  . Smoking status: Current Every Day Smoker  . Smokeless tobacco: Never Used  Vaping Use  . Vaping Use: Never used  Substance and Sexual Activity  . Alcohol use: Yes  . Drug use: Never  . Sexual activity: Not on file  Other Topics Concern  . Not on file  Social History Narrative   Right handed   One story home   No caffeine    Social Determinants of Health   Financial Resource Strain:   . Difficulty of Paying Living Expenses:   Food Insecurity:   . Worried About Charity fundraiser in the Last Year:   . Arboriculturist in the Last Year:   Transportation Needs:   . Film/video editor (Medical):   Marland Kitchen Lack of Transportation (Non-Medical):   Physical Activity:   . Days of Exercise per Week:   . Minutes of Exercise per Session:   Stress:   . Feeling of Stress :   Social Connections:   . Frequency of Communication with Friends and Family:   . Frequency of Social Gatherings with Friends and Family:   . Attends Religious Services:   . Active Member of Clubs or Organizations:   . Attends Archivist Meetings:   Marland Kitchen Marital Status:   Intimate Partner Violence:   . Fear of Current or Ex-Partner:   . Emotionally Abused:   Marland Kitchen Physically Abused:   . Sexually Abused:    PHYSICAL EXAM: Blood pressure 109/79, pulse 90, height 5' 11"  (1.803 m), weight 210 lb (95.3 kg), SpO2 97 %. General: No acute distress.  Patient appears well-groomed.   Head:  Normocephalic/atraumatic Eyes:  Fundi examined but not visualized Neck: supple, no paraspinal tenderness, full range of motion Heart:  Regular rate and rhythm Lungs:  Clear to auscultation bilaterally Back: No paraspinal tenderness Neurological Exam: alert and oriented to person, place, and time. Attention span and concentration intact, recent and remote memory intact, fund of  knowledge intact.  Speech fluent and not dysarthric, language intact.  CN II-XII intact. Bulk and tone normal, muscle strength 5/5 throughout.  Sensation to light touch, temperature and vibration intact.  Deep tendon reflexes 2+ throughout, toes downgoing.  Finger to nose and heel to shin testing intact.  Gait normal, Romberg negative.  IMPRESSION: 1.  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  Stable.  PLAN: 1.  Repeat MRA of head in one year 2.  Follow up after repeat MRA in one year  Metta Clines, DO  CC: Phill Myron, DO

## 2019-11-07 ENCOUNTER — Encounter: Payer: Self-pay | Admitting: Neurology

## 2019-11-07 ENCOUNTER — Ambulatory Visit (INDEPENDENT_AMBULATORY_CARE_PROVIDER_SITE_OTHER): Payer: Medicaid Other | Admitting: Neurology

## 2019-11-07 ENCOUNTER — Other Ambulatory Visit: Payer: Self-pay

## 2019-11-07 VITALS — BP 109/79 | HR 90 | Ht 71.0 in | Wt 210.0 lb

## 2019-11-07 DIAGNOSIS — I671 Cerebral aneurysm, nonruptured: Secondary | ICD-10-CM

## 2019-11-07 NOTE — Patient Instructions (Signed)
The aneurysm is tiny and stable.   Repeat MRA of head in one year Follow up afterwards.

## 2019-12-06 ENCOUNTER — Telehealth: Payer: Self-pay | Admitting: Internal Medicine

## 2019-12-06 MED ORDER — ACCU-CHEK SOFT TOUCH LANCETS MISC: 1 refills | Status: DC

## 2019-12-06 MED ORDER — ACCU-CHEK GUIDE VI STRP: ORAL_STRIP | 1 refills | Status: DC

## 2019-12-06 NOTE — Telephone Encounter (Signed)
1) Medication(s) Requested (by name): glucose blood (ACCU-CHEK GUIDE) test strip  Lancets (ACCU-CHEK SOFT TOUCH) lancets    2) Pharmacy of Choice:[id number] - [place], [place] - [address]   3) Special Requests:   Approved medications will be sent to the pharmacy, we will reach out if there is an issue.  Requests made after 3pm may not be addressed until the following business day!  If a patient is unsure of the name of the medication(s) please note and ask patient to call back when they are able to provide all info, do not send to responsible party until all information is available!

## 2019-12-06 NOTE — Telephone Encounter (Signed)
Refills sent

## 2019-12-17 ENCOUNTER — Other Ambulatory Visit: Payer: Self-pay

## 2019-12-17 ENCOUNTER — Ambulatory Visit (AMBULATORY_SURGERY_CENTER): Payer: Medicaid Other | Admitting: *Deleted

## 2019-12-17 VITALS — Ht 71.0 in | Wt 204.0 lb

## 2019-12-17 DIAGNOSIS — Z1211 Encounter for screening for malignant neoplasm of colon: Secondary | ICD-10-CM

## 2019-12-17 MED ORDER — NA SULFATE-K SULFATE-MG SULF 17.5-3.13-1.6 GM/177ML PO SOLN: 1.0000 | Freq: Once | ORAL | 0 refills | Status: AC

## 2019-12-17 NOTE — Progress Notes (Signed)
Pt had colonoscopy 10 + years ago.  Normal.   No egg or soy allergy known to patient  No issues with past sedation with any surgeries or procedures no intubation problems in the past  No FH of Malignant Hyperthermia No diet pills per patient No home 02 use per patient  No blood thinners per patient  Pt denies issues with constipation  No A fib or A flutter  EMMI video to pt or via MyChart  COVID 19 guidelines implemented in PV today with Pt and RN   Due to the COVID-19 pandemic we are asking patients to follow these guidelines. Please only bring one care partner. Please be aware that your care partner may wait in the car in the parking lot or if they feel like they will be too hot to wait in the car, they may wait in the lobby on the 4th floor. All care partners are required to wear a mask the entire time (we do not have any that we can provide them), they need to practice social distancing, and we will do a Covid check for all patient's and care partners when you arrive. Also we will check their temperature and your temperature. If the care partner waits in their car they need to stay in the parking lot the entire time and we will call them on their cell phone when the patient is ready for discharge so they can bring the car to the front of the building. Also all patient's will need to wear a mask into building.

## 2019-12-31 ENCOUNTER — Encounter: Payer: Medicaid Other | Admitting: Gastroenterology

## 2020-01-16 ENCOUNTER — Telehealth (INDEPENDENT_AMBULATORY_CARE_PROVIDER_SITE_OTHER): Payer: Medicaid Other | Admitting: Internal Medicine

## 2020-01-16 ENCOUNTER — Encounter: Payer: Self-pay | Admitting: Internal Medicine

## 2020-01-16 DIAGNOSIS — E785 Hyperlipidemia, unspecified: Secondary | ICD-10-CM

## 2020-01-16 DIAGNOSIS — E1169 Type 2 diabetes mellitus with other specified complication: Secondary | ICD-10-CM

## 2020-01-16 DIAGNOSIS — I10 Essential (primary) hypertension: Secondary | ICD-10-CM

## 2020-01-16 DIAGNOSIS — E119 Type 2 diabetes mellitus without complications: Secondary | ICD-10-CM

## 2020-01-16 NOTE — Progress Notes (Signed)
Virtual Visit via Telephone Note  I connected with Curtis Williamson, on 01/16/2020 at 9:19 AM by telephone due to the COVID-19 pandemic and verified that I am speaking with the correct person using two identifiers.   Consent: I discussed the limitations, risks, security and privacy concerns of performing an evaluation and management service by telephone and the availability of in person appointments. I also discussed with the patient that there may be a patient responsible charge related to this service. The patient expressed understanding and agreed to proceed.   Location of Patient: Home   Location of Provider: Clinic    Persons participating in Telemedicine visit: Peterson Mathey [facility] Dr. Juleen China      History of Present Illness: Patient has a visit to f/u chronic medical conditions.   Diabetes mellitus, Type 2 Disease Monitoring             Blood Sugar Ranges: Fasting - Not monitoring due to work schedule             Polyuria: no              Visual problems: no   Urine Microalbumin <3 (Dec 2020)   Last A1C: 8.8 (June 2021)   Medications: Metformin 1000 mg BID, Jardiance 10 mg  Medication Compliance: no, reports that he has had poor, sporadic compliance due to work schedule---he knows that he needs to do better and create a new stable schedule   Medication Side Effects             Hypoglycemia: no   Chronic HTN Disease Monitoring:  Home BP Monitoring - Does not monitor.  Chest pain- no  Dyspnea- no Headache - no  Medications: Lisinopril 10 mg, HCTZ 12.5 mg  Compliance- no, intermittent due to work schedule as above  Lightheadedness- no  Edema- no    Past Medical History:  Diagnosis Date   Chronic kidney disease    only has one kidney   Diabetes mellitus without complication ([place])    Essential hypertension    Hyperlipidemia    Hypertension    Phreesia 01/13/2020   Allergies  Allergen Reactions   Mushroom  Extract Complex     Current Outpatient Medications on File Prior to Visit  Medication Sig Dispense Refill   atorvastatin (LIPITOR) 10 MG tablet Take 1 tablet (10 mg total) by mouth daily. 90 tablet 1   baclofen (LIORESAL) 10 MG tablet Take 0.5-1 tablets (5-10 mg total) by mouth 3 (three) times daily as needed for muscle spasms. 30 each 3   Blood Glucose Monitoring Suppl (ACCU-CHEK GUIDE ME) w/Device KIT 1 each by Does not apply route 2 (two) times daily. Use to check FSBS twice a day. Dx: E11.9 1 kit 0   empagliflozin (JARDIANCE) 10 MG TABS tablet Take 1 tablet (10 mg total) by mouth daily before breakfast. 30 tablet 2   fluticasone (FLONASE) 50 MCG/ACT nasal spray Place 2 sprays into both nostrils daily. 16 g 6   gabapentin (NEURONTIN) 300 MG capsule 1 PO q HS, may increase to 1 PO TID if needed 90 capsule 3   glucose blood (ACCU-CHEK GUIDE) test strip Use to check FSBS twice a day. Dx: E11.9 200 each 1   hydrOXYzine (ATARAX/VISTARIL) 25 MG tablet Take 1 tablet (25 mg total) by mouth every 6 (six) hours. 120 tablet 2   Lancets (ACCU-CHEK SOFT TOUCH) lancets Use to check FSBS twice a day. Dx: E11.9 200 each 1   lisinopril-hydrochlorothiazide (ZESTORETIC) 10-12.5 MG tablet  Take 1 tablet by mouth daily. 90 tablet 1   metFORMIN (GLUCOPHAGE) 1000 MG tablet Take 1 tablet (1,000 mg total) by mouth 2 (two) times daily with a meal. 180 tablet 3   triamcinolone cream (KENALOG) 0.1 % Apply 1 application topically 2 (two) times daily. 30 g 2   No current facility-administered medications on file prior to visit.    Observations/Objective: NAD. Speaking clearly.  Work of breathing normal.  Alert and oriented. Mood appropriate.   Assessment and Plan: 1. Type 2 diabetes mellitus without complication, without long-term current use of insulin ([place]) Patient has had worsening DM control---suspect A1c to be poor due to lack of compliance related to work schedule. We discussed tactics to optimize  compliance and encouraged to prevent future complications. Begin monitoring fasting CBGs.  Counseled on Diabetic diet, my plate method, 929 minutes of moderate intensity exercise/week Blood sugar logs with fasting goals of 80-120 mg/dl, random of less than 180 and in the event of sugars less than 60 mg/dl or greater than 400 mg/dl encouraged to notify the clinic. Advised on the need for annual eye exams, annual foot exams, Pneumonia vaccine. - Hemoglobin A1c; Future - Comprehensive metabolic panel; Future  2. Essential hypertension Unclear what his trend is as he is not monitoring at home. Was at goal at previous office visit. Asymptomatic. Continue current regimen.  - CBC with Differential; Future - Comprehensive metabolic panel; Future  3. Hyperlipidemia associated with type 2 diabetes mellitus (HCC) Continue Lipitor 10 mg.    Follow Up Instructions: Lab visit    I discussed the assessment and treatment plan with the patient. The patient was provided an opportunity to ask questions and all were answered. The patient agreed with the plan and demonstrated an understanding of the instructions.   The patient was advised to call back or seek an in-person evaluation if the symptoms worsen or if the condition fails to improve as anticipated.     I provided 24 minutes total of non-face-to-face time during this encounter including median intraservice time, reviewing previous notes, investigations, ordering medications, medical decision making, coordinating care and patient verbalized understanding at the end of the visit.    Phill Myron, D.O. Primary Care at [facility]  01/16/2020, 9:19 AM

## 2020-01-20 ENCOUNTER — Telehealth: Payer: Self-pay

## 2020-01-20 ENCOUNTER — Ambulatory Visit: Payer: Medicaid Other | Admitting: Registered"

## 2020-01-20 MED ORDER — BACLOFEN 10 MG PO TABS: 5.0000 mg | ORAL_TABLET | Freq: Three times a day (TID) | ORAL | 3 refills | Status: DC | PRN

## 2020-01-20 NOTE — Telephone Encounter (Signed)
Patient called he is requesting prescription refll for Baclofen and a handicap placard his expires soon. Call [phone]

## 2020-01-20 NOTE — Telephone Encounter (Signed)
I called and advised the patient of the refill and the new signed application - the patient will come by tomorrow to pick up the handicap placard application.

## 2020-01-20 NOTE — Telephone Encounter (Signed)
Please advise 

## 2020-01-20 NOTE — Telephone Encounter (Signed)
Done

## 2020-01-21 ENCOUNTER — Other Ambulatory Visit: Payer: Medicaid Other

## 2020-01-22 ENCOUNTER — Telehealth: Payer: Self-pay | Admitting: Gastroenterology

## 2020-01-22 ENCOUNTER — Encounter: Payer: Medicaid Other | Admitting: Gastroenterology

## 2020-01-22 ENCOUNTER — Other Ambulatory Visit: Payer: Medicaid Other

## 2020-01-22 NOTE — Telephone Encounter (Signed)
Thanks for letting me know.  This is a same day cancellation.  One chance to reschedule.

## 2020-01-22 NOTE — Telephone Encounter (Signed)
Called pt at 12:45 pm  and spoke with pt regarding his appointment for today @ 1:30 pm .  Pt stated he thought appointment was rescheduled for the 17th of Oct.  Pt stated he will call back to reschedule appointment.Curtis Williamson

## 2020-01-23 ENCOUNTER — Other Ambulatory Visit (INDEPENDENT_AMBULATORY_CARE_PROVIDER_SITE_OTHER): Payer: Medicaid Other

## 2020-01-23 ENCOUNTER — Other Ambulatory Visit: Payer: Self-pay

## 2020-01-23 DIAGNOSIS — I10 Essential (primary) hypertension: Secondary | ICD-10-CM

## 2020-01-23 DIAGNOSIS — E119 Type 2 diabetes mellitus without complications: Secondary | ICD-10-CM

## 2020-01-24 LAB — COMPREHENSIVE METABOLIC PANEL
ALT: 53 IU/L — ABNORMAL HIGH (ref 0–44)
AST: 36 IU/L (ref 0–40)
Albumin/Globulin Ratio: 1.5 (ref 1.2–2.2)
Albumin: 4.4 g/dL (ref 3.8–4.9)
Alkaline Phosphatase: 93 IU/L (ref 44–121)
BUN/Creatinine Ratio: 11 (ref 10–24)
BUN: 13 mg/dL (ref 8–27)
Bilirubin Total: <0.2 mg/dL (ref 0.0–1.2)
CO2: 27 mmol/L (ref 20–29)
Calcium: 9.6 mg/dL (ref 8.6–10.2)
Chloride: 102 mmol/L (ref 96–106)
Creatinine, Ser: 1.21 mg/dL (ref 0.76–1.27)
GFR calc Af Amer: 75 mL/min/{1.73_m2} (ref >59)
GFR calc non Af Amer: 65 mL/min/{1.73_m2} (ref >59)
Globulin, Total: 2.9 g/dL (ref 1.5–4.5)
Glucose: 158 mg/dL — ABNORMAL HIGH (ref 65–99)
Potassium: 4.1 mmol/L (ref 3.5–5.2)
Sodium: 142 mmol/L (ref 134–144)
Total Protein: 7.3 g/dL (ref 6.0–8.5)

## 2020-01-24 LAB — CBC WITH DIFFERENTIAL/PLATELET
Basophils Absolute: 0.1 10*3/uL (ref 0.0–0.2)
Basos: 1 %
EOS (ABSOLUTE): 0.5 10*3/uL — ABNORMAL HIGH (ref 0.0–0.4)
Eos: 5 %
Hematocrit: 44.9 % (ref 37.5–51.0)
Hemoglobin: 14.8 g/dL (ref 13.0–17.7)
Immature Grans (Abs): 0.1 10*3/uL (ref 0.0–0.1)
Immature Granulocytes: 1 %
Lymphocytes Absolute: 2.9 10*3/uL (ref 0.7–3.1)
Lymphs: 32 %
MCH: 28.8 pg (ref 26.6–33.0)
MCHC: 33 g/dL (ref 31.5–35.7)
MCV: 88 fL (ref 79–97)
Monocytes Absolute: 0.6 10*3/uL (ref 0.1–0.9)
Monocytes: 7 %
Neutrophils Absolute: 5.1 10*3/uL (ref 1.4–7.0)
Neutrophils: 54 %
Platelets: 381 10*3/uL (ref 150–450)
RBC: 5.13 x10E6/uL (ref 4.14–5.80)
RDW: 14.1 % (ref 11.6–15.4)
WBC: 9.3 10*3/uL (ref 3.4–10.8)

## 2020-01-24 LAB — HEMOGLOBIN A1C
Est. average glucose Bld gHb Est-mCnc: 255 mg/dL
Hgb A1c MFr Bld: 10.5 % — ABNORMAL HIGH (ref 4.8–5.6)

## 2020-02-17 ENCOUNTER — Telehealth: Payer: Self-pay | Admitting: Family Medicine

## 2020-02-17 NOTE — Telephone Encounter (Signed)
Pt called back wanting to know if the application was ready for pick up so I advised pt of previous message and he states he already had the form he just misplaced it; pt states Dr.Hilts was just renewing it for him  [phone]

## 2020-02-17 NOTE — Telephone Encounter (Signed)
Patient called needing a Handicap Placard. Patient said he can not find the form and will need a copy of the form. Patient said he will pick up form. The number to contact patient is [phone]

## 2020-02-17 NOTE — Telephone Encounter (Signed)
Please advise 

## 2020-02-17 NOTE — Telephone Encounter (Signed)
Shouldn't need a handicap form.  Would need an ov if something has changed.

## 2020-02-18 ENCOUNTER — Telehealth: Payer: Self-pay

## 2020-02-18 ENCOUNTER — Ambulatory Visit: Payer: Medicaid Other | Admitting: Pharmacist

## 2020-02-18 NOTE — Telephone Encounter (Signed)
Pt called back wanting to know if the application was ready for pick up so I advised pt of previous message and he states he already had the form he just misplaced it; pt states Dr.Hilts was just renewing it for him  [phone] 

## 2020-02-18 NOTE — Telephone Encounter (Signed)
I called the patient - coming in 02/20/20 to see [name] for his persistent low back pain.

## 2020-02-18 NOTE — Telephone Encounter (Signed)
I called and advised the patient that he would need an office visit for his persistent back pain prior to any renewal of the handicap placard application --- the one he was given earlier in the year was temporary only. Coming in at 9:20 on Thursday, 11/04 with [name].

## 2020-02-20 ENCOUNTER — Ambulatory Visit (INDEPENDENT_AMBULATORY_CARE_PROVIDER_SITE_OTHER): Payer: Medicaid Other | Admitting: Family Medicine

## 2020-02-20 ENCOUNTER — Encounter: Payer: Self-pay | Admitting: Family Medicine

## 2020-02-20 ENCOUNTER — Other Ambulatory Visit: Payer: Self-pay

## 2020-02-20 DIAGNOSIS — G8929 Other chronic pain: Secondary | ICD-10-CM | POA: Diagnosis not present

## 2020-02-20 DIAGNOSIS — M5440 Lumbago with sciatica, unspecified side: Secondary | ICD-10-CM
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
## 2020-03-10 ENCOUNTER — Ambulatory Visit
Admission: RE | Admit: 2020-03-10 | Discharge: 2020-03-10 | Disposition: A | Discharge status: Home / Self Care | Payer: Medicaid Other | Source: Ambulatory Visit | Attending: Family Medicine | Admitting: Family Medicine

## 2020-03-10 ENCOUNTER — Other Ambulatory Visit: Payer: Self-pay

## 2020-03-10 ENCOUNTER — Telehealth: Payer: Self-pay | Admitting: Family Medicine

## 2020-03-10 DIAGNOSIS — M48061 Spinal stenosis, lumbar region without neurogenic claudication: Secondary | ICD-10-CM | POA: Diagnosis not present

## 2020-03-10 DIAGNOSIS — M5440 Lumbago with sciatica, unspecified side: Secondary | ICD-10-CM

## 2020-03-10 DIAGNOSIS — G8929 Other chronic pain: Secondary | ICD-10-CM

## 2020-03-10 DIAGNOSIS — M545 Low back pain, unspecified: Secondary | ICD-10-CM | POA: Diagnosis not present

## 2020-03-10 IMAGING — MR MR LUMBAR SPINE W/O CM
4 of 5 series · 18 of 48 positions shown · non-contrast
Comparison: Radiography [DATE]

CLINICAL DATA: Low back pain over the last 6 weeks radiating to the
buttocks and both hips, right worse than left.

EXAM:
MRI LUMBAR SPINE WITHOUT CONTRAST
TECHNIQUE: Multiplanar, multisequence MR imaging of the lumbar spine was
performed. No intravenous contrast was administered.

[Series 5: T2 · sagittal · 4.0mm · 0.73mm/px · 6 of 15 slices shown (1 of 2)]
[im 1/15]
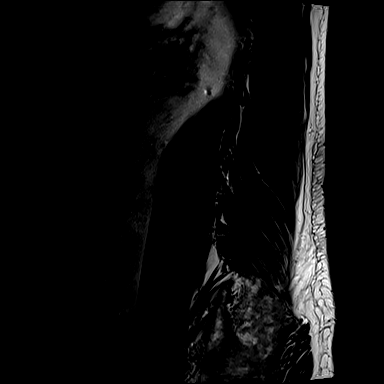
[im 3/15]
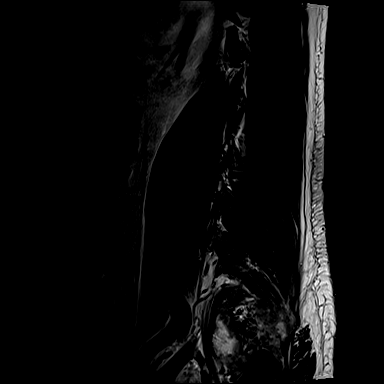
[im 6/15]
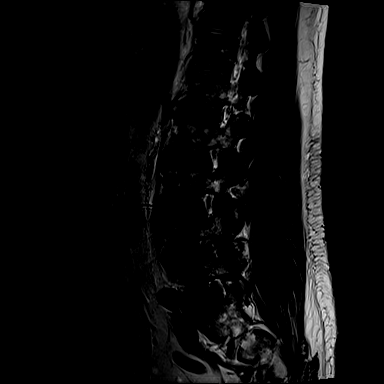
[im 9/15]
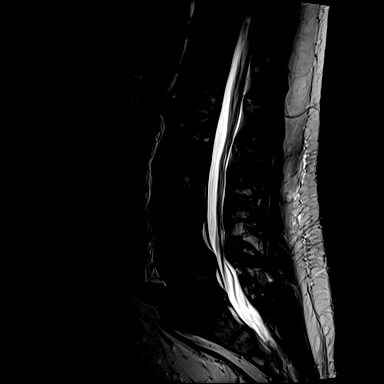
[im 12/15]
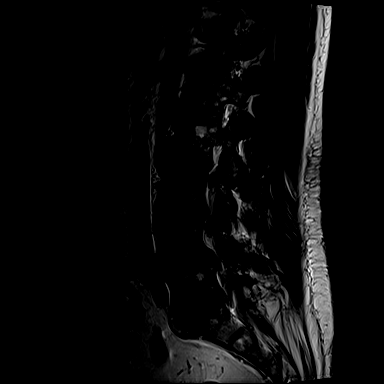
[im 15/15]
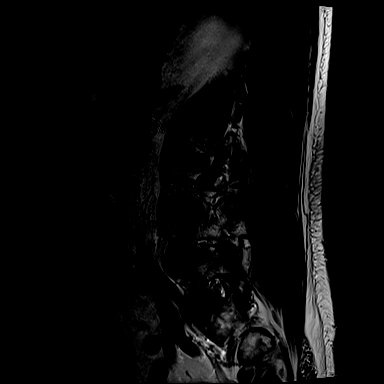

[Series 6: T1 · sagittal · 4.0mm · 0.73mm/px · 3 of 15 slices shown (1 of 2)]
[im 1/15]
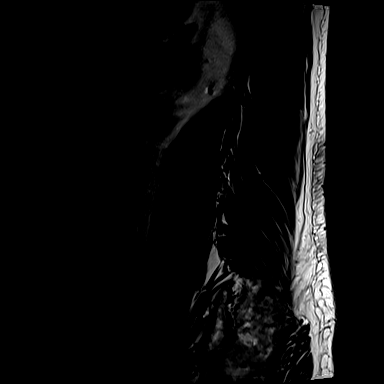
[im 8/15]
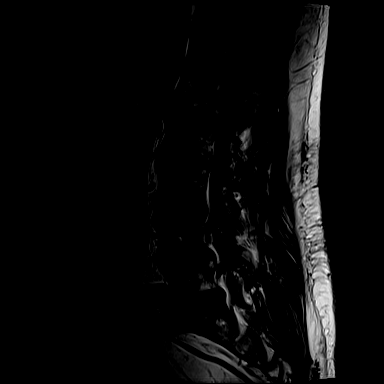
[im 15/15]
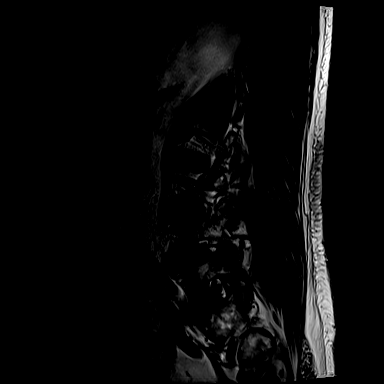

[Series 10: T1 · axial · 4.0mm · 0.28mm/px · z∈[-78,+93]mm · 3 of 43 slices shown (2 of 2)]
[im 6/43]
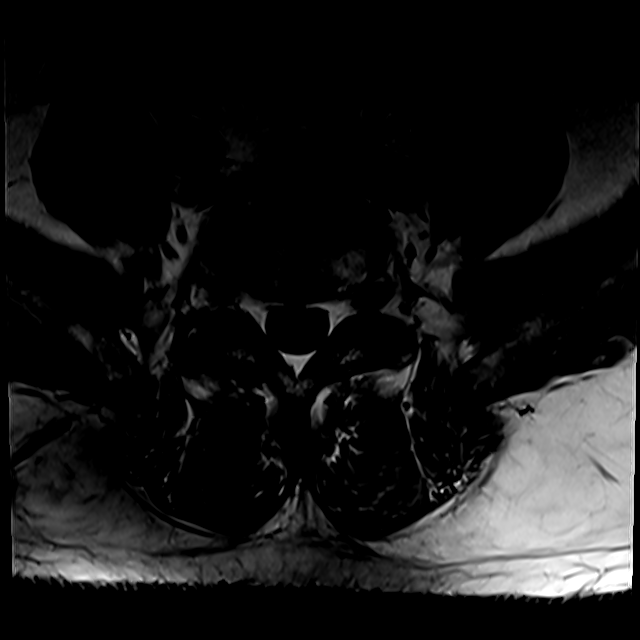
[im 23/43]
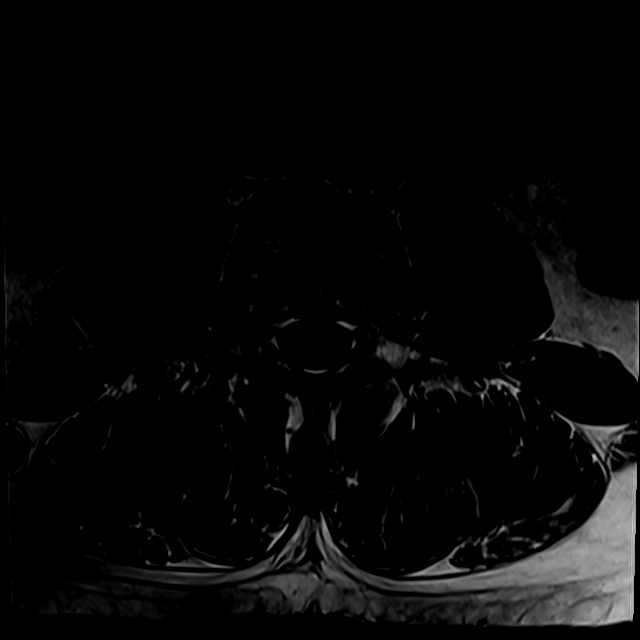
[im 37/43]
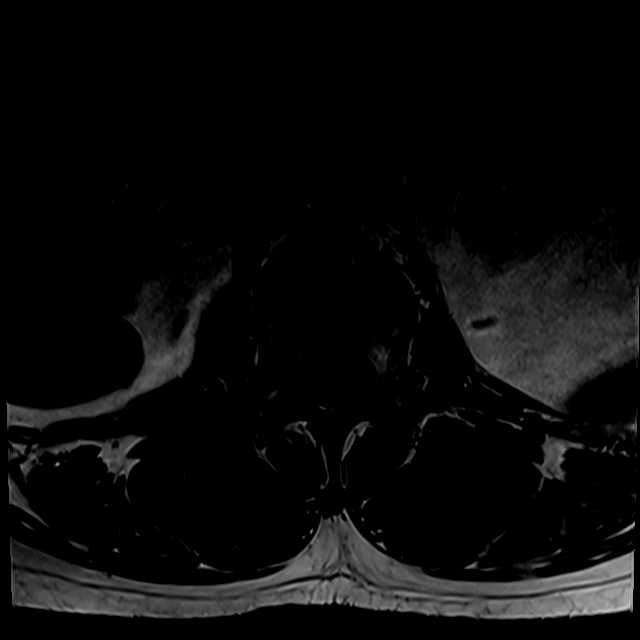

[Series 13: T2 · axial · 4.0mm · 0.28mm/px · z∈[-93,+93]mm · 6 of 43 slices shown (2 of 2)]
[im 3/43]
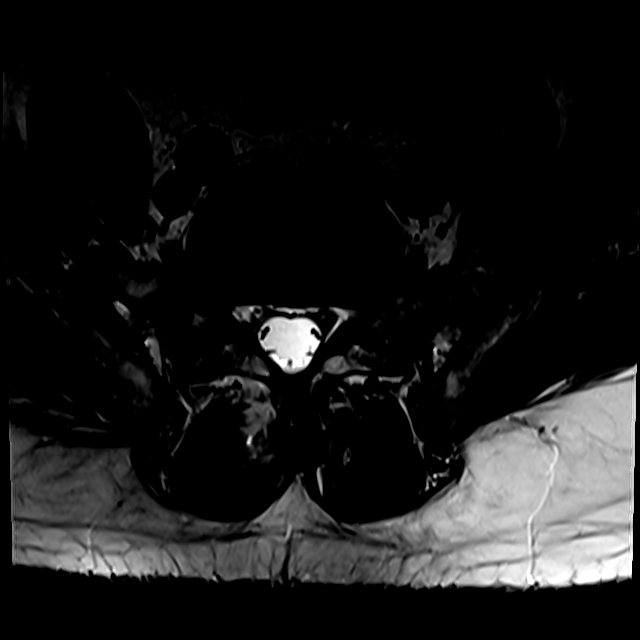
[im 6/43]
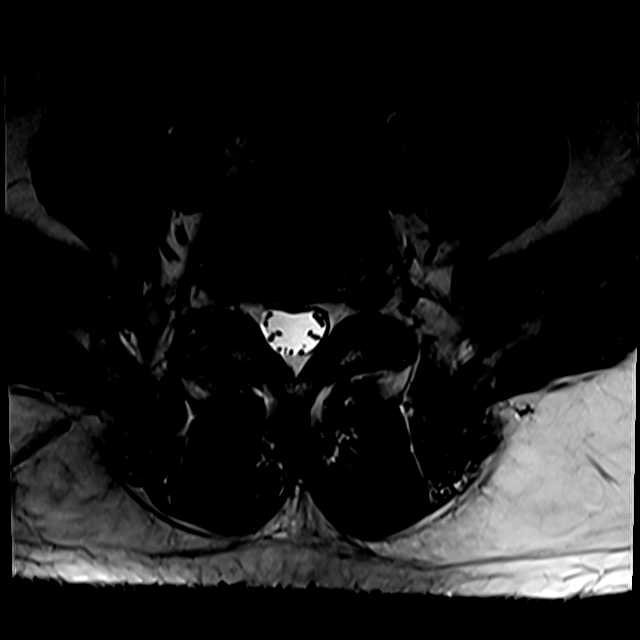
[im 9/43]
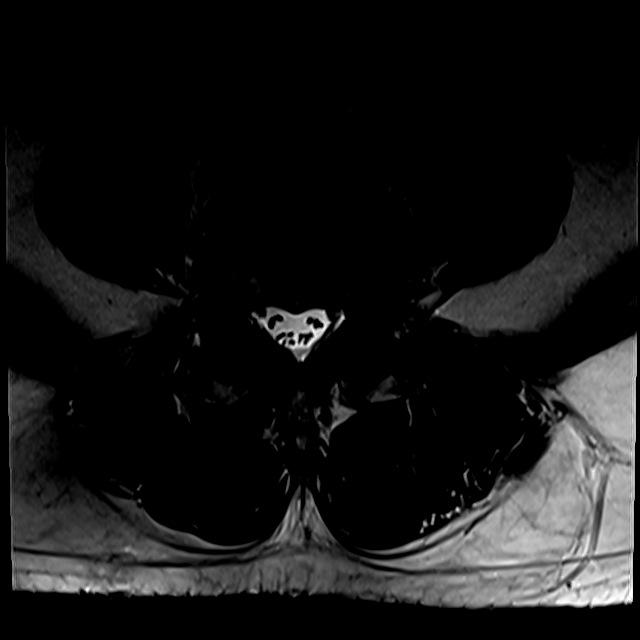
[im 15/43]
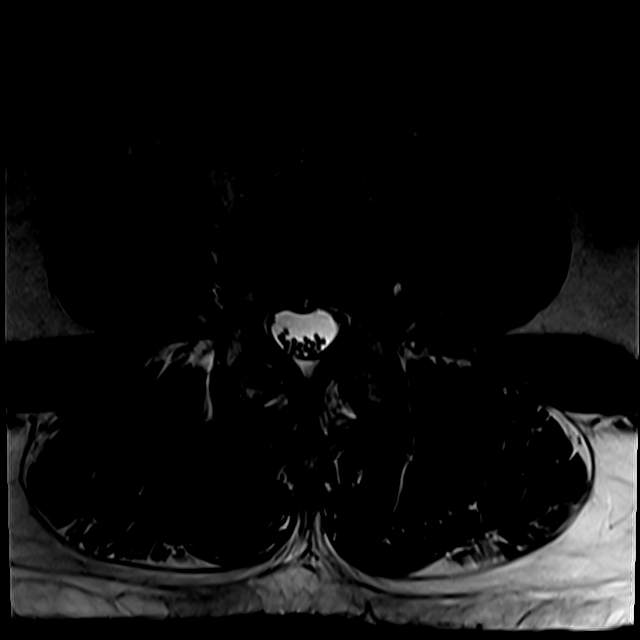
[im 23/43]
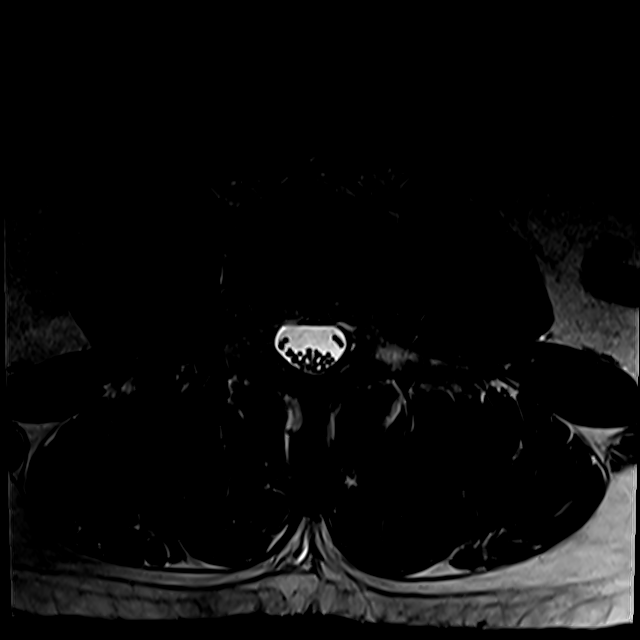
[im 37/43]
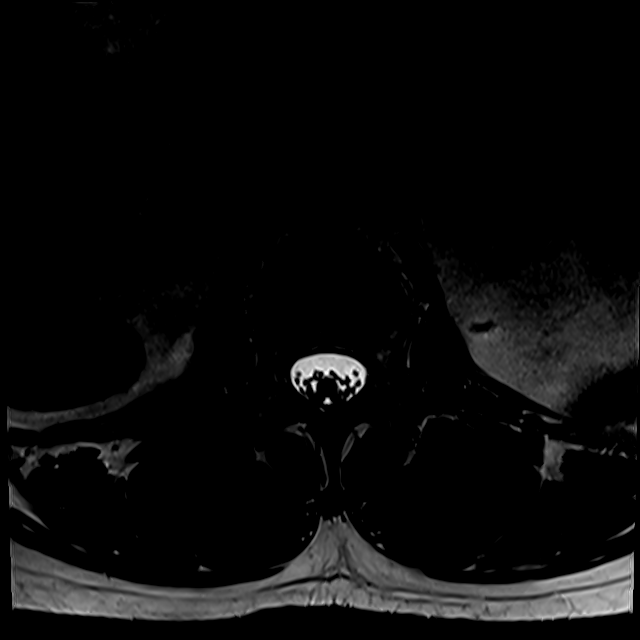

[18 of 48 positions shown; findings below may reference images not displayed]

FINDINGS: Segmentation:  5 lumbar type vertebral bodies.

Alignment:  4 mm degenerative anterolisthesis L4-5.

Vertebrae:  No fracture or primary bone lesion.

Conus medullaris and cauda equina: Conus extends to the L1 level.
Conus and cauda equina appear normal.

Paraspinal and other soft tissues: Absent left kidney.

Disc levels:

No significant finding at T11-12, T12-L1 or L1-2.

L2-3: No disc pathology. Facet osteoarthritis worse on the left,
with mild edema on the left. No encroachment upon the neural
structures. Findings could relate to back pain.

L3-4: No disc abnormality. Facet osteoarthritis slightly more
pronounced on the left. No encroachment upon the neural structures.
Findings could relate to back pain.

L4-5: Chronic bilateral facet arthropathy with 4 mm of
anterolisthesis. Desiccation and bulging of the disc, more prominent
in the foraminal regions left more than right. Mild central canal
stenosis without visible neural compression in that location. Some
potential that either exiting L4 nerve could be affected by the
foraminal stenosis. The facet arthritis shows some edematous change,
and this could be a cause of back pain or referred facet syndrome
pain.

L5-S1: Bulging of the disc slightly more prominent towards the left.
No canal stenosis. Mild bilateral foraminal narrowing, left more
than right, but without definite compression of the exiting L5
nerves.
IMPRESSION: 1. L2-3 and L3-4: Facet osteoarthritis worse on the left than the
right, which could relate to back pain or referred facet syndrome
pain. No encroachment upon the neural structures.
2. L4-5: Bilateral facet arthropathy with 4 mm of anterolisthesis.
Bulging of the disc, more prominent in the foraminal regions left
more than right. Mild central canal stenosis without visible neural
compression in that location. Some potential that either exiting L4
nerve could be affected by the foraminal stenosis. The facet
arthritis shows some edematous change, and this could be a cause of
back pain or referred facet syndrome pain.
3. L5-S1: Disc bulge more prominent towards the left. Mild bilateral
foraminal narrowing, left more than right, but without definite
compression of the exiting L5 nerves.

## 2020-03-10 NOTE — Telephone Encounter (Signed)
MRI scan shows arthritis at the facet joints of L4-5 resulting in shifting of L4 relative to L5, resulting in narrowing of the nerve openings on both sides. There is disc bulging at L5-S1 more on the left than the right, but it does not compress nerves. There is arthritis at other levels as well.  No clear-cut indication for surgery.  Could contemplate referral for epidural steroid injections if you would like.  Chiropractic would be another consideration.

## 2020-03-11 ENCOUNTER — Telehealth: Payer: Self-pay

## 2020-03-11 ENCOUNTER — Ambulatory Visit (INDEPENDENT_AMBULATORY_CARE_PROVIDER_SITE_OTHER): Payer: Worker's Compensation | Admitting: Family Medicine

## 2020-03-11 DIAGNOSIS — M545 Low back pain, unspecified: Secondary | ICD-10-CM

## 2020-03-11 DIAGNOSIS — S39012A Strain of muscle, fascia and tendon of lower back, initial encounter: Secondary | ICD-10-CM | POA: Diagnosis not present

## 2020-03-11 DIAGNOSIS — M79632 Pain in left forearm: Secondary | ICD-10-CM | POA: Diagnosis not present

## 2020-03-11 DIAGNOSIS — M25512 Pain in left shoulder: Secondary | ICD-10-CM | POA: Diagnosis not present

## 2020-03-11 DIAGNOSIS — S62633A Displaced fracture of distal phalanx of left middle finger, initial encounter for closed fracture: Secondary | ICD-10-CM | POA: Diagnosis not present

## 2020-03-11 DIAGNOSIS — S01111A Laceration without foreign body of right eyelid and periocular area, initial encounter: Secondary | ICD-10-CM | POA: Diagnosis not present

## 2020-03-11 DIAGNOSIS — S0990XA Unspecified injury of head, initial encounter: Secondary | ICD-10-CM | POA: Diagnosis not present

## 2020-03-11 DIAGNOSIS — M5459 Other low back pain: Secondary | ICD-10-CM | POA: Diagnosis not present

## 2020-03-11 DIAGNOSIS — M25562 Pain in left knee: Secondary | ICD-10-CM

## 2020-03-11 DIAGNOSIS — M25552 Pain in left hip: Secondary | ICD-10-CM | POA: Diagnosis not present

## 2020-03-11 MED ORDER — HYDROCODONE-ACETAMINOPHEN 10-325 MG PO TABS
1.0000 | ORAL_TABLET | Freq: Three times a day (TID) | ORAL | 0 refills | Status: DC | PRN
Start: 2020-03-11 — End: 2020-04-01

## 2020-03-11 MED ORDER — CHLORZOXAZONE 500 MG PO TABS: 500.0000 mg | ORAL_TABLET | Freq: Three times a day (TID) | ORAL | 1 refills | Status: DC | PRN

## 2020-03-11 NOTE — Telephone Encounter (Signed)
Ok to approve 

## 2020-03-11 NOTE — Telephone Encounter (Signed)
Patient called he stated Dr.Hilts prescribed hydrocodone and he recently received oxycodone from the ER. He stated pharmacy stated they need Dr.Hilts to send in a waiver so patient can receive medication. CB:[phone]
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
## 2020-03-11 NOTE — Telephone Encounter (Signed)
I called [facility] [address] and spoke with Revonda Standard, one of the pharmacists. The patient needs to finish the oxycodone Rx before he can get the hydrocodone filled. The Rx he was given by the ER this morning (oxy) should last him at least 3 days per the pharmacist. [facility] will not cover both medications together.  I called the patient and advised him of this. He voiced understanding of taking the oxycodone Rx first, and then the hydrocodone once that is finished.

## 2020-03-14 ENCOUNTER — Other Ambulatory Visit: Payer: Self-pay

## 2020-03-14 ENCOUNTER — Ambulatory Visit
Admission: EM | Admit: 2020-03-14 | Discharge: 2020-03-14 | Disposition: A | Discharge status: Home / Self Care | Payer: Medicaid Other | Attending: Emergency Medicine | Admitting: Emergency Medicine

## 2020-03-14 ENCOUNTER — Encounter: Payer: Self-pay | Admitting: *Deleted

## 2020-03-14 ENCOUNTER — Ambulatory Visit (INDEPENDENT_AMBULATORY_CARE_PROVIDER_SITE_OTHER): Payer: Medicaid Other

## 2020-03-14 DIAGNOSIS — R1012 Left upper quadrant pain: Secondary | ICD-10-CM

## 2020-03-14 DIAGNOSIS — Z041 Encounter for examination and observation following transport accident: Secondary | ICD-10-CM | POA: Diagnosis not present

## 2020-03-14 DIAGNOSIS — R0782 Intercostal pain: Secondary | ICD-10-CM | POA: Diagnosis not present
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
## 2020-03-15 ENCOUNTER — Telehealth: Payer: Self-pay | Admitting: Emergency Medicine

## 2020-03-15 MED ORDER — POLYMYXIN B-TRIMETHOPRIM 10000-0.1 UNIT/ML-% OP SOLN: 1.0000 [drp] | OPHTHALMIC | 0 refills | Status: DC

## 2020-03-15 NOTE — Telephone Encounter (Signed)
polytrim sent

## 2020-03-16 ENCOUNTER — Telehealth: Payer: Self-pay | Admitting: Family Medicine
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
## 2020-03-16 NOTE — Telephone Encounter (Signed)
Patient called. He would like a note stating why he was out of work in April. His call back number is [phone]

## 2020-03-17 NOTE — Telephone Encounter (Signed)
I did not see him for that and cannot write a letter for that.  He should contact the urgent care and ask them for the letter.

## 2020-03-18 ENCOUNTER — Encounter: Payer: Self-pay | Admitting: Family Medicine

## 2020-03-18 ENCOUNTER — Ambulatory Visit (INDEPENDENT_AMBULATORY_CARE_PROVIDER_SITE_OTHER): Payer: Worker's Compensation | Admitting: Family Medicine

## 2020-03-18 ENCOUNTER — Ambulatory Visit (INDEPENDENT_AMBULATORY_CARE_PROVIDER_SITE_OTHER): Payer: Medicaid Other

## 2020-03-18 ENCOUNTER — Other Ambulatory Visit: Payer: Self-pay

## 2020-03-18 DIAGNOSIS — S62633D Displaced fracture of distal phalanx of left middle finger, subsequent encounter for fracture with routine healing: Secondary | ICD-10-CM

## 2020-03-18 DIAGNOSIS — M545 Low back pain, unspecified: Secondary | ICD-10-CM

## 2020-03-18 NOTE — Patient Instructions (Signed)
    [facility] [address] [place], [place] [address] Phone: [phone] Fax: [phone]
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
## 2020-03-18 NOTE — Telephone Encounter (Signed)
Notified the patient while in the office for his 1 week recheck with [name].

## 2020-03-28 ENCOUNTER — Other Ambulatory Visit: Payer: Self-pay

## 2020-03-28 ENCOUNTER — Ambulatory Visit: Payer: Worker's Compensation | Attending: Family Medicine

## 2020-03-28 DIAGNOSIS — R2689 Other abnormalities of gait and mobility: Secondary | ICD-10-CM | POA: Insufficient documentation

## 2020-03-28 DIAGNOSIS — M6281 Muscle weakness (generalized): Secondary | ICD-10-CM | POA: Insufficient documentation

## 2020-03-28 DIAGNOSIS — M25512 Pain in left shoulder: Secondary | ICD-10-CM | POA: Insufficient documentation

## 2020-03-28 DIAGNOSIS — M25612 Stiffness of left shoulder, not elsewhere classified: Secondary | ICD-10-CM | POA: Diagnosis present

## 2020-03-28 DIAGNOSIS — M5440 Lumbago with sciatica, unspecified side: Secondary | ICD-10-CM | POA: Diagnosis present

## 2020-03-28 DIAGNOSIS — M25562 Pain in left knee: Secondary | ICD-10-CM | POA: Insufficient documentation

## 2020-03-29 ENCOUNTER — Emergency Department (HOSPITAL_COMMUNITY)
Admission: EM | Admit: 2020-03-29 | Discharge: 2020-03-29 | Disposition: A | Discharge status: Home / Self Care | Payer: Worker's Compensation | Attending: Emergency Medicine | Admitting: Emergency Medicine

## 2020-03-29 ENCOUNTER — Encounter (HOSPITAL_COMMUNITY): Payer: Self-pay | Admitting: Emergency Medicine

## 2020-03-29 ENCOUNTER — Emergency Department (HOSPITAL_COMMUNITY): Payer: Worker's Compensation

## 2020-03-29 ENCOUNTER — Other Ambulatory Visit: Payer: Self-pay

## 2020-03-29 DIAGNOSIS — K5732 Diverticulitis of large intestine without perforation or abscess without bleeding: Secondary | ICD-10-CM | POA: Insufficient documentation

## 2020-03-29 DIAGNOSIS — I129 Hypertensive chronic kidney disease with stage 1 through stage 4 chronic kidney disease, or unspecified chronic kidney disease: Secondary | ICD-10-CM | POA: Insufficient documentation

## 2020-03-29 DIAGNOSIS — R1032 Left lower quadrant pain: Secondary | ICD-10-CM | POA: Insufficient documentation

## 2020-03-29 DIAGNOSIS — N189 Chronic kidney disease, unspecified: Secondary | ICD-10-CM | POA: Diagnosis not present

## 2020-03-29 DIAGNOSIS — K5792 Diverticulitis of intestine, part unspecified, without perforation or abscess without bleeding: Secondary | ICD-10-CM

## 2020-03-29 DIAGNOSIS — Z794 Long term (current) use of insulin: Secondary | ICD-10-CM | POA: Diagnosis not present

## 2020-03-29 DIAGNOSIS — F17210 Nicotine dependence, cigarettes, uncomplicated: Secondary | ICD-10-CM | POA: Diagnosis not present

## 2020-03-29 DIAGNOSIS — E1122 Type 2 diabetes mellitus with diabetic chronic kidney disease: Secondary | ICD-10-CM | POA: Diagnosis not present

## 2020-03-29 DIAGNOSIS — Z905 Acquired absence of kidney: Secondary | ICD-10-CM | POA: Diagnosis not present

## 2020-03-29 DIAGNOSIS — Y92410 Unspecified street and highway as the place of occurrence of the external cause: Secondary | ICD-10-CM | POA: Insufficient documentation

## 2020-03-29 DIAGNOSIS — K429 Umbilical hernia without obstruction or gangrene: Secondary | ICD-10-CM | POA: Diagnosis not present

## 2020-03-29 DIAGNOSIS — Z79899 Other long term (current) drug therapy: Secondary | ICD-10-CM | POA: Diagnosis not present

## 2020-03-29 DIAGNOSIS — Z7984 Long term (current) use of oral hypoglycemic drugs: Secondary | ICD-10-CM | POA: Diagnosis not present

## 2020-03-29 LAB — CBC
HCT: 40.3 % (ref 39.0–52.0)
Hemoglobin: 13.3 g/dL (ref 13.0–17.0)
MCH: 29.4 pg (ref 26.0–34.0)
MCHC: 33 g/dL (ref 30.0–36.0)
MCV: 89 fL (ref 80.0–100.0)
Platelets: 468 10*3/uL — ABNORMAL HIGH (ref 150–400)
RBC: 4.53 MIL/uL (ref 4.22–5.81)
RDW: 14.3 % (ref 11.5–15.5)
WBC: 15.3 10*3/uL — ABNORMAL HIGH (ref 4.0–10.5)
nRBC: 0 % (ref 0.0–0.2)

## 2020-03-29 LAB — URINALYSIS, ROUTINE W REFLEX MICROSCOPIC
Bilirubin Urine: NEGATIVE
Glucose, UA: NEGATIVE mg/dL
Hgb urine dipstick: NEGATIVE
Ketones, ur: NEGATIVE mg/dL
Leukocytes,Ua: NEGATIVE
Nitrite: NEGATIVE
Protein, ur: NEGATIVE mg/dL
Specific Gravity, Urine: 1.019 (ref 1.005–1.030)
pH: 5 (ref 5.0–8.0)

## 2020-03-29 LAB — COMPREHENSIVE METABOLIC PANEL
ALT: 36 U/L (ref 0–44)
AST: 22 U/L (ref 15–41)
Albumin: 3.5 g/dL (ref 3.5–5.0)
Alkaline Phosphatase: 87 U/L (ref 38–126)
Anion gap: 9 (ref 5–15)
BUN: 9 mg/dL (ref 8–23)
CO2: 26 mmol/L (ref 22–32)
Calcium: 9.3 mg/dL (ref 8.9–10.3)
Chloride: 103 mmol/L (ref 98–111)
Creatinine, Ser: 1.2 mg/dL (ref 0.61–1.24)
GFR, Estimated: >60 mL/min (ref >60)
Glucose, Bld: 240 mg/dL — ABNORMAL HIGH (ref 70–99)
Potassium: 3.8 mmol/L (ref 3.5–5.1)
Sodium: 138 mmol/L (ref 135–145)
Total Bilirubin: 0.5 mg/dL (ref 0.3–1.2)
Total Protein: 6.9 g/dL (ref 6.5–8.1)

## 2020-03-29 LAB — LIPASE, BLOOD: Lipase: 26 U/L (ref 11–51)

## 2020-03-29 LAB — LACTIC ACID, PLASMA: Lactic Acid, Venous: 1 mmol/L (ref 0.5–1.9)

## 2020-03-29 MED ORDER — SODIUM CHLORIDE 0.9 % IV BOLUS
1000.0000 mL | Freq: Once | INTRAVENOUS | Status: AC
  Administered 2020-03-29: 15:00:00 1000 mL via INTRAVENOUS

## 2020-03-29 MED ORDER — MORPHINE SULFATE (PF) 4 MG/ML IV SOLN
4.0000 mg | Freq: Once | INTRAVENOUS | Status: AC
  Administered 2020-03-29: 15:00:00 4 mg via INTRAVENOUS
  Filled 2020-03-29: qty 1

## 2020-03-29 MED ORDER — ONDANSETRON 4 MG PO TBDP: 4.0000 mg | ORAL_TABLET | Freq: Three times a day (TID) | ORAL | 0 refills | Status: DC | PRN

## 2020-03-29 MED ORDER — AMOXICILLIN-POT CLAVULANATE 875-125 MG PO TABS: 1.0000 | ORAL_TABLET | Freq: Two times a day (BID) | ORAL | 0 refills | Status: AC

## 2020-03-29 MED ORDER — IOHEXOL 300 MG/ML  SOLN
100.0000 mL | Freq: Once | INTRAMUSCULAR | Status: AC | PRN
  Administered 2020-03-29: 16:00:00 100 mL via INTRAVENOUS

## 2020-03-29 MED ORDER — ONDANSETRON HCL 4 MG/2ML IJ SOLN
4.0000 mg | Freq: Once | INTRAMUSCULAR | Status: AC
  Administered 2020-03-29: 15:00:00 4 mg via INTRAVENOUS
  Filled 2020-03-29: qty 2
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
## 2020-03-29 NOTE — Patient Instructions (Signed)
  Circles as well in each direction
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
## 2020-03-29 NOTE — Discharge Instructions (Signed)
Please follow-up with your primary care doctor. You have a condition called diverticulitis. Please read the attached information. I have prescribed you an antibiotic called Augmentin which she will take for the next 14 days. Please drink plenty of water please take your antibiotic with food. I have also prescribed you Zofran which is a nausea medication.

## 2020-04-01 ENCOUNTER — Ambulatory Visit: Payer: Self-pay

## 2020-04-01 ENCOUNTER — Other Ambulatory Visit: Payer: Self-pay

## 2020-04-01 ENCOUNTER — Ambulatory Visit (INDEPENDENT_AMBULATORY_CARE_PROVIDER_SITE_OTHER): Payer: Worker's Compensation | Admitting: Family Medicine

## 2020-04-01 DIAGNOSIS — S62633D Displaced fracture of distal phalanx of left middle finger, subsequent encounter for fracture with routine healing: Secondary | ICD-10-CM

## 2020-04-01 DIAGNOSIS — M25512 Pain in left shoulder: Secondary | ICD-10-CM | POA: Diagnosis not present

## 2020-04-01 MED ORDER — HYDROCODONE-ACETAMINOPHEN 10-325 MG PO TABS: 0.5000 | ORAL_TABLET | Freq: Two times a day (BID) | ORAL | 0 refills | Status: DC | PRN
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
## 2020-04-02 ENCOUNTER — Telehealth: Payer: Self-pay | Admitting: Family Medicine

## 2020-04-02 NOTE — Telephone Encounter (Signed)
Angie with WC called asking we fax the appt notes from the 04/01/20 visit please  Fax# [phone]

## 2020-04-02 NOTE — Telephone Encounter (Signed)
Sending to you since this is WC.

## 2020-04-03 ENCOUNTER — Encounter: Payer: Self-pay | Admitting: Family Medicine

## 2020-04-03 ENCOUNTER — Telehealth: Payer: Self-pay | Admitting: Family Medicine

## 2020-04-03 NOTE — Telephone Encounter (Signed)
Please advise 

## 2020-04-03 NOTE — Telephone Encounter (Signed)
Pt called stating he needs his out work status extended another 8 weeks until his PT is over; he would like the note left at the front desk and a call to let him know it's ready.  [phone]

## 2020-04-03 NOTE — Telephone Encounter (Signed)
I called and advised the patient it's ready for pickup.

## 2020-04-03 NOTE — Telephone Encounter (Signed)
Printed

## 2020-04-16 ENCOUNTER — Other Ambulatory Visit: Payer: Self-pay

## 2020-04-16 ENCOUNTER — Ambulatory Visit: Payer: Worker's Compensation

## 2020-04-16 DIAGNOSIS — M25562 Pain in left knee: Secondary | ICD-10-CM

## 2020-04-16 DIAGNOSIS — M5440 Lumbago with sciatica, unspecified side: Secondary | ICD-10-CM

## 2020-04-16 DIAGNOSIS — M25512 Pain in left shoulder: Secondary | ICD-10-CM | POA: Diagnosis not present

## 2020-04-16 DIAGNOSIS — M25612 Stiffness of left shoulder, not elsewhere classified: Secondary | ICD-10-CM

## 2020-04-16 DIAGNOSIS — R2689 Other abnormalities of gait and mobility: Secondary | ICD-10-CM

## 2020-04-16 DIAGNOSIS — M6281 Muscle weakness (generalized): Secondary | ICD-10-CM

## 2020-04-17 NOTE — Therapy (Signed)
[facility] [address] [place], [place], [address] Phone: [phone]   Fax:  [phone]  Physical Therapy Treatment  Patient Details  Name: Curtis Williamson MRN: [id number] Date of Birth: Nov 22, 1960 Referring Provider (PT): Lavada Mesi, MD   Encounter Date: 04/16/2020   PT End of Session - 04/17/20 2238    Visit Number 2    Number of Visits 17    Date for PT Re-Evaluation 05/30/20    Authorization Type MED PAY ASSURANCE; [place] MEDICAID [employer].    PT Start Time 1222    PT Stop Time 1313    PT Time Calculation (min) 51 min    Activity Tolerance Patient tolerated treatment well    Behavior During Therapy WFL for tasks assessed/performed           Past Medical History:  Diagnosis Date  . Chronic kidney disease    only has one kidney  . Diabetes mellitus without complication (HCC)   . Essential hypertension   . Hyperlipidemia   . Hypertension    Phreesia 01/13/2020    Past Surgical History:  Procedure Laterality Date  . KIDNEY DONATION    . MANDIBLE FRACTURE SURGERY    . ROTATOR CUFF REPAIR Right     There were no vitals filed for this visit.   Subjective Assessment - 04/17/20 2232    Subjective Pt reports his L shoulder has been doing better with movement from the HEP, but he is still having pain significant pain. "Something is still not right with my L shouder". MRI is scheduled for1/9/22.    Pertinent History Fx of the L 3rd ray    Limitations Lifting;House hold activities    Patient Stated Goals To improve the use of his L shoulder to being similar to his R. To return to driving a truck.    Currently in Pain? Yes    Pain Score 7     Pain Location Shoulder    Pain Orientation Left    Pain Descriptors / Indicators Stabbing    Pain Type Acute pain    Pain Onset 1 to 4 weeks ago    Pain Frequency Constant    Aggravating Factors  With use, sleeping    Pain Relieving Factors rest,  meds, biofreeze, ice packs, heating pad    Effect of Pain on Daily Activities Significant impact              [facility] PT Assessment - 04/17/20 0001      AROM   Left Shoulder Flexion 90 Degrees      Palpation   Palpation comment TTP ot the anterior shoulder area.      Special Tests   Other special tests Resisted shoulder flexion, ER, abd provoked pt's l anterior shoulder pain. With empty and full can tests, pt experienced pain, but no significant weakness. Resisted biceps, sh IR, and sh add did not provoke pt's pain.                         [facility] Adult PT Treatment/Exercise - 04/17/20 0001      Exercises   Exercises Shoulder      Shoulder Exercises: Standing   Flexion AROM;15 reps   ROM as tolerated   Flexion Limitations On table top    Extension Both;15 reps    Theraband Level (Shoulder Extension) Level 2 (Red)    Row Both;15 reps    Theraband Level (Shoulder Row) Level 2 (  Red)    Other Standing Exercises On table top, shpulder cirecles each direction 15x c ROm as tolerated      Modalities   Modalities Iontophoresis      Iontophoresis   Type of Iontophoresis Dexamethasone    Location l anterior shoulder    Dose 1 ml; 4mg /ml    Time 6 hours                  PT Education - 04/17/20 2235    Education Details HEP for posterior scapular stengthening exs. Pt education to watch his blood sugar level ofr possible elevation with Intophoresis. Pt reports having steriod injections in the past without issue.    Person(s) Educated Patient    Methods Explanation;Demonstration;Tactile cues;Verbal cues;Handout    Comprehension Verbalized understanding;Returned demonstration;Verbal cues required;Tactile cues required;Need further instruction            PT Short Term Goals - 03/29/20 2255      PT SHORT TERM GOAL #1   Title Pt will be Ind in an initial HEP    Status New    Target Date 04/19/20      PT SHORT TERM GOAL #2   Title Pt will voice  understanding of messures to assist in pain reduction    Status New    Target Date 04/19/20             PT Long Term Goals - 03/29/20 2320      PT LONG TERM GOAL #1   Title Pt will be Ind in a final HEP to maintain or progress achieved LOF    Status New    Target Date 05/30/20      PT LONG TERM GOAL #2   Title Increase L shoulder strength  to 4+-5/5 for improved 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    Baseline See flowsheets    Status New    Target Date 05/30/20      PT LONG TERM GOAL #4   Title Pt's L shoulder will decreased to less than 3/10 c household activites and drivng a truck    Baseline 6-9/10    Status New    Target Date 05/30/20                 Plan - 04/17/20 2243    Clinical Impression Statement Pt is still experiencing L shoulder pain that presents acute in nature, but to less of degree. SInce the eval the pt's L shoulder ROM has improved from 30 to 90d. Pt 's pain is more localized to the anterior shoulder. Ther exs were added to the HEP for posterior scapular strengthening. Intophoresis was applied to the anterior L shoulder for pain reduction.    Personal Factors and Comorbidities Comorbidity 1;Profession    Comorbidities DMll    Examination-Activity Limitations Carry;Lift;Reach Overhead    Examination-Participation Restrictions Occupation    Stability/Clinical Decision Making Evolving/Moderate complexity    Clinical Decision Making Moderate    Rehab Potential Good    PT Frequency 2x / week    PT Duration 8 weeks    PT Treatment/Interventions ADLs/Self Care Home Management;Cryotherapy;Electrical Stimulation;Ultrasound;Traction;Moist Heat;Iontophoresis 4mg /ml Dexamethasone;Functional mobility  training;Therapeutic activities;Therapeutic  exercise;Balance training;Manual techniques;Patient/family education;Passive range of motion;Dry needling;Taping;Vasopneumatic Device;Joint Manipulations    PT Next Visit Plan Assess respnse to HEP. and progress ther ex and use modalities as indicated. Eval low back and L knee as indicated.    PT Home Exercise Plan [id number]    Consulted and Agree with Plan of Care Patient           Patient will benefit from skilled therapeutic intervention in order to improve the following deficits and impairments:  Decreased strength,Pain,Impaired UE functional use,Decreased range of motion  Visit Diagnosis: Acute pain of left shoulder  Acute bilateral low back pain with sciatica, sciatica laterality unspecified  Acute pain of left knee  Stiffness of left shoulder, not elsewhere classified  Muscle weakness (generalized)  Other abnormalities of gait and mobility     Problem List Patient Active Problem List   Diagnosis Date Noted  . Tobacco dependence 03/26/2019  . Essential hypertension 03/26/2019  . Type 2 diabetes mellitus without complication, without long-term current use of insulin (HCC) 03/26/2019  . Aneurysm, cerebral, nonruptured 03/26/2019    Joellyn Rued MS, PT 04/17/20 11:02 PM  [facility] [address] [place], [place], [address] Phone: [phone]   Fax:  [phone]  Name: Curtis Williamson MRN: [id number] Date of Birth: 1960-11-21

## 2020-04-20 ENCOUNTER — Other Ambulatory Visit: Payer: Self-pay

## 2020-04-20 ENCOUNTER — Ambulatory Visit: Payer: Worker's Compensation | Attending: Family Medicine | Admitting: Physical Therapy

## 2020-04-20 DIAGNOSIS — M25562 Pain in left knee: Secondary | ICD-10-CM | POA: Insufficient documentation

## 2020-04-20 DIAGNOSIS — R2689 Other abnormalities of gait and mobility: Secondary | ICD-10-CM | POA: Insufficient documentation

## 2020-04-20 DIAGNOSIS — M25512 Pain in left shoulder: Secondary | ICD-10-CM | POA: Insufficient documentation

## 2020-04-20 DIAGNOSIS — M25612 Stiffness of left shoulder, not elsewhere classified: Secondary | ICD-10-CM | POA: Diagnosis present

## 2020-04-20 DIAGNOSIS — M6281 Muscle weakness (generalized): Secondary | ICD-10-CM | POA: Diagnosis present

## 2020-04-20 DIAGNOSIS — G8929 Other chronic pain: Secondary | ICD-10-CM | POA: Insufficient documentation

## 2020-04-20 DIAGNOSIS — M5440 Lumbago with sciatica, unspecified side: Secondary | ICD-10-CM | POA: Diagnosis present

## 2020-04-20 DIAGNOSIS — M545 Low back pain, unspecified: Secondary | ICD-10-CM | POA: Diagnosis present
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
## 2020-04-21 ENCOUNTER — Ambulatory Visit (INDEPENDENT_AMBULATORY_CARE_PROVIDER_SITE_OTHER): Payer: Worker's Compensation | Admitting: Family Medicine

## 2020-04-21 ENCOUNTER — Ambulatory Visit (INDEPENDENT_AMBULATORY_CARE_PROVIDER_SITE_OTHER): Payer: Worker's Compensation

## 2020-04-21 DIAGNOSIS — M25512 Pain in left shoulder: Secondary | ICD-10-CM | POA: Diagnosis not present

## 2020-04-21 DIAGNOSIS — S62633D Displaced fracture of distal phalanx of left middle finger, subsequent encounter for fracture with routine healing: Secondary | ICD-10-CM | POA: Diagnosis not present

## 2020-04-21 DIAGNOSIS — M25562 Pain in left knee: Secondary | ICD-10-CM | POA: Diagnosis not present

## 2020-04-21 DIAGNOSIS — M545 Low back pain, unspecified: Secondary | ICD-10-CM

## 2020-04-21 MED ORDER — HYDROCODONE-ACETAMINOPHEN 10-325 MG PO TABS: 0.5000 | ORAL_TABLET | Freq: Two times a day (BID) | ORAL | 0 refills | Status: DC | PRN
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
## 2020-04-22 ENCOUNTER — Other Ambulatory Visit: Payer: Self-pay

## 2020-04-22 ENCOUNTER — Ambulatory Visit: Payer: Worker's Compensation

## 2020-04-22 DIAGNOSIS — M6281 Muscle weakness (generalized): Secondary | ICD-10-CM

## 2020-04-22 DIAGNOSIS — M25512 Pain in left shoulder: Secondary | ICD-10-CM | POA: Diagnosis not present

## 2020-04-22 DIAGNOSIS — G8929 Other chronic pain: Secondary | ICD-10-CM

## 2020-04-22 DIAGNOSIS — M25612 Stiffness of left shoulder, not elsewhere classified: Secondary | ICD-10-CM

## 2020-04-22 DIAGNOSIS — M25562 Pain in left knee: Secondary | ICD-10-CM

## 2020-04-22 NOTE — Therapy (Signed)
[place], [place], [address] Phone: [phone]   Fax:  [phone]  Physical Therapy Treatment  Patient Details  Name: Curtis Williamson MRN: [id number] Date of Birth: March 18, 1961 Referring Provider (PT): Eunice Blase, MD   Encounter Date: 04/22/2020   PT End of Session - 04/22/20 1017    Visit Number 4    Number of Visits 17    Date for PT Re-Evaluation 05/30/20    Authorization Type MED PAY ASSURANCE; [place] MEDICAID [place].    PT Start Time 0920    PT Stop Time 1005    PT Time Calculation (min) 45 min    Activity Tolerance Patient tolerated treatment well    Behavior During Therapy WFL for tasks assessed/performed           Past Medical History:  Diagnosis Date  . Chronic kidney disease    only has one kidney  . Diabetes mellitus without complication ([place])   . Essential hypertension   . Hyperlipidemia   . Hypertension    Phreesia 01/13/2020    Past Surgical History:  Procedure Laterality Date  . KIDNEY DONATION    . MANDIBLE FRACTURE SURGERY    . ROTATOR CUFF REPAIR Right     There were no vitals filed for this visit.   Subjective Assessment - 04/22/20 0932    Subjective Pt reports gradual improvement, but is still having episodes of significant L shoulder and low back pain. No L knee pain. Pt states he has a chiropractor appt after today's PT session.    Pertinent History Fx of the L 3rd ray    Limitations Lifting;House hold activities    Patient Stated Goals To improve the use of his L shoulder to being similar to his R. To return to driving a truck.    Currently in Pain? Yes    Pain Score 6     Pain Location Shoulder    Pain Orientation Left    Pain Descriptors / Indicators Aching    Pain Type Acute pain    Pain Onset 1 to 4 weeks ago    Pain Frequency Constant    Multiple Pain Sites Yes    Pain Score 6    Pain Location Back    Pain Orientation  Right;Left;Posterior    Pain Descriptors / Indicators Throbbing;Heaviness;Tightness    Pain Type Acute pain    Pain Onset More than a month ago    Pain Frequency Constant    Aggravating Factors  Prolonged walking and standing    Pain Relieving Factors Pain meds    Effect of Pain on Daily Activities Significant                             [facility] Adult PT Treatment/Exercise - 04/22/20 0001      Exercises   Exercises Lumbar;Shoulder      Lumbar Exercises: Stretches   Active Hamstring Stretch Limitations L and R; 20 sec; 2x    Lower Trunk Rotation Limitations x15 5 sec hold; limited range    Piriformis Stretch Limitations 3x20 sec hold bilateral      Lumbar Exercises: Aerobic   Nustep 6 mins; L4; UEs/LEs      Shoulder Exercises: Pulleys   Flexion 3 minutes    Flexion Limitations as tolerated      Iontophoresis   Type of Iontophoresis Dexamethasone    Location L lateral GH jt, inf to  acromion    Dose 1 ml; 4mg /ml    Time 6 hours      Manual Therapy   Manual Therapy Soft tissue mobilization;Joint mobilization    Joint Mobilization Attempted graade 2 inf glides, no resistance felt, but pt reported increased pain and mob was Dced    Soft tissue mobilization Light STM to the [facility] and scapular areas to pt's tolerance. pt was tender to the [facility] jt/pectorial insertion, lateral GH jt, and upper trap                    PT Short Term Goals - 04/20/20 1646      PT SHORT TERM GOAL #1   Title Pt will be Ind in an initial HEP    Baseline 110 without pain    Period Weeks    Status On-going    Target Date 04/19/20      PT SHORT TERM GOAL #2   Title Pt will voice understanding of messures to assist in pain reduction    Baseline 5/5 gross    Time 3    Period Weeks    Status On-going    Target Date 04/19/20      PT SHORT TERM GOAL #3   Title Patient will increase pain free hip flexion to 100 degrees    Time 3    Period Weeks    Status New    Target  Date 05/11/20      PT SHORT TERM GOAL #4   Title Patient will increase gross left hip strength 4+/5    Time 3    Period Weeks    Status New    Target Date 05/11/20             PT Long Term Goals - 04/20/20 1656      PT LONG TERM GOAL #1   Title Pt will be Ind in a final HEP to maintain or progress achieved LOF    Baseline 110 without significant pain    Time 6    Period Weeks    Status On-going      PT LONG TERM GOAL #2   Title Increase L shoulder strength  to 4+-5/5 for improved 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    Period Weeks    Status On-going      PT LONG TERM GOAL #4   Title Pt's L shoulder will decreased to less than 3/10 c household activites and drivng a truck    Baseline 6-9/10    Period Weeks    Status On-going      PT LONG TERM GOAL #5   Title Patient will stand for 1 hour without self report of pain in order to perfrom further skilled therapy    Time 6    Period Weeks    Status New    Target Date 06/01/20      Additional Long Term Goals   Additional Long Term Goals Yes      PT LONG TERM GOAL #6   Title Patient will lie in bed without pain in his back and knee in order to sleep    Time 6  Period Weeks    Status New    Target Date 06/01/20                 Plan - 04/22/20 1018    Clinical Impression Statement PT was for L shoulder ROM and strengthening, L shoulder STM, and low back flexibility to improve mobility and reduce pain. IWth both AROM and PROM, pt's L shoulder flexion ROM is limited to 90d with pt experiencing sharp pain at that level. Pt reports with prior iontophoresis treatment, he found it beneficial with decreasing the pain. Ionto was provided to the L lateral GH jt inf to the acromion.     Personal Factors and Comorbidities Comorbidity 1;Profession    Comorbidities DMll    Examination-Activity Limitations Carry;Lift;Reach Overhead    Examination-Participation Restrictions Occupation    Stability/Clinical Decision Making Evolving/Moderate complexity    Clinical Decision Making Moderate    Rehab Potential Good    PT Frequency 2x / week    PT Duration 8 weeks    PT Treatment/Interventions ADLs/Self Care Home Management;Cryotherapy;Electrical Stimulation;Ultrasound;Traction;Moist Heat;Iontophoresis 4mg /ml Dexamethasone;Functional mobility training;Therapeutic activities;Therapeutic exercise;Balance training;Manual techniques;Patient/family education;Passive range of motion;Dry needling;Taping;Vasopneumatic Device;Joint Manipulations    PT Next Visit Plan Progress ther ex and use modalities as indicated to treatment of the L shoulder nad low back.    PT Home Exercise Plan [id number]    Consulted and Agree with Plan of Care Patient           Patient will benefit from skilled therapeutic intervention in order to improve the following deficits and impairments:  Decreased strength,Pain,Impaired UE functional use,Decreased range of motion  Visit Diagnosis: Acute pain of left shoulder  Acute pain of left knee  Stiffness of left shoulder, not elsewhere classified  Muscle weakness (generalized)  Chronic bilateral low back pain without sciatica     Problem List Patient Active Problem List   Diagnosis Date Noted  . Tobacco dependence 03/26/2019  . Essential hypertension 03/26/2019  . Type 2 diabetes mellitus without complication, without long-term current use of insulin (HCC) 03/26/2019  . Aneurysm, cerebral, nonruptured 03/26/2019    14/11/2018 MS, PT 04/22/20 10:49 AM  [facility] [address] [place], [place], [place] Phone: [phone]   Fax:  [phone]  Name: Yasin Ducat MRN: Margrett Rud Date of  Birth: [birth year]

## 2020-04-26 ENCOUNTER — Ambulatory Visit
Admission: RE | Admit: 2020-04-26 | Discharge: 2020-04-26 | Disposition: A | Discharge status: Home / Self Care | Payer: Worker's Compensation | Source: Ambulatory Visit | Attending: Family Medicine | Admitting: Family Medicine

## 2020-04-26 ENCOUNTER — Telehealth: Payer: Self-pay | Admitting: Family Medicine

## 2020-04-26 ENCOUNTER — Other Ambulatory Visit: Payer: Self-pay

## 2020-04-26 DIAGNOSIS — M25512 Pain in left shoulder: Secondary | ICD-10-CM
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
## 2020-04-26 NOTE — Telephone Encounter (Signed)
MRI confirms that the rotator cuff is torn in two places and pulled away from the bone.  It will need to be surgically repaired.  Please schedule consult with Roda Shutters to discuss.

## 2020-04-27 ENCOUNTER — Ambulatory Visit: Payer: Worker's Compensation | Attending: Family Medicine | Admitting: Physical Therapy

## 2020-04-27 ENCOUNTER — Encounter: Payer: Self-pay | Admitting: Physical Therapy

## 2020-04-27 DIAGNOSIS — M25512 Pain in left shoulder: Secondary | ICD-10-CM | POA: Diagnosis not present

## 2020-04-27 DIAGNOSIS — M6281 Muscle weakness (generalized): Secondary | ICD-10-CM | POA: Diagnosis present

## 2020-04-27 DIAGNOSIS — M25612 Stiffness of left shoulder, not elsewhere classified: Secondary | ICD-10-CM | POA: Diagnosis present

## 2020-04-27 DIAGNOSIS — M545 Low back pain, unspecified: Secondary | ICD-10-CM | POA: Insufficient documentation

## 2020-04-27 DIAGNOSIS — G8929 Other chronic pain: Secondary | ICD-10-CM

## 2020-04-27 DIAGNOSIS — M25562 Pain in left knee: Secondary | ICD-10-CM | POA: Diagnosis present

## 2020-04-27 NOTE — Therapy (Signed)
[facility] Outpatient Rehabilitation [facility] [address] [place], [place], [address] Phone: [phone]   Fax:  [phone]  Physical Therapy Treatment  Patient Details  Name: Curtis Williamson MRN: [id number] Date of Birth: 10-Aug-1960 Referring Provider (PT): Lavada Mesi, MD   Encounter Date: 04/27/2020   PT End of Session - 04/27/20 1125    Visit Number 5    Number of Visits 17    Date for PT Re-Evaluation 05/30/20    Authorization Type MED PAY ASSURANCE; [place] MEDICAID [employer].    PT Start Time 1100    PT Stop Time 1155    PT Time Calculation (min) 55 min    Activity Tolerance Patient tolerated treatment well    Behavior During Therapy WFL for tasks assessed/performed           Past Medical History:  Diagnosis Date  . Chronic kidney disease    only has one kidney  . Diabetes mellitus without complication (HCC)   . Essential hypertension   . Hyperlipidemia   . Hypertension    Phreesia 01/13/2020    Past Surgical History:  Procedure Laterality Date  . KIDNEY DONATION    . MANDIBLE FRACTURE SURGERY    . ROTATOR CUFF REPAIR Right     There were no vitals filed for this visit.   Subjective Assessment - 04/27/20 1109    Subjective Patient had an MRI of his shoulder. Per chart patient has 2 large full thickness RTC tears. He reports his shoulder has been hurting him significantly. We will hold on his shoulder at this time until he has a surgical follow up. He reports his back hurt after the last visit. He went to a chiropractor who did a few different tratments and her reports that helped his low back apin.    Pertinent History Fx of the L 3rd ray    Limitations Lifting;House hold activities    Patient Stated Goals To improve the use of his L shoulder to being similar to his R. To return to driving a truck.    Currently in Pain? Yes    Pain Score 10-Worst pain ever    Pain Location Shoulder    Pain Orientation Right    Pain  Descriptors / Indicators Aching    Pain Type Acute pain    Pain Onset 1 to 4 weeks ago    Pain Frequency Constant    Aggravating Factors  bending and moving    Pain Relieving Factors rest and medication    Effect of Pain on Daily Activities difficulty standing and walking    Pain Score 5    Pain Location Back    Pain Orientation Right;Left    Pain Descriptors / Indicators Aching    Pain Type Chronic pain    Pain Onset More than a month ago    Pain Frequency Constant    Aggravating Factors  7-8/10    Pain Relieving Factors pain meds    Effect of Pain on Daily Activities significant                             [facility] Adult PT Treatment/Exercise - 04/27/20 0001      Lumbar Exercises: Seated   Other Seated Lumbar Exercises attmpeted LAQbut had increased pain; clamshell 2x10 with abdominal breathing; ball squeezewith abdominal breathing 1x10 then had poain on the second set;      Lumbar Exercises: Supine   AB Set Limitations  reviewed abdominal breathing;    Clam Limitations supine hip abduction red 2x10      Manual Therapy   Manual Therapy Manual Traction    Soft tissue mobilization sof tissue mobilization    Manual Traction attempted LAD, but patient was unable to tolerate.                  PT Education - 04/27/20 1124    Education Details reviewed improtance of stretching and strengthening    Person(s) Educated Patient    Methods Explanation;Demonstration;Tactile cues;Verbal cues    Comprehension Verbalized understanding;Returned demonstration;Verbal cues required;Tactile cues required            PT Short Term Goals - 04/20/20 1646      PT SHORT TERM GOAL #1   Title Pt will be Ind in an initial HEP    Baseline 110 without pain    Period Weeks    Status On-going    Target Date 04/19/20      PT SHORT TERM GOAL #2   Title Pt will voice understanding of messures to assist in pain reduction    Baseline 5/5 gross    Time 3    Period Weeks     Status On-going    Target Date 04/19/20      PT SHORT TERM GOAL #3   Title Patient will increase pain free hip flexion to 100 degrees    Time 3    Period Weeks    Status New    Target Date 05/11/20      PT SHORT TERM GOAL #4   Title Patient will increase gross left hip strength 4+/5    Time 3    Period Weeks    Status New    Target Date 05/11/20             PT Long Term Goals - 04/20/20 1656      PT LONG TERM GOAL #1   Title Pt will be Ind in a final HEP to maintain or progress achieved LOF    Baseline 110 without significant pain    Time 6    Period Weeks    Status On-going      PT LONG TERM GOAL #2   Title Increase L shoulder strength  to 4+-5/5 for improved 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    Period Weeks    Status On-going      PT LONG TERM GOAL #4   Title Pt's L shoulder will decreased to less than 3/10 c household activites and drivng a truck    Baseline 6-9/10    Period Weeks    Status On-going      PT LONG TERM GOAL #5   Title Patient will stand for 1 hour without self report of pain in order to perfrom further skilled therapy    Time 6    Period Weeks    Status New    Target Date 06/01/20      Additional Long Term Goals   Additional Long Term Goals Yes      PT LONG TERM GOAL #6   Title Patient will lie in bed without pain in his back  and knee in order to sleep    Time 6    Period Weeks    Status New    Target Date 06/01/20                 Plan - 04/27/20 1125    Clinical Impression Statement Therapy held on shoulder 2nd to MRI. Therapy foucsed on his back. He had limited tolerance to manual therapy and limited tolerance to light stretching and ther-ex in his back. He reports ultrasound and e-stim helped  his pain for a few days after chriopractic car ebut the pain returned. He continues to have significant spasming in his back. He can continue towrok on ultrasound and e-stim at the chiropracter. We advised him we will continue to work on strengthneing and trigger point release here.    Personal Factors and Comorbidities Comorbidity 1;Profession    Comorbidities DMll    Examination-Activity Limitations Carry;Lift;Reach Overhead    Examination-Participation Restrictions Occupation    Stability/Clinical Decision Making Evolving/Moderate complexity    Clinical Decision Making Moderate    Rehab Potential Good    PT Frequency 2x / week    PT Duration 8 weeks    PT Treatment/Interventions ADLs/Self Care Home Management;Cryotherapy;Electrical Stimulation;Ultrasound;Traction;Moist Heat;Iontophoresis 4mg /ml Dexamethasone;Functional mobility training;Therapeutic activities;Therapeutic exercise;Balance training;Manual techniques;Patient/family education;Passive range of motion;Dry needling;Taping;Vasopneumatic Device;Joint Manipulations    PT Next Visit Plan focus on the back and hip. hold on the shoulder until ortho progressess.           Patient will benefit from skilled therapeutic intervention in order to improve the following deficits and impairments:  Decreased strength,Pain,Impaired UE functional use,Decreased range of motion  Visit Diagnosis: Acute pain of left shoulder  Acute pain of left knee  Stiffness of left shoulder, not elsewhere classified  Muscle weakness (generalized)  Chronic bilateral low back pain without sciatica     Problem List Patient Active Problem List   Diagnosis Date Noted  . Tobacco dependence 03/26/2019  . Essential hypertension 03/26/2019  . Type 2 diabetes mellitus without complication, without long-term current use of insulin (HCC) 03/26/2019  . Aneurysm, cerebral, nonruptured 03/26/2019    14/11/2018 04/27/2020, 2:35 PM  [facility] [address] [place], [place], [place] Phone: [phone]   Fax:  [phone]  Name: Curtis Williamson MRN: Margrett Rud Date of Birth: Aug 27, 1960

## 2020-04-27 NOTE — Telephone Encounter (Signed)
LVM for pt to call back to r/s appt with dr. Roda Shutters

## 2020-04-29 ENCOUNTER — Ambulatory Visit: Payer: Worker's Compensation

## 2020-04-29 ENCOUNTER — Other Ambulatory Visit: Payer: Self-pay

## 2020-04-29 DIAGNOSIS — M5440 Lumbago with sciatica, unspecified side: Secondary | ICD-10-CM

## 2020-04-29 DIAGNOSIS — M25512 Pain in left shoulder: Secondary | ICD-10-CM | POA: Diagnosis not present

## 2020-04-29 DIAGNOSIS — M6281 Muscle weakness (generalized): Secondary | ICD-10-CM

## 2020-04-29 DIAGNOSIS — M25612 Stiffness of left shoulder, not elsewhere classified: Secondary | ICD-10-CM

## 2020-04-29 DIAGNOSIS — M545 Low back pain, unspecified: Secondary | ICD-10-CM

## 2020-04-29 DIAGNOSIS — M25562 Pain in left knee: Secondary | ICD-10-CM

## 2020-04-29 DIAGNOSIS — G8929 Other chronic pain: Secondary | ICD-10-CM

## 2020-04-29 DIAGNOSIS — R2689 Other abnormalities of gait and mobility: Secondary | ICD-10-CM

## 2020-04-29 NOTE — Telephone Encounter (Signed)
I called patient to make appt with Dr. Roda Shutters for surgical consult (please see [name] message).  Patient states that this is a [employer] Comp injury and he will have to be approved from them prior to being able to schedule. Could you please reach out to adjustor and let them know MRI findings indicate patient will need surgical repair?  [name] has recommended Dr. Roda Shutters. Thanks.

## 2020-04-30 NOTE — Therapy (Signed)
[facility] Outpatient Rehabilitation [facility] [address] [place], [place], [address] Phone: [phone]   Fax:  [phone]  Physical Therapy Treatment  Patient Details  Name: Arleigh Odowd MRN: [id number] Date of Birth: [birth year] Referring Provider (PT): Lavada Mesi, MD   Encounter Date: 04/29/2020   PT End of Session - 04/29/20 0928    Visit Number 6    Number of Visits 17    Date for PT Re-Evaluation 05/30/20    Authorization Type MED PAY ASSURANCE; [place] MEDICAID [employer].    PT Start Time 0921    PT Stop Time 1015    PT Time Calculation (min) 54 min    Activity Tolerance Patient tolerated treatment well;Patient limited by pain    Behavior During Therapy [facility] for tasks assessed/performed           Past Medical History:  Diagnosis Date  . Chronic kidney disease    only has one kidney  . Diabetes mellitus without complication (HCC)   . Essential hypertension   . Hyperlipidemia   . Hypertension    Phreesia 01/13/2020    Past Surgical History:  Procedure Laterality Date  . KIDNEY DONATION    . MANDIBLE FRACTURE SURGERY    . ROTATOR CUFF REPAIR Right     There were no vitals filed for this visit.   Subjective Assessment - 04/29/20 0926    Subjective Pt reports his low back is feeling better c the area of pain more localized, primarily in the post/lat gluteal area.    Patient Stated Goals To improve the use of his L shoulder to being similar to his R. To return to driving a truck.    Currently in Pain? Yes    Pain Score 10-Worst pain ever    Pain Location Shoulder    Pain Orientation Left    Pain Descriptors / Indicators Aching    Pain Type Chronic pain    Pain Onset 1 to 4 weeks ago    Pain Frequency Constant    Pain Score 5    Pain Location Back    Pain Orientation Left                             [facility] Adult PT Treatment/Exercise - 04/30/20 0001      Exercises   Exercises Lumbar       Lumbar Exercises: Stretches   Active Hamstring Stretch Limitations L and R; 20 sec; 2x    Single Knee to Chest Stretch Left;3 reps;20 seconds    ITB Stretch Left;1 rep;10 seconds    ITB Stretch Limitations aggrevate pt's pain and was discontinued    Piriformis Stretch 20 seconds;Left;3 reps    Figure 4 Stretch 3 reps;20 seconds   Lt   Figure 4 Stretch Limitations L hip ER      Lumbar Exercises: Aerobic   Nustep 5 mins; L4; UEs/LEs      Manual Therapy   Manual Therapy Soft tissue mobilization    Soft tissue mobilization STM to the L gluteal area, pt was only able to tolerate light pressure                    PT Short Term Goals - 04/20/20 1646      PT SHORT TERM GOAL #1   Title Pt will be Ind in an initial HEP    Baseline 110 without pain    Period Weeks  Status On-going    Target Date 04/19/20      PT SHORT TERM GOAL #2   Title Pt will voice understanding of messures to assist in pain reduction    Baseline 5/5 gross    Time 3    Period Weeks    Status On-going    Target Date 04/19/20      PT SHORT TERM GOAL #3   Title Patient will increase pain free hip flexion to 100 degrees    Time 3    Period Weeks    Status New    Target Date 05/11/20      PT SHORT TERM GOAL #4   Title Patient will increase gross left hip strength 4+/5    Time 3    Period Weeks    Status New    Target Date 05/11/20             PT Long Term Goals - 04/20/20 1656      PT LONG TERM GOAL #1   Title Pt will be Ind in a final HEP to maintain or progress achieved LOF    Baseline 110 without significant pain    Time 6    Period Weeks    Status On-going      PT LONG TERM GOAL #2   Title Increase L shoulder strength  to 4+-5/5 for improved 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    Period Weeks    Status On-going      PT LONG TERM GOAL #4   Title Pt's L shoulder will decreased to less than 3/10 c household activites and drivng a truck    Baseline 6-9/10    Period Weeks    Status On-going      PT LONG TERM GOAL #5   Title Patient will stand for 1 hour without self report of pain in order to perfrom further skilled therapy    Time 6    Period Weeks    Status New    Target Date 06/01/20      Additional Long Term Goals   Additional Long Term Goals Yes      PT LONG TERM GOAL #6   Title Patient will lie in bed without pain in his back and knee in order to sleep    Time 6    Period Weeks    Status New    Target Date 06/01/20                 Plan - 04/30/20 0716    Clinical Impression Statement With palpation, pt was very tender lateral to the L PSIS; the  L ASIS was inf. to the R and the L medial malleous was sup. to the R. PT was provided for L low back and LE flexibility. With stretching exs, pt was only able to tolerate a minimal degree of stretching. After application of moist heat at the end of the session, pt reported his L lowback/gluteal pain felt better.    Personal Factors and Comorbidities Comorbidity 1;Profession    Comorbidities DMll    Examination-Activity Limitations Carry;Lift;Reach Overhead    Examination-Participation Restrictions Occupation    Stability/Clinical Decision Making  Evolving/Moderate complexity    Clinical Decision Making Moderate    Rehab Potential Good    PT Frequency 2x / week    PT Duration 8 weeks    PT Treatment/Interventions ADLs/Self Care Home Management;Cryotherapy;Electrical Stimulation;Ultrasound;Traction;Moist Heat;Iontophoresis 4mg /ml Dexamethasone;Functional mobility training;Therapeutic activities;Therapeutic exercise;Balance training;Manual techniques;Patient/family education;Passive range of motion;Dry needling;Taping;Vasopneumatic Device;Joint Manipulations    PT Next  Visit Plan Further assessment of the L SI jt. Focus on the back and hip. hold on the shoulder until ortho progressess.    PT Home Exercise Plan [id number]    Consulted and Agree with Plan of Care Patient           Patient will benefit from skilled therapeutic intervention in order to improve the following deficits and impairments:  Decreased strength,Pain,Impaired UE functional use,Decreased range of motion  Visit Diagnosis: Acute pain of left shoulder  Acute pain of left knee  Stiffness of left shoulder, not elsewhere classified  Muscle weakness (generalized)  Chronic bilateral low back pain without sciatica  Acute bilateral low back pain with sciatica, sciatica laterality unspecified  Other abnormalities of gait and mobility     Problem List Patient Active Problem List   Diagnosis Date Noted  . Tobacco dependence 03/26/2019  . Essential hypertension 03/26/2019  . Type 2 diabetes mellitus without complication, without long-term current use of insulin (HCC) 03/26/2019  . Aneurysm, cerebral, nonruptured 03/26/2019    14/11/2018 MS, PT 04/30/20 7:42 AM  [facility] [address] [place], [place], [place] Phone: [phone]   Fax:  [phone]  Name: Gevork Ayyad MRN: Margrett Rud Date of Birth: November 17, 1960

## 2020-05-04 ENCOUNTER — Encounter: Payer: Medicaid Other | Admitting: Physical Therapy

## 2020-05-05 NOTE — Telephone Encounter (Signed)
Tamela Oddi  Still trying to get this authorization from [facility] to have Dr. Roda Shutters, MD see Mr. Dorrance. Just a fyi.  I forward the WC the MRI report to review and hopefully they will ok the authorization in  regards to Dr. Roda Shutters, MD consult visit.  Thank you. Morrison Old

## 2020-05-06 ENCOUNTER — Ambulatory Visit: Payer: Worker's Compensation | Admitting: Physical Therapy

## 2020-05-06 ENCOUNTER — Other Ambulatory Visit: Payer: Self-pay

## 2020-05-06 ENCOUNTER — Encounter: Payer: Self-pay | Admitting: Physical Therapy

## 2020-05-06 DIAGNOSIS — G8929 Other chronic pain: Secondary | ICD-10-CM

## 2020-05-06 DIAGNOSIS — M25612 Stiffness of left shoulder, not elsewhere classified: Secondary | ICD-10-CM

## 2020-05-06 DIAGNOSIS — M25562 Pain in left knee: Secondary | ICD-10-CM

## 2020-05-06 DIAGNOSIS — R2689 Other abnormalities of gait and mobility: Secondary | ICD-10-CM

## 2020-05-06 DIAGNOSIS — M25512 Pain in left shoulder: Secondary | ICD-10-CM

## 2020-05-06 DIAGNOSIS — M545 Low back pain, unspecified: Secondary | ICD-10-CM

## 2020-05-06 DIAGNOSIS — M6281 Muscle weakness (generalized): Secondary | ICD-10-CM
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
## 2020-05-07 NOTE — Telephone Encounter (Signed)
Noted  

## 2020-05-07 NOTE — Telephone Encounter (Signed)
FYI.  Just wanted you to be aware that Morrison Old is working to get appointment with Dr. Roda Shutters per [name] request. She is still awaiting [facility] approval.

## 2020-05-11 ENCOUNTER — Ambulatory Visit: Payer: Medicaid Other | Admitting: Physical Therapy

## 2020-05-13 ENCOUNTER — Ambulatory Visit: Payer: Medicaid Other | Attending: Internal Medicine | Admitting: Physical Therapy

## 2020-05-13 ENCOUNTER — Other Ambulatory Visit: Payer: Self-pay

## 2020-05-13 ENCOUNTER — Encounter: Payer: Self-pay | Admitting: Physical Therapy

## 2020-05-13 DIAGNOSIS — M25612 Stiffness of left shoulder, not elsewhere classified: Secondary | ICD-10-CM

## 2020-05-13 DIAGNOSIS — M25562 Pain in left knee: Secondary | ICD-10-CM

## 2020-05-13 DIAGNOSIS — M6281 Muscle weakness (generalized): Secondary | ICD-10-CM

## 2020-05-13 DIAGNOSIS — M25512 Pain in left shoulder: Secondary | ICD-10-CM | POA: Diagnosis not present

## 2020-05-13 NOTE — Therapy (Signed)
[facility] Outpatient Rehabilitation [facility] [address] [place], [place], [address] Phone: [phone]   Fax:  [phone]  Physical Therapy Treatment  Patient Details  Name: Curtis Williamson MRN: [id number] Date of Birth: [birth year] Referring Provider (PT): Lavada Mesi, MD   Encounter Date: 05/13/2020   PT End of Session - 05/13/20 1402    Visit Number 8    Number of Visits 17    Authorization Type MED PAY ASSURANCE; [place] MEDICAID [employer].    PT Start Time 1015    PT Stop Time 1057    PT Time Calculation (min) 42 min    Activity Tolerance Patient tolerated treatment well;Patient limited by pain    Behavior During Therapy [facility] for tasks assessed/performed           Past Medical History:  Diagnosis Date  . Chronic kidney disease    only has one kidney  . Diabetes mellitus without complication (HCC)   . Essential hypertension   . Hyperlipidemia   . Hypertension    Phreesia 01/13/2020    Past Surgical History:  Procedure Laterality Date  . KIDNEY DONATION    . MANDIBLE FRACTURE SURGERY    . ROTATOR CUFF REPAIR Right     There were no vitals filed for this visit.   Subjective Assessment - 05/13/20 1023    Subjective Patient woke up a few mornings ago and had significant pain in his back. he was unable to get into his car to come to therapy.    Pertinent History Fx of the L 3rd ray    Limitations Lifting;House hold activities    Patient Stated Goals To improve the use of his L shoulder to being similar to his R. To return to driving a truck.    Currently in Pain? Yes    Pain Score 7     Pain Location Back    Pain Orientation Right    Pain Descriptors / Indicators Aching    Pain Type Chronic pain    Pain Radiating Towards into the right hip    Pain Onset 1 to 4 weeks ago    Pain Frequency Constant    Aggravating Factors  bending, sleeping    Pain Relieving Factors rest and medication    Effect of Pain on Daily  Activities difficulty standing and walking    Multiple Pain Sites No                             [facility] Adult PT Treatment/Exercise - 05/13/20 0001      Lumbar Exercises: Stretches   Single Knee to Chest Stretch Left;3 reps;20 seconds    Lower Trunk Rotation Limitations x20    Piriformis Stretch 20 seconds;Left;3 reps      Lumbar Exercises: Aerobic   Nustep 5 mins; L4; UEs/LEs      Lumbar Exercises: Supine   Other Supine Lumbar Exercises supine clam shell x20; supine march x20 each leg;      Manual Therapy   Manual Therapy Passive ROM    Soft tissue mobilization STM to the L gluteal area, Patient tolerated icreased pressure today; trigger point release to upper trap.    Passive ROM PROM of the left hip into flexion ER and IR         LAD gentle oscillations grade II    Trigger Point Dry Needling - 05/13/20 0001    Consent Given? Yes    Education Handout Provided  Yes    Muscles Treated Back/Hip Gluteus medius;Lumbar multifidi    Gluteus Medius Response Twitch response elicited;Palpable increased muscle length    Lumbar multifidi Response Twitch response elicited;Palpable increased muscle length                PT Education - 05/13/20 1025    Education Details reveiwed benfits and risks of TPDN    Person(s) Educated Patient    Methods Explanation;Tactile cues;Demonstration;Verbal cues    Comprehension Verbalized understanding;Returned demonstration;Verbal cues required;Tactile cues required            PT Short Term Goals - 04/20/20 1646      PT SHORT TERM GOAL #1   Title Pt will be Ind in an initial HEP    Baseline 110 without pain    Period Weeks    Status On-going    Target Date 04/19/20      PT SHORT TERM GOAL #2   Title Pt will voice understanding of messures to assist in pain reduction    Baseline 5/5 gross    Time 3    Period Weeks    Status On-going    Target Date 04/19/20      PT SHORT TERM GOAL #3   Title Patient will  increase pain free hip flexion to 100 degrees    Time 3    Period Weeks    Status New    Target Date 05/11/20      PT SHORT TERM GOAL #4   Title Patient will increase gross left hip strength 4+/5    Time 3    Period Weeks    Status New    Target Date 05/11/20             PT Long Term Goals - 04/20/20 1656      PT LONG TERM GOAL #1   Title Pt will be Ind in a final HEP to maintain or progress achieved LOF    Baseline 110 without significant pain    Time 6    Period Weeks    Status On-going      PT LONG TERM GOAL #2   Title Increase L shoulder strength  to 4+-5/5 for improved 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    Period Weeks    Status On-going      PT LONG TERM GOAL #4   Title Pt's L shoulder will decreased to less than 3/10 c household activites and drivng a truck    Baseline 6-9/10    Period Weeks    Status On-going      PT LONG TERM GOAL #5   Title Patient will stand for 1 hour without self report of pain in order to perfrom further skilled therapy    Time 6    Period Weeks    Status New    Target Date 06/01/20      Additional Long Term Goals   Additional Long Term Goals Yes      PT LONG TERM GOAL #6   Title Patient will lie in bed without pain in his back and knee in order to sleep    Time 6    Period Weeks  Status New    Target Date 06/01/20                 Plan - 05/13/20 1403    Clinical Impression Statement Patient had a significant tiwtch with needling of his lower lumbar spine. He had a good twtich in his gluteals. He reported feeling looser after needling and manual therapy. Therapy reviewed stretches to perfrom at home. He has done well up until a few days ago. He was encouraged to walk as much as he can  over the next few days, use his tennis ball, and use his stretches. He will see the MD next week about his shoulder.    Personal Factors and Comorbidities Comorbidity 1;Profession    Comorbidities DMll    Examination-Activity Limitations Carry;Lift;Reach Overhead    Examination-Participation Restrictions Occupation    Stability/Clinical Decision Making Evolving/Moderate complexity    Clinical Decision Making Moderate    Rehab Potential Good           Patient will benefit from skilled therapeutic intervention in order to improve the following deficits and impairments:  Decreased strength,Pain,Impaired UE functional use,Decreased range of motion  Visit Diagnosis: Acute pain of left shoulder  Acute pain of left knee  Stiffness of left shoulder, not elsewhere classified  Muscle weakness (generalized)     Problem List Patient Active Problem List   Diagnosis Date Noted  . Tobacco dependence 03/26/2019  . Essential hypertension 03/26/2019  . Type 2 diabetes mellitus without complication, without long-term current use of insulin (HCC) 03/26/2019  . Aneurysm, cerebral, nonruptured 03/26/2019    Dessie Coma PT DPT  05/13/2020, 3:44 PM  [facility] [address] [place], [place], [address] Phone: [phone]   Fax:  [phone]  Name: Odus Clasby MRN: [id number] Date of Birth: 10-Jun-1960

## 2020-05-19 ENCOUNTER — Other Ambulatory Visit: Payer: Self-pay

## 2020-05-19 ENCOUNTER — Other Ambulatory Visit: Payer: Self-pay | Admitting: Internal Medicine

## 2020-05-19 ENCOUNTER — Ambulatory Visit (INDEPENDENT_AMBULATORY_CARE_PROVIDER_SITE_OTHER): Payer: Worker's Compensation | Admitting: Orthopaedic Surgery

## 2020-05-19 ENCOUNTER — Encounter: Payer: Self-pay | Admitting: Orthopaedic Surgery

## 2020-05-19 VITALS — Ht 71.0 in | Wt 190.0 lb

## 2020-05-19 DIAGNOSIS — M25512 Pain in left shoulder: Secondary | ICD-10-CM

## 2020-05-19 DIAGNOSIS — E119 Type 2 diabetes mellitus without complications: Secondary | ICD-10-CM
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
## 2020-05-20 ENCOUNTER — Ambulatory Visit (INDEPENDENT_AMBULATORY_CARE_PROVIDER_SITE_OTHER): Payer: Worker's Compensation | Admitting: Orthopedic Surgery

## 2020-05-20 DIAGNOSIS — M25512 Pain in left shoulder: Secondary | ICD-10-CM | POA: Diagnosis not present

## 2020-05-20 DIAGNOSIS — S46012D Strain of muscle(s) and tendon(s) of the rotator cuff of left shoulder, subsequent encounter: Secondary | ICD-10-CM

## 2020-05-20 MED ORDER — HYDROCODONE-ACETAMINOPHEN 10-325 MG PO TABS: 0.5000 | ORAL_TABLET | Freq: Two times a day (BID) | ORAL | 0 refills | Status: DC | PRN

## 2020-05-21 ENCOUNTER — Ambulatory Visit: Payer: Worker's Compensation | Attending: Internal Medicine

## 2020-05-21 ENCOUNTER — Other Ambulatory Visit: Payer: Self-pay

## 2020-05-21 DIAGNOSIS — M25512 Pain in left shoulder: Secondary | ICD-10-CM | POA: Insufficient documentation

## 2020-05-21 DIAGNOSIS — M5440 Lumbago with sciatica, unspecified side: Secondary | ICD-10-CM | POA: Diagnosis present

## 2020-05-21 DIAGNOSIS — M6281 Muscle weakness (generalized): Secondary | ICD-10-CM | POA: Diagnosis present

## 2020-05-21 DIAGNOSIS — M545 Low back pain, unspecified: Secondary | ICD-10-CM | POA: Diagnosis present

## 2020-05-21 DIAGNOSIS — M62830 Muscle spasm of back: Secondary | ICD-10-CM | POA: Diagnosis present

## 2020-05-21 DIAGNOSIS — M25551 Pain in right hip: Secondary | ICD-10-CM | POA: Insufficient documentation

## 2020-05-21 DIAGNOSIS — G8929 Other chronic pain: Secondary | ICD-10-CM | POA: Diagnosis present

## 2020-05-21 DIAGNOSIS — M25562 Pain in left knee: Secondary | ICD-10-CM | POA: Insufficient documentation

## 2020-05-21 DIAGNOSIS — M25612 Stiffness of left shoulder, not elsewhere classified: Secondary | ICD-10-CM | POA: Diagnosis present

## 2020-05-21 DIAGNOSIS — R2689 Other abnormalities of gait and mobility: Secondary | ICD-10-CM | POA: Diagnosis present

## 2020-05-22 ENCOUNTER — Encounter: Payer: Self-pay | Admitting: Orthopedic Surgery
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
## 2020-05-22 NOTE — Therapy (Signed)
[facility] Outpatient Rehabilitation [facility] [address] [place], [place], [address] Phone: [phone]   Fax:  [phone]  Physical Therapy Treatment  Patient Details  Name: Curtis Williamson MRN: [id number] Date of Birth: November 23, 1960 Referring Provider (PT): Lavada Mesi, MD   Encounter Date: 05/21/2020   PT End of Session - 05/22/20 4917    Visit Number 9    Number of Visits 17    Date for PT Re-Evaluation 05/30/20    Authorization Type MED PAY ASSURANCE; [place] MEDICAID [employer].    PT Start Time 1005    PT Stop Time 1050    PT Time Calculation (min) 45 min    Activity Tolerance Patient tolerated treatment well;Patient limited by pain    Behavior During Therapy [facility] for tasks assessed/performed           Past Medical History:  Diagnosis Date  . Chronic kidney disease    only has one kidney  . Diabetes mellitus without complication (HCC)   . Essential hypertension   . Hyperlipidemia   . Hypertension    Phreesia 01/13/2020    Past Surgical History:  Procedure Laterality Date  . KIDNEY DONATION    . MANDIBLE FRACTURE SURGERY    . ROTATOR CUFF REPAIR Right     There were no vitals filed for this visit.   Subjective Assessment - 05/21/20 1021    Subjective Pt reports following the TPDN the last PT session his R low back has been feeling better. Pt states he is to have L shoulder surgery as soon as possible. pt reports significantpain with the L upper trap and interscapula areas.    Currently in Pain? Yes    Pain Score 4     Pain Location Back    Pain Orientation Right;Lower    Pain Descriptors / Indicators Aching    Pain Onset 1 to 4 weeks ago    Pain Frequency Intermittent    Aggravating Factors  Prolonged standing and walking    Pain Relieving Factors Sleeping    Effect of Pain on Daily Activities difficulty standing and walking                             [facility] Adult PT Treatment/Exercise - 05/22/20  0001      Lumbar Exercises: Stretches   Single Knee to Chest Stretch Left;20 seconds;2 reps    Lower Trunk Rotation Limitations 10x, 3 sec    Piriformis Stretch Right;Left;2 reps;20 seconds      Lumbar Exercises: Supine   Other Supine Lumbar Exercises With core engagement; supine clam shell x20; supine march x20 each leg c red Tband for both exercises      Manual Therapy   Manual Therapy Soft tissue mobilization    Soft tissue mobilization STM to the L shoulder upper trap, rhomboids and paraspinals. Trigger point pressure applied s muscle release, but with pt reporting a decrease in pain.            Trigger Point Dry Needling - 05/22/20 0001    Consent Given? Yes    Education Handout Provided Yes    Muscles Treated Back/Hip Gluteus medius;Gluteus maximus    Gluteus Medius Response Twitch response elicited;Palpable increased muscle length    Gluteus Maximus Response Twitch response elicited;Palpable increased muscle length    Lumbar multifidi Response Twitch response elicited;Palpable increased muscle length  PT Short Term Goals - 04/20/20 1646      PT SHORT TERM GOAL #1   Title Pt will be Ind in an initial HEP    Baseline 110 without pain    Period Weeks    Status On-going    Target Date 04/19/20      PT SHORT TERM GOAL #2   Title Pt will voice understanding of messures to assist in pain reduction    Baseline 5/5 gross    Time 3    Period Weeks    Status On-going    Target Date 04/19/20      PT SHORT TERM GOAL #3   Title Patient will increase pain free hip flexion to 100 degrees    Time 3    Period Weeks    Status New    Target Date 05/11/20      PT SHORT TERM GOAL #4   Title Patient will increase gross left hip strength 4+/5    Time 3    Period Weeks    Status New    Target Date 05/11/20             PT Long Term Goals - 04/20/20 1656      PT LONG TERM GOAL #1   Title Pt will be Ind in a final HEP to maintain or progress  achieved LOF    Baseline 110 without significant pain    Time 6    Period Weeks    Status On-going      PT LONG TERM GOAL #2   Title Increase L shoulder strength  to 4+-5/5 for improved 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    Period Weeks    Status On-going      PT LONG TERM GOAL #4   Title Pt's L shoulder will decreased to less than 3/10 c household activites and drivng a truck    Baseline 6-9/10    Period Weeks    Status On-going      PT LONG TERM GOAL #5   Title Patient will stand for 1 hour without self report of pain in order to perfrom further skilled therapy    Time 6    Period Weeks    Status New    Target Date 06/01/20      Additional Long Term Goals   Additional Long Term Goals Yes      PT LONG TERM GOAL #6   Title Patient will lie in bed without pain in his back and knee in order to sleep    Time 6    Period Weeks    Status New    Target Date 06/01/20                 Plan - 05/22/20 8527    Clinical Impression Statement Pt reports good relief from the last PT session. TPDN was completed by Anne Ng, DPT/Certiffied Dry Needling Practitioner. Twitch responses were elicited. PT was then completed for lumbopelvic/ LE stretches. With report of increased upper trap and interscapular pain associated with pt's L shoulder injury, STM was provided to these areas. Pt reported relief of all treated areas at end of session.  Personal Factors and Comorbidities Comorbidity 1;Profession    Comorbidities DMll    Examination-Activity Limitations Carry;Lift;Reach Overhead    Examination-Participation Restrictions Occupation    Stability/Clinical Decision Making Evolving/Moderate complexity    Clinical Decision Making Moderate    Rehab  Potential Good    PT Duration 8 weeks    PT Treatment/Interventions ADLs/Self Care Home Management;Cryotherapy;Electrical Stimulation;Ultrasound;Traction;Moist Heat;Iontophoresis 4mg /ml Dexamethasone;Functional mobility training;Therapeutic activities;Therapeutic exercise;Balance training;Manual techniques;Patient/family education;Passive range of motion;Dry needling;Taping;Vasopneumatic Device;Joint Manipulations    PT Next Visit Plan Assess response to TPDN    PT Home Exercise Plan [id number]    Consulted and Agree with Plan of Care Patient           Patient will benefit from skilled therapeutic intervention in order to improve the following deficits and impairments:  Decreased strength,Pain,Impaired UE functional use,Decreased range of motion  Visit Diagnosis: Acute pain of left shoulder  Stiffness of left shoulder, not elsewhere classified  Muscle weakness (generalized)  Chronic bilateral low back pain without sciatica  Other abnormalities of gait and mobility  Muscle spasm of back     Problem List Patient Active Problem List   Diagnosis Date Noted  . Acute pain of left shoulder 05/19/2020  . Tobacco dependence 03/26/2019  . Essential hypertension 03/26/2019  . Type 2 diabetes mellitus without complication, without long-term current use of insulin (HCC) 03/26/2019  . Aneurysm, cerebral, nonruptured 03/26/2019    14/11/2018 MS, PT 05/22/20 6:51 AM  [facility] [address] [place], [place], [place] Phone: [phone]   Fax:  [phone]  Name: Curtis Williamson MRN: [id number] Date of Birth: 02/28/1961

## 2020-05-24 ENCOUNTER — Other Ambulatory Visit: Payer: Self-pay | Admitting: Family Medicine

## 2020-05-25 ENCOUNTER — Ambulatory Visit: Payer: Worker's Compensation | Admitting: Physical Therapy

## 2020-05-27 ENCOUNTER — Ambulatory Visit: Payer: Worker's Compensation

## 2020-05-28 ENCOUNTER — Ambulatory Visit: Payer: Medicaid Other | Admitting: Family Medicine

## 2020-05-30 ENCOUNTER — Encounter: Payer: Self-pay | Admitting: Physical Therapy

## 2020-06-01 ENCOUNTER — Ambulatory Visit: Payer: Worker's Compensation | Admitting: Physical Therapy

## 2020-06-04 ENCOUNTER — Ambulatory Visit: Payer: Worker's Compensation

## 2020-06-04 ENCOUNTER — Other Ambulatory Visit: Payer: Self-pay

## 2020-06-04 DIAGNOSIS — M5440 Lumbago with sciatica, unspecified side: Secondary | ICD-10-CM

## 2020-06-04 DIAGNOSIS — G8929 Other chronic pain: Secondary | ICD-10-CM

## 2020-06-04 DIAGNOSIS — M545 Low back pain, unspecified: Secondary | ICD-10-CM

## 2020-06-04 DIAGNOSIS — M25512 Pain in left shoulder: Secondary | ICD-10-CM | POA: Diagnosis not present

## 2020-06-04 DIAGNOSIS — M6281 Muscle weakness (generalized): Secondary | ICD-10-CM

## 2020-06-04 DIAGNOSIS — M25562 Pain in left knee: Secondary | ICD-10-CM

## 2020-06-04 DIAGNOSIS — M25551 Pain in right hip: Secondary | ICD-10-CM

## 2020-06-04 DIAGNOSIS — M25612 Stiffness of left shoulder, not elsewhere classified: Secondary | ICD-10-CM

## 2020-06-04 DIAGNOSIS — R2689 Other abnormalities of gait and mobility: Secondary | ICD-10-CM

## 2020-06-04 DIAGNOSIS — M62830 Muscle spasm of back: Secondary | ICD-10-CM

## 2020-06-04 NOTE — Therapy (Signed)
[facility] [address] [place], [place], [address] Phone: [phone]   Fax:  [phone]  Physical Therapy Treatment/Progress Note/Re-certification   Patient Details  Name: Curtis Williamson MRN: [id number] Date of Birth: 10/25/1960 Referring Provider (PT): Lavada Mesi, MD  Progress Note Reporting Period 03/28/21 to 06/04/20  See note below for Objective Data and Assessment of Progress/Goals.   Encounter Date: 06/04/2020   PT End of Session - 06/04/20 1325    Visit Number 10    Number of Visits 17    Date for PT Re-Evaluation 07/03/20    Authorization Type MED PAY ASSURANCE; [place] MEDICAID [employer].    Progress Note Due on Visit 10    PT Start Time 1045    PT Stop Time 1145    PT Time Calculation (min) 60 min    Activity Tolerance Patient tolerated treatment well;Patient limited by pain    Behavior During Therapy [facility] for tasks assessed/performed           Past Medical History:  Diagnosis Date  . Chronic kidney disease    only has one kidney  . Diabetes mellitus without complication (HCC)   . Essential hypertension   . Hyperlipidemia   . Hypertension    Phreesia 01/13/2020    Past Surgical History:  Procedure Laterality Date  . KIDNEY DONATION    . MANDIBLE FRACTURE SURGERY    . ROTATOR CUFF REPAIR Right     There were no vitals filed for this visit.   Subjective Assessment - 06/04/20 1058    Subjective Pt reports his low back has been doing much better with the dry needling being very helpful. He rates his low back pain a 1-2/10.    Pertinent History Fx of the L 3rd ray    Limitations Lifting;House hold activities    Currently in Pain? Yes    Pain Score 2     Pain Location Back    Pain Orientation Right;Left    Pain Descriptors / Indicators Aching    Pain Type Chronic pain    Pain Onset More than a month ago    Pain Frequency Intermittent    Pain Score 9    Pain Location  Shoulder    Pain Orientation Left    Pain Descriptors / Indicators Aching    Pain Type Chronic pain    Pain Onset More than a month ago    Pain Frequency Constant                             [facility] Adult PT Treatment/Exercise - 06/04/20 0001      Lumbar Exercises: Stretches   Active Hamstring Stretch Right;2 reps;20 seconds    Active Hamstring Stretch Limitations seated    Single Knee to Chest Stretch Left;20 seconds;2 reps    Lower Trunk Rotation Limitations 5x; 10 sec    Piriformis Stretch Right;Left;2 reps;20 seconds      Lumbar Exercises: Supine   Pelvic Tilt 10 seconds   3 sec   Clam 15 reps;3 seconds    Clam Limitations Green Tband    Other Supine Lumbar Exercises Hip add sets c ball squeeze 15x; 5 sec      Shoulder Exercises: Standing   Flexion AAROM;10 reps   table top assist                 PT Education - 06/04/20 1324    Education Details Final  HEP    Person(s) Educated Patient    Methods Explanation;Demonstration;Tactile cues;Verbal cues;Handout    Comprehension Tactile cues required;Verbal cues required;Returned demonstration;Verbalized understanding            PT Short Term Goals - 06/04/20 1328      PT SHORT TERM GOAL #1   Title Pt will be Ind in an initial HEP    Status Achieved    Target Date 06/04/20      PT SHORT TERM GOAL #2   Title Pt will voice understanding of messures to assist in pain reduction    Status Achieved    Target Date 06/04/20      PT SHORT TERM GOAL #3   Title Patient will increase pain free hip flexion to 100 degrees    Baseline perfroming at home    Period Weeks    Status On-going    Target Date 05/11/20      PT SHORT TERM GOAL #4   Title Patient will increase gross left hip strength 4+/5    Period Weeks    Status On-going    Target Date 05/11/20             PT Long Term Goals - 06/04/20 1330      PT LONG TERM GOAL #1   Title Pt will be Ind in a final HEP to maintain or progress  achieved LOF    Status Achieved    Target Date 06/04/20      PT LONG TERM GOAL #2   Title Increase L shoulder strength  to 4+-5/5 for improved functional 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 Deferred due to RCT and surgery scheduled for 07/06/20.    Status Deferred      PT LONG TERM GOAL #4   Title Pt's L shoulder will decreased to less than 3/10 c household activites and drivng a truck. Deferred due to RCT and sugery scheduled for 07/06/20    Status Deferred      PT LONG TERM GOAL #5   Title Patient will stand for 1 hour without self report of pain in order to perfrom further skilled therapy    Status On-going    Target Date 07/03/20                 Plan - 06/04/20 1328    Clinical Impression Statement Pt is reporting good improvement in his low back pain. PT was completed to develop a HEP for lumbopelvic mobility and strengthening. Following the PT session, pt reported he was not experiencing back pain. Pt completed the ther ex/HEP properly. Currently, the pt would like to hold on PT until his L shoulder surgery on 07/06/20, but would like the option to come in for treatment to his low back if need be. Pt will benefit from PT 1w4 to reduce low back pain and optimize functional mobility. Pt is to schedule additional PT appt. 1x/week over this 4 week period as indicated. Will DC this PT episode on 07/04/19 prior to his scheduled L shoulder surgery.    Personal Factors and Comorbidities Comorbidity 1;Profession    Comorbidities DMll    Examination-Activity Limitations Carry;Lift;Reach Overhead    Examination-Participation Restrictions Occupation  Stability/Clinical Decision Making Evolving/Moderate complexity    Rehab Potential Good    PT  Frequency 2x / week    PT Duration 8 weeks    PT Treatment/Interventions ADLs/Self Care Home Management;Cryotherapy;Electrical Stimulation;Ultrasound;Traction;Moist Heat;Iontophoresis 4mg /ml Dexamethasone;Functional mobility training;Therapeutic activities;Therapeutic exercise;Balance training;Manual techniques;Patient/family education;Passive range of motion;Dry needling;Taping;Vasopneumatic Device;Joint Manipulations    PT Home Exercise Plan [id number]    Consulted and Agree with Plan of Care Patient           Patient will benefit from skilled therapeutic intervention in order to improve the following deficits and impairments:  Decreased strength,Pain,Impaired UE functional use,Decreased range of motion  Visit Diagnosis: Acute pain of left shoulder  Stiffness of left shoulder, not elsewhere classified  Muscle weakness (generalized)  Chronic bilateral low back pain without sciatica  Other abnormalities of gait and mobility  Muscle spasm of back  Acute pain of left knee  Acute bilateral low back pain with sciatica, sciatica laterality unspecified  Pain in right hip     Problem List Patient Active Problem List   Diagnosis Date Noted  . Acute pain of left shoulder 05/19/2020  . Tobacco dependence 03/26/2019  . Essential hypertension 03/26/2019  . Type 2 diabetes mellitus without complication, without long-term current use of insulin (HCC) 03/26/2019  . Aneurysm, cerebral, nonruptured 03/26/2019    14/11/2018 MS, PT 06/04/20 1:51 PM  [facility] [address] [place], [place], [place] Phone: [phone]   Fax:  [phone]  Name: Tahjai Schetter MRN: Margrett Rud Date of Birth: Sep 02, 1960

## 2020-07-04 ENCOUNTER — Other Ambulatory Visit: Payer: Self-pay | Admitting: Internal Medicine

## 2020-07-04 DIAGNOSIS — E119 Type 2 diabetes mellitus without complications: Secondary | ICD-10-CM

## 2020-07-06 ENCOUNTER — Other Ambulatory Visit: Payer: Self-pay | Admitting: Surgical

## 2020-07-06 ENCOUNTER — Telehealth: Payer: Self-pay | Admitting: Orthopedic Surgery

## 2020-07-06 ENCOUNTER — Encounter: Payer: Self-pay | Admitting: Orthopedic Surgery

## 2020-07-06 DIAGNOSIS — M7522 Bicipital tendinitis, left shoulder: Secondary | ICD-10-CM

## 2020-07-06 DIAGNOSIS — M75122 Complete rotator cuff tear or rupture of left shoulder, not specified as traumatic: Secondary | ICD-10-CM

## 2020-07-06 MED ORDER — KETOROLAC TROMETHAMINE 10 MG PO TABS: 10.0000 mg | ORAL_TABLET | Freq: Three times a day (TID) | ORAL | 0 refills | Status: DC | PRN

## 2020-07-06 MED ORDER — OXYCODONE-ACETAMINOPHEN 5-325 MG PO TABS: 1.0000 | ORAL_TABLET | ORAL | 0 refills | Status: DC | PRN

## 2020-07-06 NOTE — Telephone Encounter (Signed)
Okay to take two of the oxycodone pills on occasion for pain control. Toradol should help and recommend ice to the shoulder, 30 mins on/30 mins off.  If still no control later tomorrow, can switch things up.  Do not consistently take two pills or else there is risk of liver damage from the tylenol

## 2020-07-06 NOTE — Telephone Encounter (Signed)
Please advise. Thanks.  

## 2020-07-06 NOTE — Telephone Encounter (Signed)
Pt called and is wondering if he can get something stronger than oxy 5s. Give him a call [phone]

## 2020-07-07 ENCOUNTER — Other Ambulatory Visit: Payer: Self-pay | Admitting: Family Medicine

## 2020-07-07 ENCOUNTER — Other Ambulatory Visit: Payer: Self-pay | Admitting: Surgical

## 2020-07-07 ENCOUNTER — Telehealth: Payer: Self-pay | Admitting: Surgical

## 2020-07-07 MED ORDER — OXYCODONE HCL 10 MG PO TABS
10.0000 mg | ORAL_TABLET | ORAL | 0 refills | Status: DC | PRN
Start: 2020-07-07 — End: 2020-07-14

## 2020-07-07 NOTE — Telephone Encounter (Signed)
Pt called stating he can't take the toradol because it makes him sick and he doesn't want to take two oxycodone 5's because then he'll run out way before it's time for another refill. He would like to have oxycodone 10 sent in instead please.   [phone]

## 2020-07-07 NOTE — Telephone Encounter (Signed)
New message from patient came in today.  I have sent this to [facility]. Waiting to be advised.

## 2020-07-07 NOTE — Telephone Encounter (Signed)
Sent in oxycodone 10mg , will transition to 5mg  likely after this RX

## 2020-07-07 NOTE — Telephone Encounter (Signed)
See below

## 2020-07-08 ENCOUNTER — Other Ambulatory Visit: Payer: Self-pay | Admitting: Internal Medicine

## 2020-07-08 DIAGNOSIS — E1165 Type 2 diabetes mellitus with hyperglycemia: Secondary | ICD-10-CM

## 2020-07-08 DIAGNOSIS — I10 Essential (primary) hypertension: Secondary | ICD-10-CM

## 2020-07-10 ENCOUNTER — Other Ambulatory Visit: Payer: Self-pay | Admitting: Surgical

## 2020-07-10 ENCOUNTER — Telehealth: Payer: Self-pay | Admitting: Orthopedic Surgery

## 2020-07-10 MED ORDER — DOCUSATE SODIUM 100 MG PO CAPS: 100.0000 mg | ORAL_CAPSULE | Freq: Two times a day (BID) | ORAL | 0 refills | Status: AC

## 2020-07-10 MED ORDER — ONDANSETRON HCL 4 MG PO TABS: 4.0000 mg | ORAL_TABLET | Freq: Three times a day (TID) | ORAL | 0 refills | Status: DC | PRN

## 2020-07-10 NOTE — Telephone Encounter (Signed)
Patient called asked when can he remove the sling? Patient said he is in constant pain and said the Oxycodone is making him nauseated. Patient said he is constipated as well.  The number to contact patient is [phone]

## 2020-07-10 NOTE — Telephone Encounter (Signed)
Do not remove sling except for sleep/shower/CPM.  I will call in zofran for his nausea and colace for constipation. Recommend Miralax if no improvement of constipation over weekend.

## 2020-07-10 NOTE — Telephone Encounter (Signed)
Please advise 

## 2020-07-13 ENCOUNTER — Ambulatory Visit (INDEPENDENT_AMBULATORY_CARE_PROVIDER_SITE_OTHER): Payer: Worker's Compensation | Admitting: Orthopedic Surgery

## 2020-07-13 ENCOUNTER — Other Ambulatory Visit: Payer: Self-pay

## 2020-07-13 ENCOUNTER — Ambulatory Visit (INDEPENDENT_AMBULATORY_CARE_PROVIDER_SITE_OTHER): Payer: Medicaid Other | Admitting: Internal Medicine

## 2020-07-13 ENCOUNTER — Encounter: Payer: Self-pay | Admitting: Internal Medicine

## 2020-07-13 ENCOUNTER — Telehealth: Payer: Self-pay

## 2020-07-13 VITALS — BP 128/88 | HR 73 | Resp 16 | Wt 193.2 lb

## 2020-07-13 DIAGNOSIS — E119 Type 2 diabetes mellitus without complications: Secondary | ICD-10-CM | POA: Diagnosis not present

## 2020-07-13 DIAGNOSIS — E785 Hyperlipidemia, unspecified: Secondary | ICD-10-CM | POA: Diagnosis not present

## 2020-07-13 DIAGNOSIS — S46012D Strain of muscle(s) and tendon(s) of the rotator cuff of left shoulder, subsequent encounter: Secondary | ICD-10-CM

## 2020-07-13 DIAGNOSIS — I10 Essential (primary) hypertension: Secondary | ICD-10-CM

## 2020-07-13 LAB — GLUCOSE, POCT (MANUAL RESULT ENTRY): POC Glucose: 141 mg/dl — AB (ref 70–99)

## 2020-07-13 LAB — POCT GLYCOSYLATED HEMOGLOBIN (HGB A1C): HbA1c, POC (controlled diabetic range): 7.2 % — AB (ref 0.0–7.0)

## 2020-07-13 MED ORDER — METFORMIN HCL 1000 MG PO TABS: 1000.0000 mg | ORAL_TABLET | Freq: Two times a day (BID) | ORAL | 3 refills | Status: DC

## 2020-07-13 MED ORDER — LISINOPRIL-HYDROCHLOROTHIAZIDE 10-12.5 MG PO TABS: 1.0000 | ORAL_TABLET | Freq: Every day | ORAL | 1 refills | Status: DC

## 2020-07-13 MED ORDER — EMPAGLIFLOZIN 10 MG PO TABS
10.0000 mg | ORAL_TABLET | Freq: Every day | ORAL | 1 refills | Status: DC
Start: 2020-07-13 — End: 2020-09-22

## 2020-07-13 MED ORDER — ATORVASTATIN CALCIUM 10 MG PO TABS: 10.0000 mg | ORAL_TABLET | Freq: Every day | ORAL | 1 refills | Status: DC

## 2020-07-13 NOTE — Telephone Encounter (Signed)
Do you know if patients information was submitted to [facility] for CPM?

## 2020-07-13 NOTE — Telephone Encounter (Signed)
Will fax once available.  

## 2020-07-13 NOTE — Telephone Encounter (Signed)
Luz Brazen is Mr Sangiovanni case Production designer, theatre/television/film.  She would like the office notes form today's visit.  Fax # [phone] Email [email or web address]

## 2020-07-13 NOTE — Progress Notes (Signed)
Subjective:    C[facility] - 60 y.o. male MRN [id number]  Date of birth: 1960-10-24  HPI  C[facility] is here for follow up of chronic medical conditions.  Diabetes mellitus, Type 2 Disease Monitoring             Blood Sugar Ranges: Fasting - 130s-140s             Polyuria: no             Visual problems: no   Urine Microalbumin <3 (Dec 2020) on Ace inhibitor   Last A1C: 10.5 (Oct 2021)   Medications: Jardiance 10 mg, Metformin 1000 mg BID   Medication Compliance: yes  Medication Side Effects             Hypoglycemia: no   Chronic HTN Disease Monitoring:  Home BP Monitoring - Has one but has never used it  Chest pain- no  Dyspnea- no Headache - no  Medications: Lisinopril 10 mg, HCTZ 12.5 mg  Compliance- yes Lightheadedness- no  Edema- no   Health Maintenance:  Health Maintenance Due  Topic Date Due  . COVID-19 Vaccine (1) Never done  . OPHTHALMOLOGY EXAM  Never done  . HIV Screening  Never done  . COLONOSCOPY (Pts 45-1yrs Insurance coverage will need to be confirmed)  Never done  . INFLUENZA VACCINE  Never done    -  reports that he has been smoking cigarettes. He has a 7.50 pack-year smoking history. He has never used smokeless tobacco. - Review of Systems: Per HPI. - Past Medical History: Patient Active Problem List   Diagnosis Date Noted  . Acute pain of left shoulder 05/19/2020  . Tobacco dependence 03/26/2019  . Essential hypertension 03/26/2019  . Type 2 diabetes mellitus without complication, without long-term current use of insulin (HCC) 03/26/2019  . Aneurysm, cerebral, nonruptured 03/26/2019   - Medications: reviewed and updated   Objective:   Physical Exam BP 128/88   Pulse 73   Resp 16   Wt 193 lb 3.2 oz (87.6 kg)   SpO2 98%   BMI 26.95 kg/m  Physical Exam Constitutional:      General: He is not in acute distress.    Appearance: He is not diaphoretic.  HENT:     Head: Normocephalic and atraumatic.   Eyes:     Conjunctiva/sclera: Conjunctivae normal.  Cardiovascular:     Rate and Rhythm: Normal rate and regular rhythm.     Heart sounds: Normal heart sounds. No murmur heard.   Pulmonary:     Effort: Pulmonary effort is normal. No respiratory distress.     Breath sounds: Normal breath sounds.  Musculoskeletal:        General: Normal range of motion.  Skin:    General: Skin is warm and dry.  Neurological:     Mental Status: He is alert and oriented to person, place, and time.  Psychiatric:        Mood and Affect: Affect normal.        Judgment: Judgment normal.            Assessment & Plan:   1. Type 2 diabetes mellitus without complication, without long-term current use of insulin (HCC) A1c significantly improved from >10 to 7.2. Near goal. Encouraged continued compliance and dietary adherence. Monitor in 3 months.  Counseled on Diabetic diet, my plate method, 259 minutes of moderate intensity exercise/week Blood sugar logs with fasting goals of 80-120 mg/dl, random of less than 563 and in  the event of sugars less than 60 mg/dl or greater than 993 mg/dl encouraged to notify the clinic. Advised on the need for annual eye exams, annual foot exams, Pneumonia vaccine. - Microalbumin / creatinine urine ratio - HgB A1c - POCT glucose (manual entry) - empagliflozin (JARDIANCE) 10 MG TABS tablet; Take 1 tablet (10 mg total) by mouth daily before breakfast.  Dispense: 90 tablet; Refill: 1 - metFORMIN (GLUCOPHAGE) 1000 MG tablet; Take 1 tablet (1,000 mg total) by mouth 2 (two) times daily with a meal.  Dispense: 180 tablet; Refill: 3  2. Essential hypertension BP well controlled. Asymptomatic. Continue current regimen.  - lisinopril-hydrochlorothiazide (ZESTORETIC) 10-12.5 MG tablet; Take 1 tablet by mouth daily.  Dispense: 90 tablet; Refill: 1  3. Hyperlipidemia, unspecified hyperlipidemia type - atorvastatin (LIPITOR) 10 MG tablet; Take 1 tablet (10 mg total) by mouth daily.   Dispense: 90 tablet; Refill: 1 - Lipid panel  Marcy Siren, D.O. 07/13/2020, 10:58 AM Primary Care at [facility]

## 2020-07-14 ENCOUNTER — Telehealth: Payer: Self-pay

## 2020-07-14 ENCOUNTER — Other Ambulatory Visit: Payer: Self-pay | Admitting: Surgical

## 2020-07-14 ENCOUNTER — Telehealth: Payer: Self-pay | Admitting: Orthopedic Surgery

## 2020-07-14 LAB — LIPID PANEL
Chol/HDL Ratio: 3.7 ratio (ref 0.0–5.0)
Cholesterol, Total: 111 mg/dL (ref 100–199)
HDL: 30 mg/dL — ABNORMAL LOW (ref >39)
LDL Chol Calc (NIH): 57 mg/dL (ref 0–99)
Triglycerides: 134 mg/dL (ref 0–149)
VLDL Cholesterol Cal: 24 mg/dL (ref 5–40)

## 2020-07-14 LAB — MICROALBUMIN / CREATININE URINE RATIO
Creatinine, Urine: 123.5 mg/dL
Microalb/Creat Ratio: <2 mg/g creat (ref 0–29)
Microalbumin, Urine: <3 ug/mL

## 2020-07-14 MED ORDER — OXYCODONE-ACETAMINOPHEN 5-325 MG PO TABS: 1.0000 | ORAL_TABLET | ORAL | 0 refills | Status: DC | PRN

## 2020-07-14 MED ORDER — OXYCODONE-ACETAMINOPHEN 10-325 MG PO TABS: 1.0000 | ORAL_TABLET | Freq: Four times a day (QID) | ORAL | 0 refills | Status: DC | PRN

## 2020-07-14 NOTE — Telephone Encounter (Signed)
I s/w patient and advised [facility] had sent in correct medication.

## 2020-07-14 NOTE — Telephone Encounter (Signed)
Rose patients case worker is requesting a return back to work note so patients case is updated and she is also requesting clinical notes from yesterdays visit call back:[phone]  Fax:[phone]

## 2020-07-14 NOTE — Telephone Encounter (Signed)
Out of physical lifting work for 3 months

## 2020-07-14 NOTE — Telephone Encounter (Signed)
Note faxed. Still waiting on OV note

## 2020-07-14 NOTE — Telephone Encounter (Signed)
Patient called requesting a call back from [employer]. Patient states Dr. [name] stated he would send pain medication and a machine for patient. Patient is unsure of the name of machine. Please call patient back concerning this matter and send prescriptions to pharmacy on file. Patient phone number is [phone].

## 2020-07-14 NOTE — Telephone Encounter (Signed)
Sent in refill

## 2020-07-14 NOTE — Telephone Encounter (Signed)
I am working on the CPM. Patient stated that he was told yesterday he would get refill on medication. Please advise.

## 2020-07-14 NOTE — Telephone Encounter (Signed)
Advised patient. Also advised working on status of CPM

## 2020-07-14 NOTE — Telephone Encounter (Signed)
Pt called back stating he spoke with someone earlier and was told Franky Macho had fixed the rx for 10 mg however the pharmacy didn't get anything. Pt wants to make sure that Franky Macho did intend to send the 10 mg because he says the 5 mg is like taking baby asprin. Pt would like a CB to further discuss this  [phone]

## 2020-07-14 NOTE — Telephone Encounter (Signed)
CPM order was sent to [facility] this morning

## 2020-07-14 NOTE — Telephone Encounter (Signed)
How long should patient remain out of work?

## 2020-07-14 NOTE — Telephone Encounter (Signed)
Pt called stating our office sent in the percocet 5-325 mg instead of the 10-325 mg. Pt would like to have this fixed and he would like to be notified when this is done.   [phone]

## 2020-07-14 NOTE — Telephone Encounter (Signed)
Please see below and advise.

## 2020-07-14 NOTE — Telephone Encounter (Signed)
I will submit one more RX of the 10mg  but after that we need to taper down to 5mg 

## 2020-07-15 NOTE — Telephone Encounter (Signed)
Patient aware, not happy; but aware of the below message from [facility]

## 2020-07-16 NOTE — Telephone Encounter (Signed)
Still pending

## 2020-07-17 ENCOUNTER — Encounter: Payer: Self-pay | Admitting: *Deleted

## 2020-07-19 ENCOUNTER — Encounter: Payer: Self-pay | Admitting: Orthopedic Surgery

## 2020-07-19 NOTE — Progress Notes (Signed)
   Post-Op Visit Note   Patient: Curtis [facility]           Date of Birth: [birth year]           MRN: [id number] Visit Date: 07/13/2020 PCP: [name], DO   Assessment & Plan:  Chief Complaint:  Chief Complaint  Patient presents with  . Left Shoulder - Routine Post Op   Visit Diagnoses:  1. Traumatic complete tear of left rotator cuff, subsequent encounter     Plan: Patient is a 60 year old male who presents s/p left shoulder arthroscopy with mini open rotator cuff repair on 07/06/2020.  He complains of severe pain that he localizes to the lateral and posterior aspect of the left shoulder.  He has run out of pain medication.  He does not have a CPM machine and never received a call from the device company.  Plan to arrange for patient to have a CPM machine.  On his exam, incisions are healing well and sutures were removed and replaced with Steri-Strips.  Axillary nerve is intact with deltoid firing.  Patient had significant pain with passive motion so plan to have patient use CPM machine and refill pain medication.  Follow-up in 2 weeks for clinical recheck for his range of motion.  Likely initiate outpatient therapy at that time or 1 week after that day.  Follow-Up Instructions: No follow-ups on file.   Orders:  No orders of the defined types were placed in this encounter.  No orders of the defined types were placed in this encounter.   Imaging: No results found.  PMFS History: Patient Active Problem List   Diagnosis Date Noted  . Acute pain of left shoulder 05/19/2020  . Tobacco dependence 03/26/2019  . Essential hypertension 03/26/2019  . Type 2 diabetes mellitus without complication, without long-term current use of insulin (HCC) 03/26/2019  . Aneurysm, cerebral, nonruptured 03/26/2019   Past Medical History:  Diagnosis Date  . Chronic kidney disease    only has one kidney  . Diabetes mellitus without complication (HCC)   . Essential hypertension    . Hyperlipidemia   . Hypertension    Phreesia 01/13/2020    Family History  Problem Relation Age of Onset  . Hypertension Mother   . Kidney failure Mother   . Kidney disease Mother   . Heart disease Mother   . ADD / ADHD Daughter   . Anxiety disorder Daughter   . Alcohol abuse Father   . Colon cancer Neg Hx   . Stomach cancer Neg Hx   . Esophageal cancer Neg Hx   . Colon polyps Neg Hx     Past Surgical History:  Procedure Laterality Date  . KIDNEY DONATION    . MANDIBLE FRACTURE SURGERY    . ROTATOR CUFF REPAIR Right    Social History   Occupational History  . Occupation: Truck [employer] doctor  Tobacco Use  . Smoking status: Current Every Day Smoker    Packs/day: 0.25    Years: 30.00    Pack years: 7.50    Types: Cigarettes  . Smokeless tobacco: Never Used  . Tobacco comment: off and on, has tried to quit  Vaping Use  . Vaping Use: Never used  Substance and Sexual Activity  . Alcohol use: Yes    Comment: rarely  . Drug use: Never  . Sexual activity: Not on file

## 2020-07-20 NOTE — Telephone Encounter (Signed)
faxed

## 2020-07-27 ENCOUNTER — Other Ambulatory Visit: Payer: Self-pay

## 2020-07-27 ENCOUNTER — Telehealth: Payer: Self-pay

## 2020-07-27 ENCOUNTER — Ambulatory Visit (INDEPENDENT_AMBULATORY_CARE_PROVIDER_SITE_OTHER): Payer: Worker's Compensation | Admitting: Orthopedic Surgery

## 2020-07-27 DIAGNOSIS — S46012D Strain of muscle(s) and tendon(s) of the rotator cuff of left shoulder, subsequent encounter: Secondary | ICD-10-CM

## 2020-07-27 MED ORDER — OXYCODONE-ACETAMINOPHEN 5-325 MG PO TABS: ORAL_TABLET | ORAL | 0 refills | Status: DC

## 2020-07-27 NOTE — Telephone Encounter (Signed)
Patient was seen in our office he is requesting a renewal for his handicap placard call [phone]

## 2020-07-27 NOTE — Telephone Encounter (Signed)
Please advise on this. Saw Dr. [name] today for his shoulder.

## 2020-07-28 ENCOUNTER — Telehealth: Payer: Self-pay

## 2020-07-28 NOTE — Telephone Encounter (Signed)
Please advise 

## 2020-07-28 NOTE — Telephone Encounter (Signed)
I called the patient - mailing the 6 months temporary handicap placard application to his home address, per request.

## 2020-07-28 NOTE — Telephone Encounter (Signed)
Patient states [name] told him since the 5 wasn't working he would give him 10?

## 2020-07-28 NOTE — Telephone Encounter (Signed)
Too far out from surgery to be still taking 10mg , need to transition to 5mg 

## 2020-07-28 NOTE — Telephone Encounter (Signed)
Printed

## 2020-07-28 NOTE — Telephone Encounter (Signed)
Patient called he stated the wrong rx was sent to the pharmacy patient stated he was supposed to get 10 mg oxycodone instead he received 5 mg call back:[phone]

## 2020-07-30 ENCOUNTER — Telehealth: Payer: Self-pay

## 2020-07-30 ENCOUNTER — Encounter: Payer: Self-pay | Admitting: Orthopedic Surgery

## 2020-07-30 NOTE — Telephone Encounter (Signed)
Must stick with 5, okay to supplement with OTC medications and ice/heat

## 2020-07-30 NOTE — Progress Notes (Signed)
Post-Op Visit Note   Patient: Curtis Williamson           Date of Birth: [birth year]           MRN: [id number] Visit Date: 07/27/2020 PCP: Curtis Market, DO   Assessment & Plan:  Chief Complaint:  Chief Complaint  Patient presents with  . Left Shoulder - Routine Post Op   Visit Diagnoses:  1. Traumatic complete tear of left rotator cuff, subsequent encounter     Plan: [name] is a 60 year old patient underwent left shoulder arthroscopy with mini open rotator cuff repair 07/06/2020.  He states he still in a lot of pain and hard for him to sleep.  On examination he has pretty good passive range of motion for 3 weeks postop.  Rotator cuff strength feels good and there is no coarseness with passive range of motion.  I am going to refill his Percocet.  3-week return.  Physical therapy to start today 3 times a week for 6 weeks with active range of motion active assisted range of motion and passive range of motion for the first 3 weeks and then okay to begin strengthening thereafter.  Out of work at least 3 more weeks and likely longer.  Discontinue sling but no lifting with left arm.  I do want him to continue with the CPM machine to improve his passive range of motion.  Follow-Up Instructions: Return in about 3 weeks (around 08/17/2020).   Orders:  Orders Placed This Encounter  Procedures  . Ambulatory referral to Physical Therapy   Meds ordered this encounter  Medications  . oxyCODONE-acetaminophen (PERCOCET/ROXICET) 5-325 MG tablet    Sig: 1 po q 8 hrs prn pain    Dispense:  30 tablet    Refill:  0    Imaging: No results found.  PMFS History: Patient Active Problem List   Diagnosis Date Noted  . Acute pain of left shoulder 05/19/2020  . Tobacco dependence 03/26/2019  . Essential hypertension 03/26/2019  . Type 2 diabetes mellitus without complication, without long-term current use of insulin (HCC) 03/26/2019  . Aneurysm, cerebral, nonruptured 03/26/2019   Past  Medical History:  Diagnosis Date  . Chronic kidney disease    only has one kidney  . Diabetes mellitus without complication (HCC)   . Essential hypertension   . Hyperlipidemia   . Hypertension    Phreesia 01/13/2020    Family History  Problem Relation Age of Onset  . Hypertension Mother   . Kidney failure Mother   . Kidney disease Mother   . Heart disease Mother   . ADD / ADHD Daughter   . Anxiety disorder Daughter   . Alcohol abuse Father   . Colon cancer Neg Hx   . Stomach cancer Neg Hx   . Esophageal cancer Neg Hx   . Colon polyps Neg Hx     Past Surgical History:  Procedure Laterality Date  . KIDNEY DONATION    . MANDIBLE FRACTURE SURGERY    . ROTATOR CUFF REPAIR Right    Social History   Occupational History  . Occupation: Truck [employer] doctor  Tobacco Use  . Smoking status: Current Every Day Smoker    Packs/day: 0.25    Years: 30.00    Pack years: 7.50    Types: Cigarettes  . Smokeless tobacco: Never Used  . Tobacco comment: off and on, has tried to quit  Vaping Use  . Vaping Use: Never used  Substance and Sexual Activity  . Alcohol  use: Yes    Comment: rarely  . Drug use: Never  . Sexual activity: Not on file

## 2020-07-30 NOTE — Telephone Encounter (Signed)
Patient called regarding rx for 5mg  oxycodone I advised patient what luke stated, patient stated he is still in a lot of pain and the 5 mg is not helping call back:[phone]

## 2020-07-31 NOTE — Telephone Encounter (Signed)
I'm happy to prescribe nonopioid medications that will supplement the 5mg  oxycodone but increasing the dosage is not appropriate this far out from procedure

## 2020-08-03 ENCOUNTER — Other Ambulatory Visit: Payer: Self-pay | Admitting: Surgical

## 2020-08-03 MED ORDER — OXYCODONE-ACETAMINOPHEN 5-325 MG PO TABS: ORAL_TABLET | ORAL | 0 refills | Status: DC

## 2020-08-03 NOTE — Telephone Encounter (Signed)
Sent in refill. This RX must last until May

## 2020-08-03 NOTE — Telephone Encounter (Signed)
See below. Can you please help with this? Thanks.

## 2020-08-03 NOTE — Telephone Encounter (Signed)
We need to start tapering.  Okay to refill 40 for 2 weeks of the oxycodone 5 mg.  1 p.o. every 8-12 hours.  Pain

## 2020-08-03 NOTE — Telephone Encounter (Signed)
IC advised patient per below.

## 2020-08-03 NOTE — Telephone Encounter (Signed)
Patient called back. Stated he would like to talk with one of you regarding his management of post op pain. He said the 5mg  was not enough, has not been enough up to this point and did not feel like that his needs would change in the next few hours. I did explain to him that he had taken pain medications prior to surgery and that this could contribute to the difficulty in trying to manage his pain but he was insisting that one of you call him to further discuss.

## 2020-08-04 ENCOUNTER — Ambulatory Visit: Payer: Worker's Compensation | Admitting: Physical Therapy

## 2020-08-07 NOTE — Telephone Encounter (Signed)
See Leane Call note below.

## 2020-08-07 NOTE — Telephone Encounter (Signed)
Recommend discussion with Dr. [name]; i've had little luck explaining the need for weaning off opioids multiple times

## 2020-08-13 NOTE — Telephone Encounter (Signed)
I will see him on 5/4

## 2020-08-17 ENCOUNTER — Other Ambulatory Visit: Payer: Self-pay | Admitting: Internal Medicine

## 2020-08-19 ENCOUNTER — Other Ambulatory Visit: Payer: Self-pay

## 2020-08-19 ENCOUNTER — Ambulatory Visit (INDEPENDENT_AMBULATORY_CARE_PROVIDER_SITE_OTHER): Payer: Worker's Compensation | Admitting: Orthopedic Surgery

## 2020-08-19 DIAGNOSIS — R591 Generalized enlarged lymph nodes: Secondary | ICD-10-CM

## 2020-08-19 MED ORDER — ACETAMINOPHEN-CODEINE #3 300-30 MG PO TABS: 1.0000 | ORAL_TABLET | Freq: Two times a day (BID) | ORAL | 0 refills | Status: DC | PRN

## 2020-08-21 ENCOUNTER — Other Ambulatory Visit: Payer: Self-pay

## 2020-08-21 ENCOUNTER — Ambulatory Visit (INDEPENDENT_AMBULATORY_CARE_PROVIDER_SITE_OTHER): Payer: Worker's Compensation | Admitting: Physical Therapy

## 2020-08-21 ENCOUNTER — Encounter: Payer: Self-pay | Admitting: Physical Therapy

## 2020-08-21 DIAGNOSIS — M25512 Pain in left shoulder: Secondary | ICD-10-CM

## 2020-08-21 DIAGNOSIS — M25612 Stiffness of left shoulder, not elsewhere classified: Secondary | ICD-10-CM

## 2020-08-21 DIAGNOSIS — R600 Localized edema: Secondary | ICD-10-CM | POA: Diagnosis not present

## 2020-08-21 DIAGNOSIS — M6281 Muscle weakness (generalized): Secondary | ICD-10-CM

## 2020-08-21 NOTE — Patient Instructions (Signed)
Access Code: [email or web address] URL: [email or web address] Date: 08/21/2020 Prepared by: Ivery Quale  Exercises Seated Upper Trapezius Stretch - 2 x daily - 6 x weekly - 1 sets - 3 reps - 30 hold Circular Shoulder Pendulum with Table Support - 3 x daily - 7 x weekly - 2 sets - 10 reps Supine Shoulder Flexion Extension AAROM with Dowel - 3 x daily - 7 x weekly - 1 sets - 10 reps Supine Shoulder External Rotation in 45 Degrees Abduction AAROM with Dowel - 3 x daily - 7 x weekly - 1 sets - 10 reps Shoulder Scaption AAROM with Dowel - 3 x daily - 7 x weekly - 1 sets - 10 reps Standing Shoulder Internal Rotation Stretch with Towel - 3 x daily - 7 x weekly - 1 sets - 10 reps

## 2020-08-21 NOTE — Therapy (Signed)
[facility] [address] [place], [place], [address] Phone: [phone]   Fax:  [phone]  Physical Therapy Evaluation  Patient Details  Name: Curtis Williamson MRN: [id number] Date of Birth: 23-Mar-1961 Referring Provider (PT): [name], MD   Encounter Date: 08/21/2020   PT End of Session - 08/21/20 0943    Visit Number 1    Number of Visits 18    Date for PT Re-Evaluation 10/16/20    Authorization Type workers comp,[employer] is case Production designer, theatre/television/film. Rachelle Hora for PT in person 8 visits to start. phone is [phone]    Authorization - Visit Number 1    Authorization - Number of Visits 8    Progress Note Due on Visit 8    PT Start Time 0805    PT Stop Time 0845    PT Time Calculation (min) 40 min    Activity Tolerance Patient tolerated treatment well;Patient limited by pain    Behavior During Therapy [facility] for tasks assessed/performed           Past Medical History:  Diagnosis Date  . Chronic kidney disease    only has one kidney  . Diabetes mellitus without complication (HCC)   . Essential hypertension   . Hyperlipidemia   . Hypertension    Phreesia 01/13/2020    Past Surgical History:  Procedure Laterality Date  . KIDNEY DONATION    . MANDIBLE FRACTURE SURGERY    . ROTATOR CUFF REPAIR Right     There were no vitals filed for this visit.    Subjective Assessment - 08/21/20 0810    Subjective Curtis Williamson is a 60 year old patient underwent left shoulder arthroscopy with mini open rotator cuff repair 07/06/2020.  He states he still in a lot of pain and hard for him to sleep. MD wants 3 times per week for 6 weeks.    Pertinent History PMH: Rt RTC repair 07/06/20,CKD(only has one kidney, DM,HTN)    Limitations Lifting;House hold activities    Patient Stated Goals To improve the use of his L shoulder    Currently in Pain? Yes    Pain Score 4     Pain Location Shoulder    Pain Orientation Left    Pain Descriptors / Indicators Aching    likes a tight rubberband with too much tension   Pain Type Surgical pain    Pain Radiating Towards denies N/T "for the most part, was having some but not really anymore"    Pain Onset More than a month ago    Pain Frequency Intermittent    Aggravating Factors  lift his arm up, sleeping    Pain Relieving Factors pain meds, sometimes ice or heat    Pain Onset More than a month ago              [facility] PT Assessment - 08/21/20 0001      Assessment   Medical Diagnosis Lt RTC repair 07/06/20    Referring Provider (PT) [name], MD    Hand Dominance Right      Home Environment   Living Environment Private residence      Prior Function   Level of Independence Independent    Vocation Full time employment    Vocation Requirements truck drivier    Leisure cooking      Cognition   Overall Cognitive Status Within Functional Limits for tasks assessed      Observation/Other Assessments   Observations mild edema noted in Lt shoulder, incision site looks well  healing without signs of infeciton    Focus on Therapeutic Outcomes (FOTO)  2nd visit as arrives late      AROM   AROM Assessment Site Shoulder    Right/Left Shoulder Left    Left Shoulder Flexion 40 Degrees    Left Shoulder ABduction 60 Degrees    Left Shoulder Internal Rotation --   L4 behind back   Left Shoulder External Rotation --   to ear trying to move behind head     PROM   Left Shoulder Flexion 110 Degrees    Left Shoulder ABduction 90 Degrees    Left Shoulder Internal Rotation 35 Degrees    Left Shoulder External Rotation 20 Degrees      Strength   Overall Strength Comments 3- out of 5 overall Lt shoulder strength grossly           Objective measurements completed on examination: See above findings.       [facility] Adult PT Treatment/Exercise - 08/21/20 0001      Exercises   Exercises Shoulder      Modalities   Modalities Vasopneumatic      Vasopneumatic   Number Minutes Vasopneumatic  10 minutes     Vasopnuematic Location  Shoulder    Vasopneumatic Pressure Medium    Vasopneumatic Temperature  34      Manual Therapy   Manual therapy comments Lt shoulder GH mobs grade 2-3 inferior, distraction, A-P mobs. Lt shoulder PROM to tolerance all planes                  PT Education - 08/21/20 0943    Education Details HEP,POC    Person(s) Educated Patient    Methods Explanation;Demonstration;Verbal cues;Handout    Comprehension Verbalized understanding;Need further instruction            PT Short Term Goals - 08/21/20 0949      PT SHORT TERM GOAL #1   Title Pt will be Ind in an initial HEP    Time 4    Period Weeks    Status New    Target Date 09/18/20      PT SHORT TERM GOAL #2   Title Pt will voice understanding of messures to assist in pain reduction    Time 3    Period Weeks    Status New    Target Date 09/18/20      PT SHORT TERM GOAL #3   Title Patient will increase pain free shoulder AROM flexion and abduciton to 100 degrees    Time 4    Period Weeks    Status New    Target Date 09/18/20      PT SHORT TERM GOAL #4   Title -             PT Long Term Goals - 08/21/20 0950      PT LONG TERM GOAL #1   Title Pt will be Ind in a final HEP to maintain or progress achieved LOF    Time 8    Period Weeks    Status New    Target Date 10/16/20      PT LONG TERM GOAL #2   Title Increase L shoulder strength  to 4+-5/5 for improved functional 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    Baseline Gorssly 3-/5    Time 8    Period Weeks    Status New    Target Date 10/16/20  PT LONG TERM GOAL #3   Title Increase L shoulder ROM to functional levels to complete household activites and 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    Baseline See flowsheets    Time 8    Period Weeks    Status New    Target Date 09/18/20      PT LONG TERM GOAL #4   Title Pt's L shoulder will decreased to less than 3/10 c household activites and drivng a  truck.    Time 8    Period Weeks    Status New      PT LONG TERM GOAL #5   Title -                  Plan - 08/21/20 0945    Clinical Impression Statement Pt presents wtihe Lt shoulder pain, stiffness, and weakness S/P mini open RTC repair 07/06/20. He will benefit from skilled PT to address his funcitonal deficits. MD requesting 3 times per week for 6 weeks and currently has 8 approaved visits to start.    Personal Factors and Comorbidities Comorbidity 1;Profession    Comorbidities PMH: Rt RTC repair 07/06/20,CKD(only has one kidney, DM,HTN)    Examination-Activity Limitations Carry;Lift;Reach Overhead    Examination-Participation Restrictions Occupation    Stability/Clinical Decision Making Evolving/Moderate complexity    Clinical Decision Making Moderate    Rehab Potential Good    PT Frequency 3x / week   2-3   PT Duration 8 weeks   6-8   PT Treatment/Interventions ADLs/Self Care Home Management;Cryotherapy;Electrical Stimulation;Ultrasound;Traction;Moist Heat;Iontophoresis 4mg /ml Dexamethasone;Functional mobility training;Therapeutic activities;Therapeutic exercise;Balance training;Manual techniques;Patient/family education;Passive range of motion;Dry needling;Taping;Vasopneumatic Device;Joint Manipulations;Neuromuscular re-education    PT Next Visit Plan review and update HEP PRN. Focus on initial ROM first but is now 6 weeks post op so can begin AROM as tolerated    PT Home Exercise Plan Access Code:    Consulted and Agree with Plan of Care Patient           Patient will benefit from skilled therapeutic intervention in order to improve the following deficits and impairments:  Decreased strength,Pain,Impaired UE functional use,Decreased range of motion,Decreased activity tolerance,Decreased mobility,Increased edema,Increased muscle spasms  Visit Diagnosis: Acute pain of left shoulder  Stiffness of left shoulder, not elsewhere classified  Muscle weakness  (generalized)  Localized edema     Problem List Patient Active Problem List   Diagnosis Date Noted  . Acute pain of left shoulder 05/19/2020  . Tobacco dependence 03/26/2019  . Essential hypertension 03/26/2019  . Type 2 diabetes mellitus without complication, without long-term current use of insulin (HCC) 03/26/2019  . Aneurysm, cerebral, nonruptured 03/26/2019    14/11/2018 08/21/2020, 9:54 AM  [facility] [address] [place], [place], [place] Phone: [phone]   Fax:  [phone]  Name: Aarion Metzgar MRN: Margrett Rud Date of Birth: 04-Jul-1960

## 2020-08-22 ENCOUNTER — Encounter: Payer: Self-pay | Admitting: Orthopedic Surgery

## 2020-08-22 NOTE — Progress Notes (Signed)
Post-Op Visit Note   Patient: Curtis Williamson           Date of Birth: Apr 07, 1961           MRN: [id number] Visit Date: 08/19/2020 PCP: [name], DO   Assessment & Plan:  Chief Complaint:  Chief Complaint  Patient presents with  . Left Shoulder - Follow-up   Visit Diagnoses:  1. Lymphadenopathy     Plan: [name] is a patient who underwent left shoulder arthroscopy with mini open rotator cuff repair 07/06/2020.  Started therapy on Thursday.  Having some pain.  Is at 90 degrees on CPM machine.  He is having a cough and has supraclavicular lymphadenopathy on the left-hand side which is a new finding.  Feels like his throat is scratchy.  On exam his got pretty reasonable passive shoulder range of motion with no coarse grinding or crepitus.  Does have 1 x 1.5 cm supraclavicular lymph node present.  No warmth to the incision.  Plan at this time is to start weaning him off of his narcotics.  We will go to Tylenol 3 1 every 12 hours.  Needs chest CT to evaluate lungs and supraclavicular lymphadenopathy.  He is a smoker so lung cancer would be a worry at this time.  3-week return for clinical recheck on the shoulder.  Follow-Up Instructions: Return in about 3 weeks (around 09/09/2020).   Orders:  Orders Placed This Encounter  Procedures  . CT CHEST WO CONTRAST   Meds ordered this encounter  Medications  . acetaminophen-codeine (TYLENOL #3) 300-30 MG tablet    Sig: Take 1 tablet by mouth every 12 (twelve) hours as needed for moderate pain.    Dispense:  30 tablet    Refill:  0    Imaging: No results found.  PMFS History: Patient Active Problem List   Diagnosis Date Noted  . Acute pain of left shoulder 05/19/2020  . Tobacco dependence 03/26/2019  . Essential hypertension 03/26/2019  . Type 2 diabetes mellitus without complication, without long-term current use of insulin (HCC) 03/26/2019  . Aneurysm, cerebral, nonruptured 03/26/2019   Past Medical History:   Diagnosis Date  . Chronic kidney disease    only has one kidney  . Diabetes mellitus without complication (HCC)   . Essential hypertension   . Hyperlipidemia   . Hypertension    Phreesia 01/13/2020    Family History  Problem Relation Age of Onset  . Hypertension Mother   . Kidney failure Mother   . Kidney disease Mother   . Heart disease Mother   . ADD / ADHD Daughter   . Anxiety disorder Daughter   . Alcohol abuse Father   . Colon cancer Neg Hx   . Stomach cancer Neg Hx   . Esophageal cancer Neg Hx   . Colon polyps Neg Hx     Past Surgical History:  Procedure Laterality Date  . KIDNEY DONATION    . MANDIBLE FRACTURE SURGERY    . ROTATOR CUFF REPAIR Right    Social History   Occupational History  . Occupation: Truck Hospital doctor  Tobacco Use  . Smoking status: Current Every Day Smoker    Packs/day: 0.25    Years: 30.00    Pack years: 7.50    Types: Cigarettes  . Smokeless tobacco: Never Used  . Tobacco comment: off and on, has tried to quit  Vaping Use  . Vaping Use: Never used  Substance and Sexual Activity  . Alcohol use: Yes  Comment: rarely  . Drug use: Never  . Sexual activity: Not on file

## 2020-08-24 ENCOUNTER — Other Ambulatory Visit: Payer: Self-pay

## 2020-08-24 ENCOUNTER — Encounter: Payer: Self-pay | Admitting: Physical Therapy

## 2020-08-24 ENCOUNTER — Ambulatory Visit (INDEPENDENT_AMBULATORY_CARE_PROVIDER_SITE_OTHER): Payer: Worker's Compensation | Admitting: Physical Therapy

## 2020-08-24 DIAGNOSIS — M6281 Muscle weakness (generalized): Secondary | ICD-10-CM

## 2020-08-24 DIAGNOSIS — M25512 Pain in left shoulder: Secondary | ICD-10-CM

## 2020-08-24 DIAGNOSIS — R600 Localized edema: Secondary | ICD-10-CM | POA: Diagnosis not present

## 2020-08-24 DIAGNOSIS — M25612 Stiffness of left shoulder, not elsewhere classified: Secondary | ICD-10-CM

## 2020-08-24 NOTE — Therapy (Signed)
[facility] Therapy [address] [place], [place], [address] Phone: [phone]   Fax:  [phone]  Physical Therapy Treatment  Patient Details  Name: Curtis Williamson MRN: [id number] Date of Birth: 06-27-1960 Referring Provider (PT): [name], MD   Encounter Date: 08/24/2020   PT End of Session - 08/24/20 0809    Visit Number 2    Number of Visits 18    Date for PT Re-Evaluation 10/16/20    Authorization Type workers comp,[employer] is case Production designer, theatre/television/film. Rachelle Hora for PT in person 8 visits to start. phone is [phone]    Authorization - Visit Number 2    Authorization - Number of Visits 8    Progress Note Due on Visit 8    PT Start Time 0802    PT Stop Time 0848    PT Time Calculation (min) 46 min    Activity Tolerance Patient tolerated treatment well;Patient limited by pain    Behavior During Therapy [facility] for tasks assessed/performed           Past Medical History:  Diagnosis Date  . Chronic kidney disease    only has one kidney  . Diabetes mellitus without complication (HCC)   . Essential hypertension   . Hyperlipidemia   . Hypertension    Phreesia 01/13/2020    Past Surgical History:  Procedure Laterality Date  . KIDNEY DONATION    . MANDIBLE FRACTURE SURGERY    . ROTATOR CUFF REPAIR Right     There were no vitals filed for this visit.   Subjective Assessment - 08/24/20 0807    Subjective Pt arriving to therapy reporting 3/10 left shoulder pain. Pt reproting compliance in his HEP.    Pertinent History PMH: Rt RTC repair 07/06/20,CKD(only has one kidney, DM,HTN)    Limitations Lifting;House hold activities    Patient Stated Goals To improve the use of his L shoulder    Currently in Pain? Yes    Pain Location Shoulder    Pain Descriptors / Indicators Aching;Sore    Pain Type Surgical pain    Pain Onset More than a month ago                             [facility] Adult PT Treatment/Exercise - 08/24/20 0001       Exercises   Exercises Shoulder      Shoulder Exercises: Supine   External Rotation AAROM;Left;20 reps;Limitations    Flexion AAROM;Left;20 reps      Shoulder Exercises: Seated   Other Seated Exercises UE ranger: flexion, circles both directions x 1 minute each    Other Seated Exercises table slides ER x 15 holding 5 seconds      Shoulder Exercises: Pulleys   Flexion 2 minutes    Scaption 2 minutes      Modalities   Modalities Vasopneumatic      Vasopneumatic   Number Minutes Vasopneumatic  10 minutes    Vasopnuematic Location  Shoulder    Vasopneumatic Pressure Medium    Vasopneumatic Temperature  34      Manual Therapy   Manual therapy comments Lt shoulder GH mobs grade 2-3 inferior, distraction, A-P mobs. Lt shoulder PROM to tolerance all planes                    PT Short Term Goals - 08/24/20 0350      PT SHORT TERM GOAL #1   Title Pt will be  Ind in an initial HEP    Baseline 110 without pain    Status On-going      PT SHORT TERM GOAL #2   Title Pt will voice understanding of messures to assist in pain reduction    Status On-going      PT SHORT TERM GOAL #3   Title Patient will increase pain free shoulder AROM flexion and abduciton to 100 degrees    Baseline perfroming at home    Status On-going             PT Long Term Goals - 08/21/20 0950      PT LONG TERM GOAL #1   Title Pt will be Ind in a final HEP to maintain or progress achieved LOF    Time 8    Period Weeks    Status New    Target Date 10/16/20      PT LONG TERM GOAL #2   Title Increase L shoulder strength  to 4+-5/5 for improved functional 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    Baseline See flowsheets    Time 8     Period Weeks    Status New    Target Date 09/18/20      PT LONG TERM GOAL #4   Title Pt's L shoulder will decreased to less than 3/10 c household activites and drivng a truck.    Time 8    Period Weeks    Status New      PT LONG TERM GOAL #5   Title -                 Plan - 08/24/20 0810    Clinical Impression Statement Pt arriving today reporting 3/10 pain in his left shoulder after taking pain meds this morning. Pt tolerating AAROM and AROM exercises well today with some limitations due to pain. Limitations in ER, pt instructed to increase his passive stretching at home. Continue skilled PT POC.    Personal Factors and Comorbidities Comorbidity 1;Profession    Comorbidities PMH: Rt RTC repair 07/06/20,CKD(only has one kidney, DM,HTN)    Examination-Activity Limitations Carry;Lift;Reach Overhead    Examination-Participation Restrictions Occupation    Stability/Clinical Decision Making Evolving/Moderate complexity    Rehab Potential Good    PT Frequency 3x / week    PT Duration 8 weeks    PT Treatment/Interventions ADLs/Self Care Home Management;Cryotherapy;Electrical Stimulation;Ultrasound;Traction;Moist Heat;Iontophoresis 4mg /ml Dexamethasone;Functional mobility training;Therapeutic activities;Therapeutic exercise;Balance training;Manual techniques;Patient/family education;Passive range of motion;Dry needling;Taping;Vasopneumatic Device;Joint Manipulations;Neuromuscular re-education    PT Next Visit Plan Focus on initial ROM first but is now 6 weeks post op so can begin AROM as tolerated    PT Home Exercise Plan Access Code:    Consulted and Agree with Plan of Care Patient           Patient will benefit from skilled therapeutic intervention in order to improve the following deficits and impairments:  Decreased strength,Pain,Impaired UE functional use,Decreased range of motion,Decreased activity tolerance,Decreased mobility,Increased edema,Increased muscle  spasms  Visit Diagnosis: Acute pain of left shoulder  Stiffness of left shoulder, not elsewhere classified  Muscle weakness (  generalized)  Localized edema     Problem List Patient Active Problem List   Diagnosis Date Noted  . Acute pain of left shoulder 05/19/2020  . Tobacco dependence 03/26/2019  . Essential hypertension 03/26/2019  . Type 2 diabetes mellitus without complication, without long-term current use of insulin (HCC) 03/26/2019  . Aneurysm, cerebral, nonruptured 03/26/2019    Sharmon Leyden, PT, MPT 08/24/2020, 8:46 AM  [facility] Physical Therapy [address] [place], [place], [address] Phone: [phone]   Fax:  [phone]  Name: Curtis Williamson MRN: [id number] Date of Birth: [birth year]

## 2020-08-27 ENCOUNTER — Ambulatory Visit (INDEPENDENT_AMBULATORY_CARE_PROVIDER_SITE_OTHER): Payer: Worker's Compensation | Admitting: Rehabilitative and Restorative Service Providers"

## 2020-08-27 ENCOUNTER — Encounter: Payer: Self-pay | Admitting: Rehabilitative and Restorative Service Providers"

## 2020-08-27 ENCOUNTER — Other Ambulatory Visit: Payer: Self-pay

## 2020-08-27 DIAGNOSIS — M6281 Muscle weakness (generalized): Secondary | ICD-10-CM | POA: Diagnosis not present

## 2020-08-27 DIAGNOSIS — R600 Localized edema: Secondary | ICD-10-CM | POA: Diagnosis not present

## 2020-08-27 DIAGNOSIS — M25612 Stiffness of left shoulder, not elsewhere classified: Secondary | ICD-10-CM | POA: Diagnosis not present

## 2020-08-27 DIAGNOSIS — M25512 Pain in left shoulder: Secondary | ICD-10-CM

## 2020-08-27 NOTE — Therapy (Signed)
[facility] Physical Therapy [address] [place], [place], [address] Phone: [phone]   Fax:  [phone]  Physical Therapy Treatment  Patient Details  Name: Curtis Williamson MRN: [id number] Date of Birth: 1961/01/22 Referring Provider (PT): [name], MD   Encounter Date: 08/27/2020   PT End of Session - 08/27/20 1310    Visit Number 3    Number of Visits 18    Date for PT Re-Evaluation 10/16/20    Authorization Type workers comp,[employer] is case Production designer, theatre/television/film. Rachelle Hora for PT in person 8 visits to start. phone is [phone]    Authorization - Visit Number 3    Authorization - Number of Visits 8    Progress Note Due on Visit 8    PT Start Time 1309    PT Stop Time 1359    PT Time Calculation (min) 50 min    Activity Tolerance Patient tolerated treatment well    Behavior During Therapy WFL for tasks assessed/performed           Past Medical History:  Diagnosis Date  . Chronic kidney disease    only has one kidney  . Diabetes mellitus without complication (HCC)   . Essential hypertension   . Hyperlipidemia   . Hypertension    Phreesia 01/13/2020    Past Surgical History:  Procedure Laterality Date  . KIDNEY DONATION    . MANDIBLE FRACTURE SURGERY    . ROTATOR CUFF REPAIR Right     There were no vitals filed for this visit.   Subjective Assessment - 08/27/20 1312    Subjective Pt. indicated complaints constant at 4/10 or so today.    Pertinent History PMH: Rt RTC repair 07/06/20,CKD(only has one kidney, DM,HTN)    Limitations Lifting;House hold activities    Patient Stated Goals To improve the use of his L shoulder    Currently in Pain? Yes    Pain Score 4     Pain Location Shoulder    Pain Orientation Left    Pain Descriptors / Indicators Aching;Sore    Pain Type Surgical pain    Pain Onset More than a month ago    Pain Frequency Constant    Aggravating Factors  insidious constant complaint reported    Pain Relieving Factors pain  medicine helps some.                             [facility] Adult PT Treatment/Exercise - 08/27/20 0001      Shoulder Exercises: Supine   External Rotation 15 reps;Left;AAROM   15x c arm in approx. 20 deg abduction   Flexion AAROM;Both   2 x 10 1 lb bar   Other Supine Exercises one attempt at flexion unsupported(movement to approx. 20 degrees flexion)      Shoulder Exercises: Standing   Other Standing Exercises UE ranger flexion 2 x 10 Lt to tolerance      Shoulder Exercises: Pulleys   Flexion 2 minutes    Scaption 2 minutes      Shoulder Exercises: ROM/Strengthening   UBE (Upper Arm Bike) Lvl 2.5 3 mins fwd/back each way      Shoulder Exercises: Stretch   Other Shoulder Stretches Lt upper trap stretch 15 sec x 3      Vasopneumatic   Number Minutes Vasopneumatic  10 minutes    Vasopnuematic Location  Shoulder    Vasopneumatic Pressure Medium    Vasopneumatic Temperature  34  Manual Therapy   Manual therapy comments Lt glenohumeral g3 inferior jt mobs in flexion, scaption, posterior mobs in ER.  PROM each direction                    PT Short Term Goals - 08/24/20 6384      PT SHORT TERM GOAL #1   Title Pt will be Ind in an initial HEP    Baseline 110 without pain    Status On-going      PT SHORT TERM GOAL #2   Title Pt will voice understanding of messures to assist in pain reduction    Status On-going      PT SHORT TERM GOAL #3   Title Patient will increase pain free shoulder AROM flexion and abduciton to 100 degrees    Baseline perfroming at home    Status On-going             PT Long Term Goals - 08/21/20 0950      PT LONG TERM GOAL #1   Title Pt will be Ind in a final HEP to maintain or progress achieved LOF    Time 8    Period Weeks    Status New    Target Date 10/16/20      PT LONG TERM GOAL #2   Title Increase L shoulder strength  to 4+-5/5 for improved functional 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    Baseline See flowsheets    Time 8    Period Weeks    Status New    Target Date 09/18/20      PT LONG TERM GOAL #4   Title Pt's L shoulder will decreased to less than 3/10 c household activites and drivng a truck.    Time 8    Period Weeks    Status New      PT LONG TERM GOAL #5   Title -                 Plan - 08/27/20 1329    Clinical Impression Statement Assessment of capsular mobility in early to mid range revealed very mild impairments c pain most obvious limitation in Lt shoulder mobility c some muscle guarding noted.  ER limitation showed more involvement from capsular tightness but noted pain end range as well.  Continued skilled PT services indicated to progress from passive to active assisted to active movements of Lt shoulder.    Personal Factors and Comorbidities Comorbidity 1;Profession    Comorbidities PMH: Rt RTC repair 07/06/20,CKD(only has one kidney, DM,HTN)    Examination-Activity Limitations Carry;Lift;Reach Overhead    Examination-Participation Restrictions Occupation    Stability/Clinical Decision Making Evolving/Moderate complexity    Rehab Potential Good    PT Frequency 3x / week    PT Duration 8 weeks    PT Treatment/Interventions ADLs/Self Care Home Management;Cryotherapy;Electrical Stimulation;Ultrasound;Traction;Moist Heat;Iontophoresis 4mg /ml Dexamethasone;Functional mobility training;Therapeutic activities;Therapeutic exercise;Balance training;Manual techniques;Patient/family education;Passive range of motion;Dry needling;Taping;Vasopneumatic Device;Joint Manipulations;Neuromuscular re-education    PT Next Visit Plan Conitnued passive to active assisted range in all directions c inclusion to active  mobility as tolerated in  gravity reduced positioning due to weakness/symptoms/shrug    PT Home Exercise Plan Access Code: [email or web address]    Consulted and Agree with Plan of Care Patient           Patient will benefit from skilled therapeutic intervention in order to improve the following deficits and impairments:  Decreased strength,Pain,Impaired UE functional use,Decreased range of motion,Decreased activity tolerance,Decreased mobility,Increased edema,Increased muscle spasms  Visit Diagnosis: Acute pain of left shoulder  Stiffness of left shoulder, not elsewhere classified  Muscle weakness (generalized)  Localized edema     Problem List Patient Active Problem List   Diagnosis Date Noted  . Acute pain of left shoulder 05/19/2020  . Tobacco dependence 03/26/2019  . Essential hypertension 03/26/2019  . Type 2 diabetes mellitus without complication, without long-term current use of insulin (HCC) 03/26/2019  . Aneurysm, cerebral, nonruptured 03/26/2019   Chyrel Masson, PT, DPT, OCS, ATC 08/27/20  1:46 PM    [facility] Physical Therapy [address] [place], [place], [address] Phone: [phone]   Fax:  [phone]  Name: Raphel Stickles MRN: [id number] Date of Birth: June 22, 1960

## 2020-09-01 ENCOUNTER — Encounter: Payer: Self-pay | Admitting: Physical Therapy

## 2020-09-01 ENCOUNTER — Other Ambulatory Visit: Payer: Self-pay

## 2020-09-01 ENCOUNTER — Ambulatory Visit (INDEPENDENT_AMBULATORY_CARE_PROVIDER_SITE_OTHER): Payer: Worker's Compensation | Admitting: Physical Therapy

## 2020-09-01 DIAGNOSIS — M6281 Muscle weakness (generalized): Secondary | ICD-10-CM | POA: Diagnosis not present

## 2020-09-01 DIAGNOSIS — M25612 Stiffness of left shoulder, not elsewhere classified: Secondary | ICD-10-CM | POA: Diagnosis not present

## 2020-09-01 DIAGNOSIS — M25512 Pain in left shoulder: Secondary | ICD-10-CM

## 2020-09-01 NOTE — Therapy (Signed)
[facility] Physical Therapy [address] [place], [place], [address] Phone: [phone]   Fax:  [phone]  Physical Therapy Treatment  Patient Details  Name: Curtis Williamson MRN: [id number] Date of Birth: [birth year] Referring Provider (PT): [name], MD   Encounter Date: 09/01/2020   PT End of Session - 09/01/20 0850    Visit Number 4    Number of Visits 18    Date for PT Re-Evaluation 10/16/20    Authorization Type workers comp,[employer] is case Production designer, theatre/television/film. Rachelle Hora for PT in person 8 visits to start. phone is [phone]    Authorization - Visit Number 4    Authorization - Number of Visits 8    Progress Note Due on Visit 8    PT Start Time 0845    PT Stop Time 0930    PT Time Calculation (min) 45 min    Activity Tolerance Patient tolerated treatment well    Behavior During Therapy [facility] for tasks assessed/performed           Past Medical History:  Diagnosis Date  . Chronic kidney disease    only has one kidney  . Diabetes mellitus without complication (HCC)   . Essential hypertension   . Hyperlipidemia   . Hypertension    Phreesia 01/13/2020    Past Surgical History:  Procedure Laterality Date  . KIDNEY DONATION    . MANDIBLE FRACTURE SURGERY    . ROTATOR CUFF REPAIR Right     There were no vitals filed for this visit.   Subjective Assessment - 09/01/20 0847    Subjective Pt states that his shoulder is feeling looser. As long as he is consistent with the medication, the shoulder will feel good and he is able to work through [employer]. He states that the shoulder is less painful than before and no trouble with HEP.    Pertinent History PMH: Rt RTC repair 07/06/20,CKD(only has one kidney, DM,HTN)    Limitations Lifting;House hold activities    Patient Stated Goals To improve the use of his L shoulder    Currently in Pain? Yes    Pain Score 2     Pain Orientation Left    Pain Type Surgical pain    Pain Onset More than a month ago                              [facility] Adult PT Treatment/Exercise - 09/01/20 0001      Shoulder Exercises: Supine   External Rotation 15 reps;Left;AAROM   15x c arm in approx. 20 deg abduction   Flexion AAROM;Both   2 x 10 1 lb bar   Other Supine Exercises --      Shoulder Exercises: Seated   Retraction 20 reps VC and TC for reduced elevation     Shoulder Exercises: Standing   Other Standing Exercises UE ranger flexion 2 x 10 Lt to tolerance      Shoulder Exercises: Pulleys   Flexion --    Scaption --      Shoulder Exercises: ROM/Strengthening   UBE (Upper Arm Bike) Lvl 2.5 3 mins fwd/back each way      Shoulder Exercises: Stretch   Other Shoulder Stretches Lt upper trap stretch 15 sec x 3    Other Shoulder Stretches Levator stretch 30s 3x      Vasopneumatic   Number Minutes Vasopneumatic  10 minutes    Vasopnuematic Location  Shoulder  Vasopneumatic Pressure Medium    Vasopneumatic Temperature  34      Manual Therapy   Manual therapy comments Lt GH grade III inferiormobs in flexion, scaption, posterior mobs in ER                  PT Education - 09/01/20 0850    Education Details anatomy, exercise progression, DOMS expectations, muscle firing, HEP, POC    Person(s) Educated Patient    Methods Explanation;Demonstration;Tactile cues;Verbal cues    Comprehension Verbalized understanding;Returned demonstration;Verbal cues required;Tactile cues required            PT Short Term Goals - 08/24/20 0812      PT SHORT TERM GOAL #1   Title Pt will be Ind in an initial HEP    Baseline 110 without pain    Status On-going      PT SHORT TERM GOAL #2   Title Pt will voice understanding of messures to assist in pain reduction    Status On-going      PT SHORT TERM GOAL #3   Title Patient will increase pain free shoulder AROM flexion and abduciton to 100 degrees    Baseline perfroming at home    Status On-going             PT Long Term Goals - 08/21/20  0950      PT LONG TERM GOAL #1   Title Pt will be Ind in a final HEP to maintain or progress achieved LOF    Time 8    Period Weeks    Status New    Target Date 10/16/20      PT LONG TERM GOAL #2   Title Increase L shoulder strength  to 4+-5/5 for improved functional 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    Baseline See flowsheets    Time 8    Period Weeks    Status New    Target Date 09/18/20      PT LONG TERM GOAL #4   Title Pt's L shoulder will decreased to less than 3/10 c household activites and drivng a truck.    Time 8    Period Weeks    Status New      PT LONG TERM GOAL #5   Title -                 Plan - 09/01/20 0850    Clinical Impression Statement Pt presented with decreased flexion and ABD ROM at beginning of session but was able to improve by >20 degrees in each direction after manual therapy and exercise. Pt continues to the GHJ capsule restriction in all directions with anterior being the greatest. UT and levator stretching relieved upper quarter pain. Pt was able to improve ER with AAROM but had increased pain in open pack position. Plan to progress ROM as tolerated. May trial table ER stretch next session as [place] ER is more irritable. Pt would benefit from continued skilled therapy in order to reach goals and maximize functional L UE strength and ROM for full return to PLOF.    Personal Factors  and Comorbidities Comorbidity 1;Profession    Comorbidities PMH: Rt RTC repair 07/06/20,CKD(only has one kidney, DM,HTN)    Examination-Activity Limitations Carry;Lift;Reach Overhead    Examination-Participation Restrictions Occupation    Stability/Clinical Decision Making Evolving/Moderate complexity     Rehab Potential Good    PT Frequency 3x / week    PT Duration 8 weeks    PT Treatment/Interventions ADLs/Self Care Home Management;Cryotherapy;Electrical Stimulation;Ultrasound;Traction;Moist Heat;Iontophoresis 4mg /ml Dexamethasone;Functional mobility training;Therapeutic activities;Therapeutic exercise;Balance training;Manual techniques;Patient/family education;Passive range of motion;Dry needling;Taping;Vasopneumatic Device;Joint Manipulations;Neuromuscular re-education    PT Next Visit Plan table ER, table flexion, standing cane scaption AAROM    PT Home Exercise Plan Access Code:    Consulted and Agree with Plan of Care Patient           Patient will benefit from skilled therapeutic intervention in order to improve the following deficits and impairments:  Decreased strength,Pain,Impaired UE functional use,Decreased range of motion,Decreased activity tolerance,Decreased mobility,Increased edema,Increased muscle spasms  Visit Diagnosis: Acute pain of left shoulder  Stiffness of left shoulder, not elsewhere classified  Muscle weakness (generalized)     Problem List Patient Active Problem List   Diagnosis Date Noted  . Acute pain of left shoulder 05/19/2020  . Tobacco dependence 03/26/2019  . Essential hypertension 03/26/2019  . Type 2 diabetes mellitus without complication, without long-term current use of insulin (HCC) 03/26/2019  . Aneurysm, cerebral, nonruptured 03/26/2019   14/11/2018 PT, DPT 09/01/20 9:28 AM   [facility] Physical Therapy [address] [place], [place], [place] Phone: [phone]   Fax:  [phone]  Name: Royal Vandevoort MRN: Margrett Rud Date of Birth: [birth year]

## 2020-09-03 ENCOUNTER — Other Ambulatory Visit: Payer: Self-pay

## 2020-09-03 ENCOUNTER — Ambulatory Visit (INDEPENDENT_AMBULATORY_CARE_PROVIDER_SITE_OTHER): Payer: Worker's Compensation | Admitting: Rehabilitative and Restorative Service Providers"

## 2020-09-03 ENCOUNTER — Encounter: Payer: Self-pay | Admitting: Rehabilitative and Restorative Service Providers"

## 2020-09-03 DIAGNOSIS — R600 Localized edema: Secondary | ICD-10-CM | POA: Diagnosis not present

## 2020-09-03 DIAGNOSIS — M25512 Pain in left shoulder: Secondary | ICD-10-CM

## 2020-09-03 DIAGNOSIS — M25612 Stiffness of left shoulder, not elsewhere classified: Secondary | ICD-10-CM | POA: Diagnosis not present

## 2020-09-03 DIAGNOSIS — M6281 Muscle weakness (generalized): Secondary | ICD-10-CM | POA: Diagnosis not present

## 2020-09-03 NOTE — Therapy (Signed)
[facility] Physical Therapy [address] [place], [place], [address] Phone: [phone]   Fax:  [phone]  Physical Therapy Treatment  Patient Details  Name: Curtis Williamson MRN: [id number] Date of Birth: 1961-02-02 Referring Provider (PT): [name], MD   Encounter Date: 09/03/2020   PT End of Session - 09/03/20 0808    Visit Number 5    Number of Visits 18    Date for PT Re-Evaluation 10/16/20    Authorization Type workers comp,[employer] is case Production designer, theatre/television/film. Rachelle Hora for PT in person 8 visits to start. phone is [phone]    Authorization - Visit Number 5    Authorization - Number of Visits 8    Progress Note Due on Visit 8    PT Start Time 0802    PT Stop Time [phone]    PT Time Calculation (min) 50 min    Activity Tolerance Patient tolerated treatment well    Behavior During Therapy [employer] for tasks assessed/performed           Past Medical History:  Diagnosis Date  . Chronic kidney disease    only has one kidney  . Diabetes mellitus without complication (HCC)   . Essential hypertension   . Hyperlipidemia   . Hypertension    Phreesia 01/13/2020    Past Surgical History:  Procedure Laterality Date  . KIDNEY DONATION    . MANDIBLE FRACTURE SURGERY    . ROTATOR CUFF REPAIR Right     There were no vitals filed for this visit.   Subjective Assessment - 09/03/20 0810    Subjective Pt. indicated feeling similar overall complaints around 2-3 or so.    Pertinent History PMH: Rt RTC repair 07/06/20,CKD(only has one kidney, DM,HTN)    Limitations Lifting;House hold activities    Patient Stated Goals To improve the use of his L shoulder    Currently in Pain? Yes    Pain Score 2     Pain Location Shoulder    Pain Orientation Left    Pain Descriptors / Indicators Aching;Sore    Pain Type Surgical pain    Pain Onset More than a month ago    Pain Frequency Constant    Aggravating Factors  resting constant symptom    Pain Relieving Factors pain  medicine at times    Multiple Pain Sites No                             [facility] Adult PT Treatment/Exercise - 09/03/20 0001      Shoulder Exercises: Supine   Flexion AROM;Left   2x 20     Shoulder Exercises: Sidelying   External Rotation Left;AROM   2 x15   ABduction Left   2 x 15     Shoulder Exercises: Standing   Extension 20 reps;Both    Theraband Level (Shoulder Extension) Level 3 (Green)    Row Both   scapular retraction movement focus 2 sec hold   Theraband Level (Shoulder Row) Level 3 (Green)      Shoulder Exercises: ROM/Strengthening   UBE (Upper Arm Bike) LVl 3 3 mins fwd/back each way      Shoulder Exercises: Stretch   Other Shoulder Stretches Levator stretch, upper trap stretch in HEP prn      Vasopneumatic   Number Minutes Vasopneumatic  10 minutes    Vasopnuematic Location  Shoulder    Vasopneumatic Pressure Medium    Vasopneumatic Temperature  34  Manual Therapy   Manual therapy comments Lt glenohumeral g3 inferior jt mobs in flexion, scaption, posterior mobs in ER.  PROM each direction                  PT Education - 09/03/20 0831    Education Details Addition of active movement to HEP    Person(s) Educated Patient    Methods Explanation;Demonstration;Handout;Verbal cues    Comprehension Verbalized understanding;Returned demonstration            PT Short Term Goals - 09/03/20 0830      PT SHORT TERM GOAL #1   Title Pt will be Ind in an initial HEP    Status Achieved      PT SHORT TERM GOAL #2   Title Pt will voice understanding of messures to assist in pain reduction    Status On-going      PT SHORT TERM GOAL #3   Title Patient will increase pain free shoulder AROM flexion and abduciton to 100 degrees    Status Achieved             PT Long Term Goals - 08/21/20 0950      PT LONG TERM GOAL #1   Title Pt will be Ind in a final HEP to maintain or progress achieved LOF    Time 8    Period Weeks    Status  New    Target Date 10/16/20      PT LONG TERM GOAL #2   Title Increase L shoulder strength  to 4+-5/5 for improved functional 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    Baseline See flowsheets    Time 8    Period Weeks    Status New    Target Date 09/18/20      PT LONG TERM GOAL #4   Title Pt's L shoulder will decreased to less than 3/10 c household activites and drivng a truck.    Time 8    Period Weeks    Status New      PT LONG TERM GOAL #5   Title -                 Plan - 09/03/20 0828    Clinical Impression Statement Posterior capsule tightness more noted vs. inferior tightness, as evident in manual assessment as well as passive/active movement restrictions in ER/IR mobility.  Transitioning to include active movement in gravity reduced positioning important at this time to improve strength in functional movement pattern.    Personal Factors and Comorbidities Comorbidity 1;Profession    Comorbidities PMH: Rt RTC repair 07/06/20,CKD(only has one kidney, DM,HTN)    Examination-Activity Limitations Carry;Lift;Reach Overhead    Examination-Participation Restrictions Occupation    Stability/Clinical Decision Making Evolving/Moderate complexity    Rehab Potential Good    PT Frequency 3x / week    PT Duration 8 weeks    PT Treatment/Interventions ADLs/Self Care Home Management;Cryotherapy;Electrical Stimulation;Ultrasound;Traction;Moist Heat;Iontophoresis 4mg /ml Dexamethasone;Functional mobility training;Therapeutic activities;Therapeutic exercise;Balance training;Manual techniques;Patient/family education;Passive range of motion;Dry needling;Taping;Vasopneumatic Device;Joint Manipulations;Neuromuscular re-education     PT Next Visit Plan Continue c active movement in gravity  reduced positioning c addition of light resistance as tolerated.  Continue manual and ther ex to progress end range mobility in all directions, specifically ER.    PT Home Exercise Plan Access Code: [email or web address]    Consulted and Agree with Plan of Care Patient           Patient will benefit from skilled therapeutic intervention in order to improve the following deficits and impairments:  Decreased strength,Pain,Impaired UE functional use,Decreased range of motion,Decreased activity tolerance,Decreased mobility,Increased edema,Increased muscle spasms  Visit Diagnosis: Acute pain of left shoulder  Stiffness of left shoulder, not elsewhere classified  Muscle weakness (generalized)  Localized edema     Problem List Patient Active Problem List   Diagnosis Date Noted  . Acute pain of left shoulder 05/19/2020  . Tobacco dependence 03/26/2019  . Essential hypertension 03/26/2019  . Type 2 diabetes mellitus without complication, without long-term current use of insulin (HCC) 03/26/2019  . Aneurysm, cerebral, nonruptured 03/26/2019   Chyrel Masson, PT, DPT, OCS, ATC 09/03/20  8:41 AM    [facility] Physical Therapy [address] [place], [place], [address] Phone: [phone]   Fax:  [phone]  Name: Curtis Williamson MRN: [id number] Date of Birth: [birth year]

## 2020-09-03 NOTE — Patient Instructions (Signed)
Access Code: [email or web address] URL: [email or web address] Date: 09/03/2020 Prepared by: Chyrel Masson  Exercises Seated Upper Trapezius Stretch - 2 x daily - 6 x weekly - 1 sets - 3 reps - 30 hold Supine Shoulder Flexion Extension AAROM with Dowel - 3 x daily - 7 x weekly - 1 sets - 10 reps Supine Shoulder External Rotation in 45 Degrees Abduction AAROM with Dowel - 3 x daily - 7 x weekly - 1 sets - 10 reps Shoulder Scaption AAROM with Dowel - 3 x daily - 7 x weekly - 1 sets - 10 reps Standing Shoulder Internal Rotation Stretch with Towel - 3 x daily - 7 x weekly - 1 sets - 10 reps Standing Shoulder Row with Anchored Resistance - 2 x daily - 7 x weekly - 10 reps - 3 sets Shoulder Extension with Resistance - 2 x daily - 7 x weekly - 10 reps - 3 sets Supine Shoulder Flexion Extension Full Range AROM (Mirrored) - 2 x daily - 7 x weekly - 3 sets - 10-15 reps Sidelying Shoulder Abduction Palm Forward (Mirrored) - 2 x daily - 7 x weekly - 3 sets - 10-15 reps Sidelying Shoulder External Rotation - 2 x daily - 7 x weekly - 3 sets - 10-15 reps

## 2020-09-04 ENCOUNTER — Encounter: Payer: Worker's Compensation | Admitting: Rehabilitative and Restorative Service Providers"

## 2020-09-04 ENCOUNTER — Telehealth: Payer: Self-pay | Admitting: Rehabilitative and Restorative Service Providers"

## 2020-09-04 NOTE — Telephone Encounter (Signed)
Pt. Indicated he wasn't aware of today's appointment and he had scheduled an appointment for daughter for this time.  Reminded him of next appointment time.  Chyrel Masson, PT, DPT, OCS, ATC 09/04/20  8:18 AM

## 2020-09-07 ENCOUNTER — Ambulatory Visit (INDEPENDENT_AMBULATORY_CARE_PROVIDER_SITE_OTHER): Payer: Worker's Compensation | Admitting: Physical Therapy

## 2020-09-07 ENCOUNTER — Other Ambulatory Visit: Payer: Self-pay

## 2020-09-07 ENCOUNTER — Encounter: Payer: Self-pay | Admitting: Physical Therapy

## 2020-09-07 DIAGNOSIS — M25512 Pain in left shoulder: Secondary | ICD-10-CM | POA: Diagnosis not present

## 2020-09-07 DIAGNOSIS — M6281 Muscle weakness (generalized): Secondary | ICD-10-CM | POA: Diagnosis not present

## 2020-09-07 DIAGNOSIS — R600 Localized edema: Secondary | ICD-10-CM

## 2020-09-07 DIAGNOSIS — M25612 Stiffness of left shoulder, not elsewhere classified: Secondary | ICD-10-CM | POA: Diagnosis not present

## 2020-09-07 NOTE — Patient Instructions (Signed)
Access Code: [email or web address] URL: [email or web address] Date: 09/07/2020 Prepared by: Ivery Quale  Exercises Supine Shoulder Flexion Extension AAROM with Dowel - 3 x daily - 7 x weekly - 1 sets - 10 reps Supine Shoulder External Rotation in 45 Degrees Abduction AAROM with Dowel - 3 x daily - 7 x weekly - 1 sets - 10 reps Shoulder Scaption AAROM with Dowel - 3 x daily - 7 x weekly - 1 sets - 10 reps Seated Shoulder External Rotation PROM on Table - 2-3 x daily - 6 x weekly - 10 reps - 1-2 sets - 5 hold Shoulder Abduction - Thumbs Up - 2 x daily - 6 x weekly - 3 sets - 10 reps Standing Shoulder Flexion to 90 Degrees - 2 x daily - 6 x weekly - 3 sets - 10 reps Standing Shoulder Row with Anchored Resistance - 2 x daily - 7 x weekly - 10 reps - 3 sets Shoulder Extension with Resistance - 2 x daily - 7 x weekly - 10 reps - 3 sets Shoulder External Rotation with Anchored Resistance - 2 x daily - 6 x weekly - 10 reps - 2-3 sets Standing Shoulder Internal Rotation with Anchored Resistance - 2 x daily - 6 x weekly - 3 sets - 10 reps

## 2020-09-07 NOTE — Therapy (Signed)
[facility] Physical Therapy [address] [place], [place], [address] Phone: [phone]   Fax:  [phone]  Physical Therapy Treatment/MD progress note  Patient Details  Name: Curtis Williamson MRN: [id number] Date of Birth: 08-Oct-1960 Referring Provider (PT): Marlou Sa, MD   Encounter Date: 09/07/2020   PT End of Session - 09/07/20 0907    Visit Number 6    Number of Visits 18    Date for PT Re-Evaluation 10/16/20    Authorization Type workers comp,[employer] is case Freight forwarder. Jearld Pies for PT in person 8 visits to start. phone is [phone]    Authorization - Visit Number 6    Authorization - Number of Visits 8    Progress Note Due on Visit 8    PT Start Time 0840    PT Stop Time 0930    PT Time Calculation (min) 50 min    Activity Tolerance Patient tolerated treatment well    Behavior During Therapy [facility] for tasks assessed/performed           Past Medical History:  Diagnosis Date  . Chronic kidney disease    only has one kidney  . Diabetes mellitus without complication ([place])   . Essential hypertension   . Hyperlipidemia   . Hypertension    Phreesia 01/13/2020    Past Surgical History:  Procedure Laterality Date  . KIDNEY DONATION    . MANDIBLE FRACTURE SURGERY    . ROTATOR CUFF REPAIR Right     There were no vitals filed for this visit.   Subjective Assessment - 09/07/20 0849    Subjective Pt. indicated feeling better overall with his shoulder, pain is 2/10 overall.    Pertinent History PMH: Rt RTC repair 07/06/20,CKD(only has one kidney, DM,HTN)    Limitations Lifting;House hold activities    Patient Stated Goals To improve the use of his L shoulder    Pain Onset More than a month ago    Pain Onset More than a month ago              [facility] PT Assessment - 09/07/20 0001      Assessment   Medical Diagnosis Lt RTC repair 07/06/20    Referring Provider (PT) Marlou Sa, MD    Next MD Visit 09/09/20      AROM   Left Shoulder Flexion  95 Degrees    Left Shoulder ABduction 90 Degrees    Left Shoulder Internal Rotation --   [facility]   Left Shoulder External Rotation --   to occiput reaching behind head     PROM   Left Shoulder Flexion 130 Degrees    Left Shoulder ABduction 125 Degrees   slight scaption   Left Shoulder Internal Rotation 60 Degrees    Left Shoulder External Rotation 40 Degrees      Strength   Strength Assessment Site Shoulder    Right/Left Shoulder Left    Left Shoulder Flexion 4/5    Left Shoulder ABduction 3+/5    Left Shoulder Internal Rotation 4+/5    Left Shoulder External Rotation 4/5            [facility] Adult PT Treatment/Exercise - 09/07/20 0001      Shoulder Exercises: Seated   Other Seated Exercises table stretch ER, and  abd 10 sec X10 ea      Shoulder Exercises: Standing   External Rotation Left;20 reps    Theraband Level (Shoulder External Rotation) Level 2 (Red)    Internal Rotation Left;20 reps  Theraband Level (Shoulder Internal Rotation) Level 2 (Red)    Flexion AROM;10 reps    ABduction AROM;10 reps    Extension 20 reps;Both    Theraband Level (Shoulder Extension) Level 3 (Green)    Row Both;20 reps    Theraband Level (Shoulder Row) Level 3 (Green)      Shoulder Exercises: Pulleys   Flexion 2 minutes    ABduction 2 minutes      Shoulder Exercises: ROM/Strengthening   UBE (Upper Arm Bike) LVl 3 3 mins fwd/back each way      Vasopneumatic   Number Minutes Vasopneumatic  10 minutes    Vasopnuematic Location  Shoulder    Vasopneumatic Pressure Medium    Vasopneumatic Temperature  34      Manual Therapy   Manual therapy comments Lt glenohumeral g3 inferior jt mobs in flexion, scaption, posterior mobs in ER.  PROM each direction                    PT Short Term Goals - 09/07/20 0925      PT SHORT TERM GOAL #1   Title Pt will be Ind in an initial HEP    Status Achieved      PT SHORT TERM GOAL #2   Title Pt will voice understanding of messures to assist in  pain reduction    Status Achieved      PT SHORT TERM GOAL #3   Title Patient will increase pain free shoulder AROM flexion and abduciton to 100 degrees    Baseline 95 deg now    Status Partially Met             PT Long Term Goals - 09/07/20 0925      PT LONG TERM GOAL #1   Title Pt will be Ind in a final HEP to maintain or progress achieved LOF    Time 8    Period Weeks    Status On-going      PT LONG TERM GOAL #2   Title Increase L shoulder strength  to 4+-5/5 for improved functional 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    Baseline Gorssly 3-/5    Time 8    Period Weeks    Status On-going      PT LONG TERM GOAL #3   Title Increase L shoulder ROM to functional levels to complete household activites and 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    Baseline See flowsheets    Time 8    Period Weeks    Status On-going      PT LONG TERM GOAL #4   Title Pt's L shoulder will decreased to less than 3/10 c household activites and drivng a truck.    Time 8    Period Weeks    Status On-going      PT LONG TERM GOAL #5   Title -                 Plan - 09/07/20 0915    Clinical Impression Statement MD progress note performed today which reflects he has made fair to good early progress with PT improving his Rt shoulder ROM and strength, see updated measurments. He does still have funcitonal deficits in shoulder ROM, strength, and reaching abilities/lifting abilities and will conitnue to benefit from skilled PT.    Personal Factors and Comorbidities Comorbidity 1;Profession    Comorbidities PMH: Rt RTC repair 07/06/20,CKD(only has one kidney, DM,HTN)  Examination-Activity Limitations Carry;Lift;Reach Overhead    Examination-Participation Restrictions Occupation    Stability/Clinical Decision Making Evolving/Moderate complexity    Rehab Potential Good    PT Frequency 3x / week    PT Duration 8 weeks    PT Treatment/Interventions ADLs/Self Care Home  Management;Cryotherapy;Electrical Stimulation;Ultrasound;Traction;Moist Heat;Iontophoresis 73m/ml Dexamethasone;Functional mobility training;Therapeutic activities;Therapeutic exercise;Balance training;Manual techniques;Patient/family education;Passive range of motion;Dry needling;Taping;Vasopneumatic Device;Joint Manipulations;Neuromuscular re-education    PT Next Visit Plan Continue wth ROM focus then light strength. Continue manual and ther ex to progress end range mobility in all directions, specifically ER.    PT Home Exercise Plan Access Code: [email or web address]   Consulted and Agree with Plan of Care Patient           Patient will benefit from skilled therapeutic intervention in order to improve the following deficits and impairments:  Decreased strength,Pain,Impaired UE functional use,Decreased range of motion,Decreased activity tolerance,Decreased mobility,Increased edema,Increased muscle spasms  Visit Diagnosis: Acute pain of left shoulder  Stiffness of left shoulder, not elsewhere classified  Muscle weakness (generalized)  Localized edema     Problem List Patient Active Problem List   Diagnosis Date Noted  . Acute pain of left shoulder 05/19/2020  . Tobacco dependence 03/26/2019  . Essential hypertension 03/26/2019  . Type 2 diabetes mellitus without complication, without long-term current use of insulin [place] 03/26/2019  . Aneurysm, cerebral, nonruptured 03/26/2019    BSilvestre Mesi5/23/2022, 9:28 AM  [facility] [address] [address]Phone: [phone]  Fax:  [phone] Name: CZade FalknerMRN: [id number] of Birth: [birth year]

## 2020-09-09 ENCOUNTER — Ambulatory Visit (INDEPENDENT_AMBULATORY_CARE_PROVIDER_SITE_OTHER): Payer: Worker's Compensation | Admitting: Physical Therapy

## 2020-09-09 ENCOUNTER — Other Ambulatory Visit: Payer: Self-pay

## 2020-09-09 ENCOUNTER — Ambulatory Visit (INDEPENDENT_AMBULATORY_CARE_PROVIDER_SITE_OTHER): Payer: Worker's Compensation | Admitting: Orthopedic Surgery

## 2020-09-09 ENCOUNTER — Encounter: Payer: Self-pay | Admitting: Physical Therapy

## 2020-09-09 ENCOUNTER — Encounter: Payer: Self-pay | Admitting: Orthopedic Surgery

## 2020-09-09 DIAGNOSIS — M25512 Pain in left shoulder: Secondary | ICD-10-CM | POA: Diagnosis not present

## 2020-09-09 DIAGNOSIS — M25612 Stiffness of left shoulder, not elsewhere classified: Secondary | ICD-10-CM

## 2020-09-09 DIAGNOSIS — M5440 Lumbago with sciatica, unspecified side: Secondary | ICD-10-CM

## 2020-09-09 DIAGNOSIS — M545 Low back pain, unspecified: Secondary | ICD-10-CM

## 2020-09-09 DIAGNOSIS — M25562 Pain in left knee: Secondary | ICD-10-CM

## 2020-09-09 DIAGNOSIS — G8929 Other chronic pain: Secondary | ICD-10-CM

## 2020-09-09 DIAGNOSIS — M6281 Muscle weakness (generalized): Secondary | ICD-10-CM

## 2020-09-09 DIAGNOSIS — R600 Localized edema: Secondary | ICD-10-CM

## 2020-09-09 DIAGNOSIS — S46012D Strain of muscle(s) and tendon(s) of the rotator cuff of left shoulder, subsequent encounter: Secondary | ICD-10-CM

## 2020-09-09 DIAGNOSIS — R2689 Other abnormalities of gait and mobility: Secondary | ICD-10-CM

## 2020-09-09 DIAGNOSIS — M25551 Pain in right hip: Secondary | ICD-10-CM

## 2020-09-09 DIAGNOSIS — M62830 Muscle spasm of back: Secondary | ICD-10-CM

## 2020-09-09 MED ORDER — ACETAMINOPHEN-CODEINE #3 300-30 MG PO TABS: 1.0000 | ORAL_TABLET | Freq: Four times a day (QID) | ORAL | 0 refills | Status: DC | PRN

## 2020-09-09 MED ORDER — ACETAMINOPHEN-CODEINE #3 300-30 MG PO TABS: 1.0000 | ORAL_TABLET | Freq: Two times a day (BID) | ORAL | 0 refills | Status: DC | PRN

## 2020-09-09 NOTE — Therapy (Signed)
[facility] Physical Therapy [address] 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 phone is [phone]    Authorization - Visit Number 7    Authorization - Number of Visits 8    Progress Note Due on Visit 8    PT Start Time 0928    PT Stop Time 1010    PT Time Calculation (min) 42 min    Activity Tolerance Patient tolerated treatment well    Behavior During Therapy [facility] for tasks assessed/performed           Past Medical History:  Diagnosis Date  . Chronic kidney disease    only has one kidney  . Diabetes mellitus without complication ([place])   . Essential hypertension   . Hyperlipidemia   . Hypertension    Phreesia 01/13/2020    Past Surgical History:  Procedure Laterality Date  . KIDNEY DONATION    . MANDIBLE FRACTURE SURGERY    . ROTATOR CUFF REPAIR Right     There were no vitals filed for this visit.   Subjective Assessment - 09/09/20 0934    Subjective Pt reporting feeling better overall. Pt still reporting difficulties with sleeping and getting comfortable in bed. Pt stating he is still sleeping in his recliner.  Also difficulties with reaching behind his back.    Pertinent History PMH: Rt RTC repair 07/06/20,CKD(only has one kidney, DM,HTN)    Limitations Lifting;House hold activities    Patient Stated Goals To improve the use of his L shoulder    Currently in Pain? Yes    Pain Score 2     Pain Location Shoulder    Pain Descriptors / Indicators Aching;Sore    Pain Type Surgical pain    Pain Onset More than a  month ago              [facility] PT Assessment - 09/09/20 0001      Assessment   Medical Diagnosis Lt RTC repair 07/06/20    Referring Provider (PT) Marlou Sa, MD    Next MD Visit 09/09/20      AROM   Left Shoulder Flexion 98 Degrees    Left Shoulder ABduction 92 Degrees      PROM   Left Shoulder Flexion 150 Degrees   supine   Left Shoulder ABduction 120 Degrees   supine   Left Shoulder Internal Rotation 60 Degrees   shoulder abd 45 degrees   Left Shoulder External Rotation 50 Degrees   shoulder abd 45 degrees                        [facility] Adult PT Treatment/Exercise - 09/09/20 0001      Shoulder Exercises: Standing   External Rotation Left;20 reps    Theraband Level (Shoulder External Rotation) Level 2 (Red)    Internal Rotation Left;20 reps    Theraband Level (Shoulder Internal Rotation) Level 2 (Red)    Flexion AROM;10 reps    Shoulder Flexion Weight (lbs) 3# bar  ABduction AROM;10 reps    Shoulder ABduction Weight (lbs) 3# bar    Extension 20 reps;Both    Theraband Level (Shoulder Extension) Level 3 (Green)    Row Both;20 reps    Theraband Level (Shoulder Row) Level 3 (Green)      Shoulder Exercises: Stretch   Other Shoulder Stretches red physioball on the wall flexion stretch holding 5 seconds x 10 reps      Vasopneumatic   Number Minutes Vasopneumatic  10 minutes    Vasopnuematic Location  Shoulder    Vasopneumatic Pressure Medium    Vasopneumatic Temperature  34      Manual Therapy   Manual therapy comments Grade 3 GH mobs in fleixon, scaption and ER                    PT Short Term Goals - 09/09/20 0939      PT SHORT TERM GOAL #1   Title Pt will be Ind in an initial HEP    Status Achieved      PT SHORT TERM GOAL #2   Title Pt will voice understanding of messures to assist in pain reduction    Status Achieved      PT SHORT TERM GOAL #3   Title Patient will increase pain free shoulder AROM flexion and abduciton to 100 degrees     Baseline 95 deg now    Status Partially Met             PT Long Term Goals - 09/09/20 0941      PT LONG TERM GOAL #1   Title Pt will be Ind in a final HEP to maintain or progress achieved LOF    Baseline -    Status On-going      PT LONG TERM GOAL #2   Title Increase L shoulder strength  to 4+-5/5 for improved functional 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    Status On-going      PT LONG TERM GOAL #4   Title Pt's L shoulder will decreased to less than 3/10 c household activites and drivng a truck.    Status On-going                  Patient will benefit from skilled therapeutic intervention in order to improve the following deficits and impairments:     Visit Diagnosis: Acute pain of left shoulder  Stiffness of left shoulder, not elsewhere classified  Muscle weakness (generalized)  Localized edema  Chronic bilateral low back pain without sciatica  Other abnormalities of gait and mobility  Muscle spasm of back  Acute pain of left knee  Acute bilateral low back pain with sciatica, sciatica laterality unspecified  Pain in right hip     Problem List Patient Active Problem List   Diagnosis Date Noted  . Acute pain of left shoulder 05/19/2020  . Tobacco dependence 03/26/2019  . Essential hypertension 03/26/2019  . Type 2 diabetes mellitus without complication, without long-term current use of insulin ([place]) 03/26/2019  . Aneurysm, cerebral, nonruptured 03/26/2019    Oretha Caprice ,PT, MPT 09/09/2020, 10:04 AM  [facility] Physical Therapy [address] [place], [place], [address] Phone: [phone]  Fax:  [phone]  Name: Curtis Williamson MRN: [id number] Date of Birth: [birth year]

## 2020-09-09 NOTE — Progress Notes (Signed)
   Post-Op Visit Note   Patient: Curtis Williamson           Date of Birth: March 18, 1961           MRN: [id number] Visit Date: 09/09/2020 PCP: [name], DO   Assessment & Plan:  Chief Complaint:  Chief Complaint  Patient presents with  . Left Shoulder - Routine Post Op   Visit Diagnoses: No diagnosis found.  Plan: Curtis Williamson is now about 2 months out left shoulder rotator cuff tear repair.  In general he is doing better.  CPM machine is at 130.  His passive range of motion is 20/80/130.  Rotator cuff strength feels good.  No coarse grinding with passive range of motion.  Overall Curtis Williamson is doing better.  He is supraclavicular lymph node has diminished.  No warmth to the incision.  Plan at this time is to continue with therapy for 6 more weeks.  I do not really think he needs the CPM machine at this time.  Refill Tylenol 3.  Out of work for 6 weeks.  May need FCE and sometime in the future before returning to work but overall he is made significant progress compared to last visit.  Nurse case manager present for discussion of treatment plan  Follow-Up Instructions: Return in about 6 weeks (around 10/21/2020).   Orders:  No orders of the defined types were placed in this encounter.  No orders of the defined types were placed in this encounter.   Imaging: No results found.  PMFS History: Patient Active Problem List   Diagnosis Date Noted  . Acute pain of left shoulder 05/19/2020  . Tobacco dependence 03/26/2019  . Essential hypertension 03/26/2019  . Type 2 diabetes mellitus without complication, without long-term current use of insulin ([facility]) 03/26/2019  . Aneurysm, cerebral, nonruptured 03/26/2019   Past Medical History:  Diagnosis Date  . Chronic kidney disease    only has one kidney  . Diabetes mellitus without complication ([facility])   . Essential hypertension   . Hyperlipidemia   . Hypertension    Phreesia 01/13/2020    Family History  Problem Relation Age of  Onset  . Hypertension Mother   . Kidney failure Mother   . Kidney disease Mother   . Heart disease Mother   . ADD / ADHD Daughter   . Anxiety disorder Daughter   . Alcohol abuse Father   . Colon cancer Neg Hx   . Stomach cancer Neg Hx   . Esophageal cancer Neg Hx   . Colon polyps Neg Hx     Past Surgical History:  Procedure Laterality Date  . KIDNEY DONATION    . MANDIBLE FRACTURE SURGERY    . ROTATOR CUFF REPAIR Right    Social History   Occupational History  . Occupation: Truck [employer] doctor  Tobacco Use  . Smoking status: Current Every Day Smoker    Packs/day: 0.25    Years: 30.00    Pack years: 7.50    Types: Cigarettes  . Smokeless tobacco: Never Used  . Tobacco comment: off and on, has tried to quit  Vaping Use  . Vaping Use: Never used  Substance and Sexual Activity  . Alcohol use: Yes    Comment: rarely  . Drug use: Never  . Sexual activity: Not on file

## 2020-09-10 ENCOUNTER — Ambulatory Visit (INDEPENDENT_AMBULATORY_CARE_PROVIDER_SITE_OTHER): Payer: Worker's Compensation | Admitting: Physical Therapy

## 2020-09-10 DIAGNOSIS — M25512 Pain in left shoulder: Secondary | ICD-10-CM

## 2020-09-10 DIAGNOSIS — R600 Localized edema: Secondary | ICD-10-CM

## 2020-09-10 DIAGNOSIS — M25612 Stiffness of left shoulder, not elsewhere classified: Secondary | ICD-10-CM | POA: Diagnosis not present

## 2020-09-10 DIAGNOSIS — M6281 Muscle weakness (generalized): Secondary | ICD-10-CM | POA: Diagnosis not present

## 2020-09-10 NOTE — Therapy (Signed)
[facility] Physical Therapy [address] [place], [place], [address] Phone: [phone]   Fax:  [phone]  Physical Therapy Treatment  Patient Details  Name: Keylor Rands MRN: [id number] Date of Birth: 05-06-1960 Referring Provider (PT): Marlou Sa, MD   Encounter Date: 09/10/2020   PT End of Session - 09/10/20 0906    Visit Number 8    Number of Visits 18    Date for PT Re-Evaluation 10/16/20    Authorization Type workers comp,[employer] is case Freight forwarder. Jearld Pies for PT in person 8 visits to start. phone is [phone], Patient relays 12 more have been authorized    Authorization - Visit Number 8    Authorization - Number of Visits 20   patient relays 12 more have been authorized   Progress Note Due on Visit 8    PT Start Time 0850    PT Stop Time 0935    PT Time Calculation (min) 45 min    Activity Tolerance Patient tolerated treatment well    Behavior During Therapy [facility] for tasks assessed/performed           Past Medical History:  Diagnosis Date  . Chronic kidney disease    only has one kidney  . Diabetes mellitus without complication ([place])   . Essential hypertension   . Hyperlipidemia   . Hypertension    Phreesia 01/13/2020    Past Surgical History:  Procedure Laterality Date  . KIDNEY DONATION    . MANDIBLE FRACTURE SURGERY    . ROTATOR CUFF REPAIR Right     There were no vitals filed for this visit.   Subjective Assessment - 09/10/20 0854    Subjective Pt reporting feeling bad today has 6/10 pain all over in his shoulder, neck and back. He wants to know what PT can do for his back and neck however we only have PT referral for shoulder at this time. He was encouraged to reach out to case manager and MD about getting authorization for his neck and back.    Pertinent History PMH: Rt RTC repair 07/06/20,CKD(only has one kidney, DM,HTN)    Limitations Lifting;House hold activities    Patient Stated Goals To improve the use of his L  shoulder    Pain Onset More than a month ago    Pain Onset More than a month ago                             [facility] Adult PT Treatment/Exercise - 09/10/20 0001      Shoulder Exercises: Standing   Flexion AAROM;Both;10 reps    ABduction AROM;Both;10 reps    Extension 20 reps;Both    Theraband Level (Shoulder Extension) Level 3 (Green)    Row Both;20 reps    Theraband Level (Shoulder Row) Level 3 (Green)      Shoulder Exercises: Pulleys   Flexion 2 minutes    ABduction 2 minutes      Vasopneumatic   Number Minutes Vasopneumatic  10 minutes    Vasopnuematic Location  Shoulder    Vasopneumatic Pressure Medium    Vasopneumatic Temperature  34      Manual Therapy   Manual therapy comments Grade 3 GH mobs distraction and inferior in fleixon, scaption and then A-P in ER, Lt shoulder PROM to tolerance all planes          Moist heat to low back X 10 min at end while on Vaso.  PT Short Term Goals - 09/09/20 0939      PT SHORT TERM GOAL #1   Title Pt will be Ind in an initial HEP    Status Achieved      PT SHORT TERM GOAL #2   Title Pt will voice understanding of messures to assist in pain reduction    Status Achieved      PT SHORT TERM GOAL #3   Title Patient will increase pain free shoulder AROM flexion and abduciton to 100 degrees    Baseline 95 deg now    Status Partially Met             PT Long Term Goals - 09/09/20 0941      PT LONG TERM GOAL #1   Title Pt will be Ind in a final HEP to maintain or progress achieved LOF    Baseline -    Status On-going      PT LONG TERM GOAL #2   Title Increase L shoulder strength  to 4+-5/5 for improved functional 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    Status On-going       PT LONG TERM GOAL #4   Title Pt's L shoulder will decreased to less than 3/10 c household activites and drivng a truck.    Status On-going                 Plan - 09/10/20 0912    Clinical Impression Statement Continued to focus on his shoulder ROM and strength but less overall activity tolerance today from him due to widespread pain. PT did print out some back and neck stretches to add in at home due to his complaints of back and neck pain. He did appear to get some relief from the [facility] distraction and inferior mobs as well as from the vasopnuematic device.    Personal Factors and Comorbidities Comorbidity 1;Profession    Comorbidities PMH: Rt RTC repair 07/06/20,CKD(only has one kidney, DM,HTN)    Examination-Activity Limitations Carry;Lift;Reach Overhead    Examination-Participation Restrictions Occupation    Stability/Clinical Decision Making Evolving/Moderate complexity    Rehab Potential Good    PT Frequency 3x / week    PT Duration 8 weeks    PT Treatment/Interventions ADLs/Self Care Home Management;Cryotherapy;Electrical Stimulation;Ultrasound;Traction;Moist Heat;Iontophoresis 7m/ml Dexamethasone;Functional mobility training;Therapeutic activities;Therapeutic exercise;Balance training;Manual techniques;Patient/family education;Passive range of motion;Dry needling;Taping;Vasopneumatic Device;Joint Manipulations;Neuromuscular re-education    PT Next Visit Plan Continue wth ROM focus then light strength. Continue manual and ther ex to progress end range mobility in all directions, specifically ER.    PT Home Exercise Plan Access Code: [email or web address]   Consulted and Agree with Plan of Care Patient           Patient will benefit from skilled therapeutic intervention in order to improve the following deficits and impairments:  Decreased strength,Pain,Impaired UE functional use,Decreased range of motion,Decreased activity tolerance,Decreased mobility,Increased edema,Increased muscle  spasms  Visit Diagnosis: Acute pain of left shoulder  Stiffness of left shoulder, not elsewhere classified  Muscle weakness (generalized)  Localized edema     Problem List Patient Active Problem List   Diagnosis Date Noted  . Acute pain of left shoulder 05/19/2020  . Tobacco dependence 03/26/2019  .  Essential hypertension 03/26/2019  . Type 2 diabetes mellitus without complication, without long-term current use of insulin ([place]) 03/26/2019  . Aneurysm, cerebral, nonruptured 03/26/2019    Silvestre Mesi 09/10/2020, 9:32 AM  [facility] Physical Therapy [address] [place], [place], [address] Phone: [phone]   Fax:  [phone]  Name: Jonaven Hilgers MRN: [id number] Date of Birth: May 02, 1960

## 2020-09-10 NOTE — Patient Instructions (Signed)
Access Code: [email or web address] URL: [email or web address] Date: 09/10/2020 Prepared by: Ivery Quale  Exercises Supine Shoulder Flexion Extension AAROM with Dowel - 3 x daily - 7 x weekly - 1 sets - 10 reps Supine Shoulder External Rotation in 45 Degrees Abduction AAROM with Dowel - 3 x daily - 7 x weekly - 1 sets - 10 reps Shoulder Scaption AAROM with Dowel - 3 x daily - 7 x weekly - 1 sets - 10 reps Seated Shoulder External Rotation PROM on Table - 2-3 x daily - 6 x weekly - 10 reps - 1-2 sets - 5 hold Shoulder Abduction - Thumbs Up - 2 x daily - 6 x weekly - 3 sets - 10 reps Standing Shoulder Flexion to 90 Degrees - 2 x daily - 6 x weekly - 3 sets - 10 reps Standing Shoulder Row with Anchored Resistance - 2 x daily - 7 x weekly - 10 reps - 3 sets Shoulder Extension with Resistance - 2 x daily - 7 x weekly - 10 reps - 3 sets Shoulder External Rotation with Anchored Resistance - 2 x daily - 6 x weekly - 10 reps - 2-3 sets Standing Shoulder Internal Rotation with Anchored Resistance - 2 x daily - 6 x weekly - 3 sets - 10 reps Supine Lower Trunk Rotation - 2 x daily - 6 x weekly - 10 reps - 1 sets - 5 sec hold Hooklying Single Knee to Chest Stretch - 2 x daily - 6 x weekly - 2 reps - 1 sets - 20 hold Supine Bridge - 2 x daily - 6 x weekly - 10 reps - 1-2 sets - 5 hold Supine Cervical Sidebending Stretch - 2 x daily - 6 x weekly - 1 sets - 3 reps - 15-20 hold

## 2020-09-15 ENCOUNTER — Other Ambulatory Visit: Payer: Self-pay

## 2020-09-15 ENCOUNTER — Ambulatory Visit (INDEPENDENT_AMBULATORY_CARE_PROVIDER_SITE_OTHER): Payer: Worker's Compensation | Admitting: Physical Therapy

## 2020-09-15 DIAGNOSIS — M6281 Muscle weakness (generalized): Secondary | ICD-10-CM

## 2020-09-15 DIAGNOSIS — M25512 Pain in left shoulder: Secondary | ICD-10-CM

## 2020-09-15 DIAGNOSIS — M25612 Stiffness of left shoulder, not elsewhere classified: Secondary | ICD-10-CM | POA: Diagnosis not present

## 2020-09-15 DIAGNOSIS — R600 Localized edema: Secondary | ICD-10-CM

## 2020-09-15 NOTE — Therapy (Signed)
[facility] [address] [place], [place], [address] Phone: [phone]   Fax:  [phone]  Physical Therapy Treatment  Patient Details  Name: Curtis Williamson MRN: [id number] Date of Birth: 13-Jul-1960 Referring Provider (PT): Marlou Sa, MD   Encounter Date: 09/15/2020   PT End of Session - 09/15/20 0940    Visit Number 9    Number of Visits 18    Date for PT Re-Evaluation 10/16/20    Authorization Type workers comp,[employer] is case Freight forwarder. Jearld Pies for PT in person 8 visits to start. phone is [phone], Patient relays 12 more have been authorized    Authorization - Visit Number 9    Authorization - Number of Visits 20   patient relays 12 more have been authorized   Progress Note Due on Visit 20    PT Start Time [phone]    PT Stop Time 0943    PT Time Calculation (min) 52 min    Activity Tolerance Patient tolerated treatment well    Behavior During Therapy [facility] for tasks assessed/performed           Past Medical History:  Diagnosis Date  . Chronic kidney disease    only has one kidney  . Diabetes mellitus without complication ([place])   . Essential hypertension   . Hyperlipidemia   . Hypertension    Phreesia 01/13/2020    Past Surgical History:  Procedure Laterality Date  . KIDNEY DONATION    . MANDIBLE FRACTURE SURGERY    . ROTATOR CUFF REPAIR Right     There were no vitals filed for this visit.   Subjective Assessment - 09/15/20 0918    Subjective Pt reporting feeling worsetoday has 8/10 pain in his back and hip. He relays his Lt shoulder pain is about 5/10.He is still talking with workers comp about referral for PT to treat back as well.    Pertinent History PMH: Rt RTC repair 07/06/20,CKD(only has one kidney, DM,HTN)    Limitations Lifting;House hold activities    Patient Stated Goals To improve the use of his L shoulder    Pain Onset More than a month ago    Pain Onset More than a month ago                              [facility] Adult PT Treatment/Exercise - 09/15/20 0001      Shoulder Exercises: Seated   Other Seated Exercises wall ladder X10 reps flexion    Other Seated Exercises Rows and extensions with green X20 ea, bilat ER green 2X10      Shoulder Exercises: Pulleys   Flexion 2 minutes    ABduction 2 minutes      Shoulder Exercises: ROM/Strengthening   Nustep L5 UE/LE X10  min with heat to back      Vasopneumatic   Number Minutes Vasopneumatic  10 minutes    Vasopnuematic Location  Shoulder    Vasopneumatic Pressure Medium    Vasopneumatic Temperature  34      Manual Therapy   Manual therapy comments Grade 3 GH mobs distraction and inferior in fleixon, scaption and then A-P in ER, Lt shoulder PROM to tolerance all planes                    PT Short Term Goals - 09/09/20 0939      PT SHORT TERM GOAL #1   Title Pt will be Ind in  an initial HEP    Status Achieved      PT SHORT TERM GOAL #2   Title Pt will voice understanding of messures to assist in pain reduction    Status Achieved      PT SHORT TERM GOAL #3   Title Patient will increase pain free shoulder AROM flexion and abduciton to 100 degrees    Baseline 95 deg now    Status Partially Met             PT Long Term Goals - 09/09/20 0941      PT LONG TERM GOAL #1   Title Pt will be Ind in a final HEP to maintain or progress achieved LOF    Baseline -    Status On-going      PT LONG TERM GOAL #2   Title Increase L shoulder strength  to 4+-5/5 for improved functional 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    Status On-going      PT LONG TERM GOAL #4   Title Pt's L shoulder will decreased to less than 3/10 c household activites and drivng a truck.    Status  On-going                 Plan - 09/15/20 0941    Clinical Impression Statement Continued to focus on his Lt shoulder today as this is still the only written PT referral we have at this time. His Lt shoulder is improving as expected but his funcitonal activity levels continue to be limited more by his back and hip pain and he was not able to perform standing exercises today and we had to modify to seated versions. We will continue to progress him as he can tolerate.    Personal Factors and Comorbidities Comorbidity 1;Profession    Comorbidities PMH: Rt RTC repair 07/06/20,CKD(only has one kidney, DM,HTN)    Examination-Activity Limitations Carry;Lift;Reach Overhead    Examination-Participation Restrictions Occupation    Stability/Clinical Decision Making Evolving/Moderate complexity    Rehab Potential Good    PT Frequency 3x / week    PT Duration 8 weeks    PT Treatment/Interventions ADLs/Self Care Home Management;Cryotherapy;Electrical Stimulation;Ultrasound;Traction;Moist Heat;Iontophoresis 71m/ml Dexamethasone;Functional mobility training;Therapeutic activities;Therapeutic exercise;Balance training;Manual techniques;Patient/family education;Passive range of motion;Dry needling;Taping;Vasopneumatic Device;Joint Manipulations;Neuromuscular re-education    PT Next Visit Plan Continue wth ROM focus then light strength. Continue manual and ther ex to progress end range mobility in all directions, specifically ER. Check to see if we have referral for back also    PT Home Exercise Plan Access Code: [email or web address]   Consulted and Agree with Plan of Care Patient           Patient will benefit from skilled therapeutic intervention in order to improve the following deficits and impairments:  Decreased strength,Pain,Impaired UE functional use,Decreased range of motion,Decreased activity tolerance,Decreased mobility,Increased edema,Increased muscle spasms  Visit Diagnosis: Acute pain of left  shoulder  Stiffness of left shoulder, not elsewhere classified  Muscle weakness (generalized)  Localized edema     Problem List Patient Active Problem List   Diagnosis Date Noted  . Acute pain of left shoulder 05/19/2020  . Tobacco dependence 03/26/2019  . Essential hypertension 03/26/2019  . Type  2 diabetes mellitus without complication, without long-term current use of insulin ([place]) 03/26/2019  . Aneurysm, cerebral, nonruptured 03/26/2019    Silvestre Mesi 09/15/2020, 9:45 AM  [facility] [address] [place], [place], [address] Phone: [phone]   Fax:  [phone]  Name: Curtis Williamson MRN: [id number] Date of Birth: 07-30-1960

## 2020-09-17 ENCOUNTER — Ambulatory Visit (INDEPENDENT_AMBULATORY_CARE_PROVIDER_SITE_OTHER): Payer: Worker's Compensation | Admitting: Physical Therapy

## 2020-09-17 ENCOUNTER — Other Ambulatory Visit: Payer: Self-pay

## 2020-09-17 DIAGNOSIS — M25512 Pain in left shoulder: Secondary | ICD-10-CM | POA: Diagnosis not present

## 2020-09-17 DIAGNOSIS — M25612 Stiffness of left shoulder, not elsewhere classified: Secondary | ICD-10-CM

## 2020-09-17 DIAGNOSIS — M6281 Muscle weakness (generalized): Secondary | ICD-10-CM | POA: Diagnosis not present

## 2020-09-17 DIAGNOSIS — R600 Localized edema: Secondary | ICD-10-CM

## 2020-09-17 NOTE — Therapy (Signed)
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 phone is [phone], Patient relays 12 more have been authorized    Authorization - Visit Number 10    Authorization - Number of Visits 20   patient relays 12 more have been authorized   Progress Note Due on Visit 20    PT Start Time [phone]    PT Stop Time 0935    PT Time Calculation (min) 46 min    Activity Tolerance Patient tolerated treatment well    Behavior During Therapy [facility] for tasks assessed/performed           Past Medical History:  Diagnosis Date  . Chronic kidney disease    only has one kidney  . Diabetes mellitus without complication ([place])   . Essential hypertension   . Hyperlipidemia   . Hypertension    Phreesia 01/13/2020    Past Surgical History:  Procedure Laterality Date  . KIDNEY DONATION    . MANDIBLE FRACTURE SURGERY    . ROTATOR CUFF REPAIR Right     There were no vitals filed for this visit.   Subjective Assessment - 09/17/20 0903    Subjective Pt reporting 7/10 back pain today and 4/10 left shoulder pain    Pertinent History PMH: Rt RTC repair 07/06/20,CKD(only has one kidney, DM,HTN)    Limitations Lifting;House hold activities    Patient Stated Goals To improve the use of his L shoulder    Pain Onset More than a month ago    Pain Onset More than a month ago                             [facility] Adult PT Treatment/Exercise - 09/17/20 0001      Lumbar Exercises: Stretches   Single Knee to Chest  Stretch Right;Left;2 reps;30 seconds    Lower Trunk Rotation Limitations 5 sec X10 reps bilat      Lumbar Exercises: Aerobic   Nustep L5X8 min UE/LE seat 13 with heat to back      Shoulder Exercises: Supine   Flexion AAROM;Left;10 reps    Shoulder Flexion Weight (lbs) 3      Shoulder Exercises: Seated   Other Seated Exercises table stretch into abd X10, into ER X10    Other Seated Exercises Rows and extensions with green X20 ea, bilat ER green 2X10      Vasopneumatic   Number Minutes Vasopneumatic  10 minutes    Vasopnuematic Location  Shoulder    Vasopneumatic Pressure Medium    Vasopneumatic Temperature  34      Manual Therapy   Manual therapy comments Grade 3 GH mobs distraction and inferior in fleixon, scaption and then A-P in ER, Lt shoulder PROM to tolerance all planes                    PT Short Term Goals - 09/09/20 0939      PT SHORT TERM  GOAL #1   Title Pt will be Ind in an initial HEP    Status Achieved      PT SHORT TERM GOAL #2   Title Pt will voice understanding of messures to assist in pain reduction    Status Achieved      PT SHORT TERM GOAL #3   Title Patient will increase pain free shoulder AROM flexion and abduciton to 100 degrees    Baseline 95 deg now    Status Partially Met             PT Long Term Goals - 09/09/20 0941      PT LONG TERM GOAL #1   Title Pt will be Ind in a final HEP to maintain or progress achieved LOF    Baseline -    Status On-going      PT LONG TERM GOAL #2   Title Increase L shoulder strength  to 4+-5/5 for improved functional 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    Status On-going      PT LONG TERM GOAL #4   Title Pt's L shoulder will decreased to less than 3/10 c household activites and  drivng a truck.    Status On-going                 Plan - 09/17/20 0174    Clinical Impression Statement His ROM is slowly improving in his Lt shoulder but still with mild limitations. We were unable to do standing activity due to back pain so continued with seated and supine exercises for shoulder strength and ROM.    Personal Factors and Comorbidities Comorbidity 1;Profession    Comorbidities PMH: Rt RTC repair 07/06/20,CKD(only has one kidney, DM,HTN)    Examination-Activity Limitations Carry;Lift;Reach Overhead    Examination-Participation Restrictions Occupation    Stability/Clinical Decision Making Evolving/Moderate complexity    Rehab Potential Good    PT Frequency 3x / week    PT Duration 8 weeks    PT Treatment/Interventions ADLs/Self Care Home Management;Cryotherapy;Electrical Stimulation;Ultrasound;Traction;Moist Heat;Iontophoresis 72m/ml Dexamethasone;Functional mobility training;Therapeutic activities;Therapeutic exercise;Balance training;Manual techniques;Patient/family education;Passive range of motion;Dry needling;Taping;Vasopneumatic Device;Joint Manipulations;Neuromuscular re-education    PT Next Visit Plan Continue wth ROM focus then light strength. Continue manual and ther ex to progress end range mobility in all directions, specifically ER. Check to see if we have referral for back also    PT Home Exercise Plan Access Code: [email or web address]   Consulted and Agree with Plan of Care Patient           Patient will benefit from skilled therapeutic intervention in order to improve the following deficits and impairments:  Decreased strength,Pain,Impaired UE functional use,Decreased range of motion,Decreased activity tolerance,Decreased mobility,Increased edema,Increased muscle spasms  Visit Diagnosis: Acute pain of left shoulder  Stiffness of left shoulder, not elsewhere classified  Muscle weakness (generalized)  Localized edema     Problem List Patient Active  Problem List   Diagnosis Date Noted  . Acute pain of left shoulder 05/19/2020  . Tobacco dependence 03/26/2019  . Essential hypertension 03/26/2019  . Type 2 diabetes mellitus without complication, without long-term [place] use of insulin [place] 03/26/2019  . Aneurysm, cerebral, nonruptured 03/26/2019    BSilvestre Mesi6/05/2020,  9:30 AM  [facility] [address] [place], [place], [address] Phone: [phone]   Fax:  [phone]  Name: Siaosi Alter MRN: [id number] Date of Birth: 1960-11-14

## 2020-09-22 ENCOUNTER — Other Ambulatory Visit: Payer: Self-pay

## 2020-09-22 ENCOUNTER — Ambulatory Visit (INDEPENDENT_AMBULATORY_CARE_PROVIDER_SITE_OTHER): Payer: Worker's Compensation | Admitting: Physical Therapy

## 2020-09-22 ENCOUNTER — Ambulatory Visit: Payer: Medicaid Other | Admitting: Physician Assistant

## 2020-09-22 VITALS — BP 101/82 | HR 88 | Temp 98.2°F | Resp 18

## 2020-09-22 DIAGNOSIS — E785 Hyperlipidemia, unspecified: Secondary | ICD-10-CM

## 2020-09-22 DIAGNOSIS — M25512 Pain in left shoulder: Secondary | ICD-10-CM | POA: Diagnosis not present

## 2020-09-22 DIAGNOSIS — L602 Onychogryphosis: Secondary | ICD-10-CM | POA: Insufficient documentation

## 2020-09-22 DIAGNOSIS — M25612 Stiffness of left shoulder, not elsewhere classified: Secondary | ICD-10-CM | POA: Diagnosis not present

## 2020-09-22 DIAGNOSIS — I10 Essential (primary) hypertension: Secondary | ICD-10-CM | POA: Diagnosis not present

## 2020-09-22 DIAGNOSIS — R600 Localized edema: Secondary | ICD-10-CM

## 2020-09-22 DIAGNOSIS — M6281 Muscle weakness (generalized): Secondary | ICD-10-CM | POA: Diagnosis not present

## 2020-09-22 DIAGNOSIS — E119 Type 2 diabetes mellitus without complications: Secondary | ICD-10-CM

## 2020-09-22 DIAGNOSIS — G4700 Insomnia, unspecified: Secondary | ICD-10-CM

## 2020-09-22 LAB — POCT GLYCOSYLATED HEMOGLOBIN (HGB A1C): Hemoglobin A1C: 7.5 % — AB (ref 4.0–5.6)

## 2020-09-22 MED ORDER — METFORMIN HCL 1000 MG PO TABS: 1000.0000 mg | ORAL_TABLET | Freq: Two times a day (BID) | ORAL | 3 refills | Status: DC

## 2020-09-22 MED ORDER — LISINOPRIL-HYDROCHLOROTHIAZIDE 10-12.5 MG PO TABS: 1.0000 | ORAL_TABLET | Freq: Every day | ORAL | 1 refills | Status: DC

## 2020-09-22 MED ORDER — HYDROXYZINE HCL 25 MG PO TABS: 25.0000 mg | ORAL_TABLET | Freq: Four times a day (QID) | ORAL | 2 refills | Status: DC

## 2020-09-22 MED ORDER — ACCU-CHEK GUIDE ME W/DEVICE KIT: 1.0000 | PACK | Freq: Two times a day (BID) | 0 refills | Status: DC

## 2020-09-22 MED ORDER — ACCU-CHEK GUIDE VI STRP: ORAL_STRIP | 7 refills | Status: AC

## 2020-09-22 MED ORDER — EMPAGLIFLOZIN 10 MG PO TABS: 10.0000 mg | ORAL_TABLET | Freq: Every day | ORAL | 0 refills | Status: DC

## 2020-09-22 MED ORDER — ACCU-CHEK SOFTCLIX LANCETS MISC
12 refills | Status: DC
Start: 2020-09-22 — End: 2022-10-05

## 2020-09-22 MED ORDER — TRAZODONE HCL 50 MG PO TABS: 25.0000 mg | ORAL_TABLET | Freq: Every evening | ORAL | 3 refills | Status: DC | PRN

## 2020-09-22 MED ORDER — ATORVASTATIN CALCIUM 10 MG PO TABS: 10.0000 mg | ORAL_TABLET | Freq: Every day | ORAL | 1 refills | Status: DC

## 2020-09-22 NOTE — Progress Notes (Signed)
Established Patient Office Visit  Subjective:  Patient ID: Curtis Williamson, male    DOB: 07-Jan-1961  Age: 60 y.o. MRN: [id number]  CC:  Chief Complaint  Patient presents with  . Diabetes Mellitus    Refills    HPI Curtis Williamson presents for medication refills with complaint of insomnia.  Reports that he is unable to check his blood glucose levels at home, states that his meter stopped working.  Reports that he has been compliant to his medication and is working on improving his diet.  Reports that he has difficulty falling asleep and staying asleep.  Reports he is only sleeping 2 to 3 hours a night.  Does endorse that he sleeps with his television on.  Reports that he has tried hydroxyzine, Benadryl and melatonin without relief.  Reports that he has thickened toenails on both feet, states that they are impossible for him to trim.  Reports he used to be seen by podiatry and request referral back.    Past Medical History:  Diagnosis Date  . Chronic kidney disease    only has one kidney  . Diabetes mellitus without complication ([place])   . Essential hypertension   . Hyperlipidemia   . Hypertension    Phreesia 01/13/2020    Past Surgical History:  Procedure Laterality Date  . KIDNEY DONATION    . MANDIBLE FRACTURE SURGERY    . ROTATOR CUFF REPAIR Right     Family History  Problem Relation Age of Onset  . Hypertension Mother   . Kidney failure Mother   . Kidney disease Mother   . Heart disease Mother   . ADD / ADHD Daughter   . Anxiety disorder Daughter   . Alcohol abuse Father   . Colon cancer Neg Hx   . Stomach cancer Neg Hx   . Esophageal cancer Neg Hx   . Colon polyps Neg Hx     Social History   Socioeconomic History  . Marital status: Single    Spouse name: Not on file  . Number of children: 1  . Years of education: Not on file  . Highest education level: Some college, no degree  Occupational History  . Occupation: Truck Geophysicist/field seismologist  Tobacco Use   . Smoking status: Current Every Day Smoker    Packs/day: 0.25    Years: 30.00    Pack years: 7.50    Types: Cigarettes  . Smokeless tobacco: Never Used  . Tobacco comment: off and on, has tried to quit  Vaping Use  . Vaping Use: Never used  Substance and Sexual Activity  . Alcohol use: Yes    Comment: rarely  . Drug use: Never  . Sexual activity: Not Currently  Other Topics Concern  . Not on file  Social History Narrative   Right handed   One story home   No caffeine    Social Determinants of Health   Financial Resource Strain: Not on file  Food Insecurity: Not on file  Transportation Needs: Not on file  Physical Activity: Not on file  Stress: Not on file  Social Connections: Not on file  Intimate Partner Violence: Not on file    Outpatient Medications Prior to Visit  Medication Sig Dispense Refill  . acetaminophen-codeine (TYLENOL #3) 300-30 MG tablet Take 1 tablet by mouth every 12 (twelve) hours as needed for moderate pain. 30 tablet 0  . baclofen (LIORESAL) 10 MG tablet Take 0.5-1 tablets (5-10 mg total) by mouth 3 (three) times daily as needed  for muscle spasms. 30 each 3  . docusate sodium (COLACE) 100 MG capsule Take 1 capsule (100 mg total) by mouth 2 (two) times daily. 60 capsule 0  . fluticasone (FLONASE) 50 MCG/ACT nasal spray Place 2 sprays into both nostrils daily. 16 g 6  . gabapentin (NEURONTIN) 300 MG capsule TAKE 1 CAPSULE BY MOUTH AT BEDTIME , MAY INCREASE TO 1 CAPSULE BY MOUTH THREE TIMES A DAY IF NEEDED 90 capsule 3  . triamcinolone cream (KENALOG) 0.1 % Apply 1 application topically 2 (two) times daily. 30 g 2  . ACCU-CHEK GUIDE test strip USE TO CHECK FSBS TWICE A DAY. DX: E11.9 50 strip 7  . atorvastatin (LIPITOR) 10 MG tablet Take 1 tablet (10 mg total) by mouth daily. 90 tablet 1  . Blood Glucose Monitoring Suppl (ACCU-CHEK GUIDE ME) w/Device KIT 1 each by Does not apply route 2 (two) times daily. Use to check FSBS twice a day. Dx: E11.9 1 kit 0   . empagliflozin (JARDIANCE) 10 MG TABS tablet Take 1 tablet (10 mg total) by mouth daily before breakfast. 90 tablet 1  . hydrOXYzine (ATARAX/VISTARIL) 25 MG tablet Take 1 tablet (25 mg total) by mouth every 6 (six) hours. 120 tablet 2  . Lancets (ACCU-CHEK SOFT TOUCH) lancets Use to check FSBS twice a day. Dx: E11.9 200 each 1  . lisinopril-hydrochlorothiazide (ZESTORETIC) 10-12.5 MG tablet Take 1 tablet by mouth daily. 90 tablet 1  . metFORMIN (GLUCOPHAGE) 1000 MG tablet Take 1 tablet (1,000 mg total) by mouth 2 (two) times daily with a meal. 180 tablet 3  . acetaminophen-codeine (TYLENOL #3) 300-30 MG tablet Take 1 tablet by mouth every 6 (six) hours as needed for moderate pain. 30 tablet 0  . chlorzoxazone (PARAFON) 500 MG tablet TAKE 1 TABLET BY MOUTH 3 TIMES DAILY AS NEEDED FOR MUSCLE SPASMS. (Patient not taking: No sig reported) 90 tablet 1  . ketorolac (TORADOL) 10 MG tablet Take 1 tablet (10 mg total) by mouth every 8 (eight) hours as needed. (Patient not taking: No sig reported) 15 tablet 0  . ondansetron (ZOFRAN ODT) 4 MG disintegrating tablet Take 1 tablet (4 mg total) by mouth every 8 (eight) hours as needed for nausea or vomiting. (Patient not taking: No sig reported) 20 tablet 0  . ondansetron (ZOFRAN) 4 MG tablet Take 1 tablet (4 mg total) by mouth every 8 (eight) hours as needed for nausea or vomiting. (Patient not taking: No sig reported) 20 tablet 0  . oxyCODONE-acetaminophen (PERCOCET/ROXICET) 5-325 MG tablet 1 po q 8-12 hrs prn pain (Patient not taking: Reported on 09/22/2020) 40 tablet 0  . trimethoprim-polymyxin b (POLYTRIM) ophthalmic solution Place 1 drop into the right eye every 4 (four) hours. (Patient not taking: Reported on 09/22/2020) 10 mL 0   No facility-administered medications prior to visit.    Allergies  Allergen Reactions  . Mushroom Extract Complex     ROS Review of Systems  Constitutional: Negative.   HENT: Negative.   Eyes: Negative.   Respiratory:  Negative for shortness of breath.   Cardiovascular: Negative for chest pain.  Gastrointestinal: Negative.   Endocrine: Negative.   Genitourinary: Negative.   Musculoskeletal: Negative.   Skin: Negative.   Allergic/Immunologic: Negative.   Neurological: Negative.   Hematological: Negative.   Psychiatric/Behavioral: Positive for sleep disturbance. Negative for dysphoric mood, self-injury and suicidal ideas. The patient is not nervous/anxious.       Objective:    Physical Exam Vitals and nursing note reviewed.  Constitutional:      Appearance: Normal appearance.  HENT:     Head: Normocephalic and atraumatic.     Right Ear: External ear normal.     Left Ear: External ear normal.     Mouth/Throat:     Mouth: Mucous membranes are moist.     Pharynx: Oropharynx is clear.  Eyes:     Extraocular Movements: Extraocular movements intact.     Conjunctiva/sclera: Conjunctivae normal.     Pupils: Pupils are equal, round, and reactive to light.  Cardiovascular:     Rate and Rhythm: Normal rate and regular rhythm.     Pulses: Normal pulses.     Heart sounds: Normal heart sounds.  Pulmonary:     Effort: Pulmonary effort is normal.     Breath sounds: Normal breath sounds.  Musculoskeletal:        General: Normal range of motion.     Cervical back: Normal range of motion and neck supple.  Feet:     Right foot:     Toenail Condition: Right toenails are abnormally thick and long. Fungal disease present.    Left foot:     Toenail Condition: Left toenails are abnormally thick and long. Fungal disease present. Skin:    General: Skin is warm and dry.  Neurological:     General: No focal deficit present.     Mental Status: He is alert and oriented to person, place, and time.  Psychiatric:        Mood and Affect: Mood normal.        Behavior: Behavior normal.        Thought Content: Thought content normal.        Judgment: Judgment normal.     BP 101/82 (BP Location: Left Arm, Patient  Position: Sitting, Cuff Size: Normal)   Pulse 88   Temp 98.2 F (36.8 C) (Oral)   Resp 18   SpO2 98%  Wt Readings from Last 3 Encounters:  07/13/20 193 lb 3.2 oz (87.6 kg)  05/19/20 190 lb (86.2 kg)  12/17/19 204 lb (92.5 kg)     Health Maintenance Due  Topic Date Due  . COVID-19 Vaccine (1) Never done  . Pneumococcal Vaccine 69-95 Years old (1 of 2 - PPSV23) Never done  . OPHTHALMOLOGY EXAM  Never done  . HIV Screening  Never done  . COLONOSCOPY (Pts 45-62yr Insurance coverage will need to be confirmed)  Never done  . Zoster Vaccines- Shingrix (1 of 2) Never done    There are no preventive care reminders to display for this patient.  No results found for: TSH Lab Results  Component Value Date   WBC 15.3 (H) 03/29/2020   HGB 13.3 03/29/2020   HCT 40.3 03/29/2020   MCV 89.0 03/29/2020   PLT 468 (H) 03/29/2020   Lab Results  Component Value Date   NA 138 03/29/2020   K 3.8 03/29/2020   CO2 26 03/29/2020   GLUCOSE 240 (H) 03/29/2020   BUN 9 03/29/2020   CREATININE 1.20 03/29/2020   BILITOT 0.5 03/29/2020   ALKPHOS 87 03/29/2020   AST 22 03/29/2020   ALT 36 03/29/2020   PROT 6.9 03/29/2020   ALBUMIN 3.5 03/29/2020   CALCIUM 9.3 03/29/2020   ANIONGAP 9 03/29/2020   Lab Results  Component Value Date   CHOL 111 07/13/2020   Lab Results  Component Value Date   HDL 30 (L) 07/13/2020   Lab Results  Component Value Date   LDLCALC  57 07/13/2020   Lab Results  Component Value Date   TRIG 134 07/13/2020   Lab Results  Component Value Date   CHOLHDL 3.7 07/13/2020   Lab Results  Component Value Date   HGBA1C 7.5 (A) 09/22/2020      Assessment & Plan:   Problem List Items Addressed This Visit      Cardiovascular and Mediastinum   Essential hypertension   Relevant Medications   atorvastatin (LIPITOR) 10 MG tablet   lisinopril-hydrochlorothiazide (ZESTORETIC) 10-12.5 MG tablet     Endocrine   Type 2 diabetes mellitus without complication, without  long-term current use of insulin (HCC) - Primary   Relevant Medications   atorvastatin (LIPITOR) 10 MG tablet   empagliflozin (JARDIANCE) 10 MG TABS tablet   lisinopril-hydrochlorothiazide (ZESTORETIC) 10-12.5 MG tablet   metFORMIN (GLUCOPHAGE) 1000 MG tablet   Blood Glucose Monitoring Suppl (ACCU-CHEK GUIDE ME) w/Device KIT   glucose blood (ACCU-CHEK GUIDE) test strip   Accu-Chek Softclix Lancets lancets   Other Relevant Orders   HgB A1c (Completed)     Other   Hyperlipidemia   Relevant Medications   atorvastatin (LIPITOR) 10 MG tablet   lisinopril-hydrochlorothiazide (ZESTORETIC) 10-12.5 MG tablet   Thickened nails   Relevant Orders   Ambulatory referral to Podiatry   Insomnia   Relevant Medications   traZODone (DESYREL) 50 MG tablet    1. Type 2 diabetes mellitus without complication, without long-term current use of insulin (HCC) A1c 7.5.  Continue current regimen, patient encouraged to check blood glucose levels at home, keep a written log and have available for all office visits.  Patient education given on low sugar diet.  Patient was given an appointment to follow-up with primary care provider at [facility] in 3 months. - HgB A1c - empagliflozin (JARDIANCE) 10 MG TABS tablet; Take 1 tablet (10 mg total) by mouth daily before breakfast.  Dispense: 90 tablet; Refill: 0 - metFORMIN (GLUCOPHAGE) 1000 MG tablet; Take 1 tablet (1,000 mg total) by mouth 2 (two) times daily with a meal.  Dispense: 180 tablet; Refill: 3 - Blood Glucose Monitoring Suppl (ACCU-CHEK GUIDE ME) w/Device KIT; 1 each by Does not apply route 2 (two) times daily. Use to check FSBS twice a day. Dx: E11.9  Dispense: 1 kit; Refill: 0 - glucose blood (ACCU-CHEK GUIDE) test strip; Use as instructed  Dispense: 50 strip; Refill: 7 - Accu-Chek Softclix Lancets lancets; Use as instructed  Dispense: 100 each; Refill: 12  2. Hyperlipidemia, unspecified hyperlipidemia type Continue current regimen.  No  fasting labs required today - atorvastatin (LIPITOR) 10 MG tablet; Take 1 tablet (10 mg total) by mouth daily.  Dispense: 90 tablet; Refill: 1  3. Essential hypertension Continue current regimen.  Patient encouraged to check blood pressure at home, keep a written log and have available for all office visits. - lisinopril-hydrochlorothiazide (ZESTORETIC) 10-12.5 MG tablet; Take 1 tablet by mouth daily.  Dispense: 90 tablet; Refill: 1  4. Thickened nails  - Ambulatory referral to Podiatry  5. Insomnia, unspecified type Trial trazodone.  Patient education given on good sleep hygiene. - traZODone (DESYREL) 50 MG tablet; Take 0.5-1 tablets (25-50 mg total) by mouth at bedtime as needed for sleep.  Dispense: 30 tablet; Refill: 3    I have reviewed the patient's medical history (PMH, PSH, Social History, Family History, Medications, and allergies) , and have been updated if relevant. I spent 32 minutes reviewing chart and  face to face time with patient.  Meds ordered this encounter  Medications  . traZODone (DESYREL) 50 MG tablet    Sig: Take 0.5-1 tablets (25-50 mg total) by mouth at bedtime as needed for sleep.    Dispense:  30 tablet    Refill:  3    Order Specific Question:   Supervising Provider    Answer:   Joya Gaskins, PATRICK E [[id number]]  . atorvastatin (LIPITOR) 10 MG tablet    Sig: Take 1 tablet (10 mg total) by mouth daily.    Dispense:  90 tablet    Refill:  1    Order Specific Question:   Supervising Provider    Answer:   Joya Gaskins, PATRICK E [[id number]]  . empagliflozin (JARDIANCE) 10 MG TABS tablet    Sig: Take 1 tablet (10 mg total) by mouth daily before breakfast.    Dispense:  90 tablet    Refill:  0    Order Specific Question:   Supervising Provider    Answer:   Asencion Noble E [[id number]]  . hydrOXYzine (ATARAX/VISTARIL) 25 MG tablet    Sig: Take 1 tablet (25 mg total) by mouth every 6 (six) hours.    Dispense:  120 tablet    Refill:  2    Order Specific Question:    Supervising Provider    Answer:   Joya Gaskins, PATRICK E [[id number]]  . lisinopril-hydrochlorothiazide (ZESTORETIC) 10-12.5 MG tablet    Sig: Take 1 tablet by mouth daily.    Dispense:  90 tablet    Refill:  1    Order Specific Question:   Supervising Provider    Answer:   Joya Gaskins, PATRICK E [[id number]]  . metFORMIN (GLUCOPHAGE) 1000 MG tablet    Sig: Take 1 tablet (1,000 mg total) by mouth 2 (two) times daily with a meal.    Dispense:  180 tablet    Refill:  3    Order Specific Question:   Supervising Provider    Answer:   Asencion Noble E [[id number]]  . Blood Glucose Monitoring Suppl (ACCU-CHEK GUIDE ME) w/Device KIT    Sig: 1 each by Does not apply route 2 (two) times daily. Use to check FSBS twice a day. Dx: E11.9    Dispense:  1 kit    Refill:  0    Order Specific Question:   Supervising Provider    Answer:   Joya Gaskins, PATRICK E [[id number]]  . glucose blood (ACCU-CHEK GUIDE) test strip    Sig: Use as instructed    Dispense:  50 strip    Refill:  7    Order Specific Question:   Supervising Provider    Answer:   Asencion Noble E [[id number]]  . Accu-Chek Softclix Lancets lancets    Sig: Use as instructed    Dispense:  100 each    Refill:  12    Order Specific Question:   Supervising Provider    Answer:   Elsie Stain [[id number]]    Follow-up: Return if symptoms worsen or fail to improve.    Loraine Grip Mayers, PA-C

## 2020-09-22 NOTE — Progress Notes (Signed)
Patient forgot to take medication today. Patient has eaten today. Patient denies pain at this time. Patient request DM refills.

## 2020-09-22 NOTE — Therapy (Signed)
[facility] [address] [place], [place], [address] Phone: [phone]   Fax:  [phone]  Physical Therapy Treatment  Patient Details  Name: Curtis Williamson MRN: [id number] Date of Birth: 01-Nov-1960 Referring Provider (PT): Marlou Sa, MD   Encounter Date: 09/22/2020   PT End of Session - 09/22/20 0841    Visit Number 11    Number of Visits 18    Date for PT Re-Evaluation 10/16/20    Authorization Type workers comp,[employer] is case Freight forwarder. Jearld Pies for PT in person 8 visits to start. phone is [phone], Patient relays 12 more have been authorized    Authorization - Visit Number 11    Authorization - Number of Visits 20    Progress Note Due on Visit 20    PT Start Time 0805    PT Stop Time 0852    PT Time Calculation (min) 47 min    Activity Tolerance Patient limited by pain    Behavior During Therapy [facility] for tasks assessed/performed           Past Medical History:  Diagnosis Date  . Chronic kidney disease    only has one kidney  . Diabetes mellitus without complication ([place])   . Essential hypertension   . Hyperlipidemia   . Hypertension    Phreesia 01/13/2020    Past Surgical History:  Procedure Laterality Date  . KIDNEY DONATION    . MANDIBLE FRACTURE SURGERY    . ROTATOR CUFF REPAIR Right     There were no vitals filed for this visit.   Subjective Assessment - 09/22/20 0822    Subjective Pt reporting his back pain is a little better but 8/10 pain in his Rt hip, his shoulder does not feel like it is getting that much better because he is too much pain from his hips to do many of the exercises.    Pertinent History PMH: Rt RTC repair 07/06/20,CKD(only has one kidney, DM,HTN)    Limitations Lifting;House hold activities    Patient Stated Goals To improve the use of his L shoulder    Pain Onset More than a month ago    Pain Onset More than a month ago             [facility] Adult PT Treatment/Exercise - 09/22/20  0001      Lumbar Exercises: Aerobic   Nustep --   unable today due to hip pain     Shoulder Exercises: Seated   Other Seated Exercises Lt shoulder ER and IR green X10    Other Seated Exercises Rows and extensions with green X20 ea      Shoulder Exercises: ROM/Strengthening   UBE (Upper Arm Bike) LVl 3 3 mins fwd/back each way      Shoulder Exercises: Stretch   Other Shoulder Stretches table stretch 5 sec X 10 reps for Lt shoulder flexion, abd, ER      Vasopneumatic   Number Minutes Vasopneumatic  15 minutes    Vasopnuematic Location  Shoulder    Vasopneumatic Pressure Medium    Vasopneumatic Temperature  34      Manual Therapy   Manual therapy comments Grade 3 GH mobs distraction and inferior in fleixon, scaption and then A-P in ER, Lt shoulder PROM to tolerance all planes                    PT Short Term Goals - 09/09/20 0939      PT SHORT TERM GOAL #1  Title Pt will be Ind in an initial HEP    Status Achieved      PT SHORT TERM GOAL #2   Title Pt will voice understanding of messures to assist in pain reduction    Status Achieved      PT SHORT TERM GOAL #3   Title Patient will increase pain free shoulder AROM flexion and abduciton to 100 degrees    Baseline 95 deg now    Status Partially Met             PT Long Term Goals - 09/09/20 0941      PT LONG TERM GOAL #1   Title Pt will be Ind in a final HEP to maintain or progress achieved LOF    Baseline -    Status On-going      PT LONG TERM GOAL #2   Title Increase L shoulder strength  to 4+-5/5 for improved functional 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    Status On-going      PT LONG TERM GOAL #4   Title Pt's L shoulder will decreased to less than 3/10 c household activites and  drivng a truck.    Status On-going                 Plan - 09/22/20 0843    Clinical Impression Statement Sessions continued to be limited by his back and hip pain so we are unable to do standing activity/exercises. We are attempting to progress his overall Lt shoulder ROM,strength and function but this is progressing slowly. He does get some pain relief with vasopnuematic so this was continued today at the end of session.    Personal Factors and Comorbidities Comorbidity 1;Profession    Comorbidities PMH: Rt RTC repair 07/06/20,CKD(only has one kidney, DM,HTN)    Examination-Activity Limitations Carry;Lift;Reach Overhead    Examination-Participation Restrictions Occupation    Stability/Clinical Decision Making Evolving/Moderate complexity    Rehab Potential Good    PT Frequency 3x / week    PT Duration 8 weeks    PT Treatment/Interventions ADLs/Self Care Home Management;Cryotherapy;Electrical Stimulation;Ultrasound;Traction;Moist Heat;Iontophoresis 61m/ml Dexamethasone;Functional mobility training;Therapeutic activities;Therapeutic exercise;Balance training;Manual techniques;Patient/family education;Passive range of motion;Dry needling;Taping;Vasopneumatic Device;Joint Manipulations;Neuromuscular re-education    PT Next Visit Plan Continue wth ROM focus then light strength. Continue manual and ther ex to progress end range mobility in all directions, specifically ER. Check to see if we have referral for back also    PT Home Exercise Plan Access Code: [email or web address]   Consulted and Agree with Plan of Care Patient           Patient will benefit from skilled therapeutic intervention in order to improve the following deficits and impairments:  Decreased strength,Pain,Impaired UE functional use,Decreased range of motion,Decreased activity tolerance,Decreased mobility,Increased edema,Increased muscle spasms  Visit Diagnosis: Acute pain of left shoulder  Stiffness of left shoulder, not  elsewhere classified  Muscle weakness (generalized)  Localized edema     Problem List Patient Active Problem List   Diagnosis Date Noted  . Acute pain of left shoulder 05/19/2020  . Tobacco dependence 03/26/2019  . Essential hypertension 03/26/2019  . Type 2 diabetes mellitus without complication, without long-term current use of insulin ([place] 03/26/2019  .  Aneurysm, cerebral, nonruptured 03/26/2019    Silvestre Mesi 09/22/2020, 8:46 AM  [facility] [address] [place], [place], [address] Phone: [phone]   Fax:  [phone]  Name: Curtis Williamson MRN: [id number] Date of Birth: [birth year]

## 2020-09-22 NOTE — Patient Instructions (Signed)
I sent a prescription for a new meter to your pharmacy.  For your insomnia, you will do a trial of trazodone 25 mg to 50 mg at bedtime.  I do encourage you to have a quiet dark cold environment, if you require noise, I do encourage it to be a white noise such as a fan.  We have created an appointment for you to be seen at [facility] in 3 months for your health maintenance.  I did start a referral for you to be seen by podiatry to help you with your toenails.  Please let us know if there is anything else we can do for you  Roney Jaffe, PA-C Physician Assistant [facility] [email or web address]   Insomnia Insomnia is a sleep disorder that makes it difficult to fall asleep or stay asleep. Insomnia can cause fatigue, low energy, difficulty concentrating, mood swings, and poor performance at work or school. There are three different ways to classify insomnia:  Difficulty falling asleep.  Difficulty staying asleep.  Waking up too early in the morning. Any type of insomnia can be long-term (chronic) or short-term (acute). Both are common. Short-term insomnia usually lasts for three months or less. Chronic insomnia occurs at least three times a week for longer than three months. What are the causes? Insomnia may be caused by another condition, situation, or substance, such as:  Anxiety.  Certain medicines.  Gastroesophageal reflux disease (GERD) or other gastrointestinal conditions.  Asthma or other breathing conditions.  Restless legs syndrome, sleep apnea, or other sleep disorders.  Chronic pain.  Menopause.  Stroke.  Abuse of alcohol, tobacco, or illegal drugs.  Mental health conditions, such as depression.  Caffeine.  Neurological disorders, such as Alzheimer's disease.  An overactive thyroid (hyperthyroidism). Sometimes, the cause of insomnia may not be known. What increases the risk? Risk  factors for insomnia include:  Gender. Women are affected more often than men.  Age. Insomnia is more common as you get older.  Stress.  Lack of exercise.  Irregular work schedule or working night shifts.  Traveling between different time zones.  Certain medical and mental health conditions. What are the signs or symptoms? If you have insomnia, the main symptom is having trouble falling asleep or having trouble staying asleep. This may lead to other symptoms, such as:  Feeling fatigued or having low energy.  Feeling nervous about going to sleep.  Not feeling rested in the morning.  Having trouble concentrating.  Feeling irritable, anxious, or depressed. How is this diagnosed? This condition may be diagnosed based on:  Your symptoms and medical history. Your health care provider may ask about: ? Your sleep habits. ? Any medical conditions you have. ? Your mental health.  A physical exam. How is this treated? Treatment for insomnia depends on the cause. Treatment may focus on treating an underlying condition that is causing insomnia. Treatment may also include:  Medicines to help you sleep.  Counseling or therapy.  Lifestyle adjustments to help you sleep better. Follow these instructions at home: Eating and drinking  Limit or avoid alcohol, caffeinated beverages, and cigarettes, especially close to bedtime. These can disrupt your sleep.  Do not eat a large meal or eat spicy foods right before bedtime. This can lead to digestive discomfort that can make it hard for you to sleep.   Sleep habits  Keep a sleep diary to help you and your health care provider figure out what could be causing your insomnia. Write  down: ? When you sleep. ? When you wake up during the night. ? How well you sleep. ? How rested you feel the next day. ? Any side effects of medicines you are taking. ? What you eat and drink.  Make your bedroom a dark, comfortable place where it is easy to  fall asleep. ? Put up shades or blackout curtains to block light from outside. ? Use a white noise machine to block noise. ? Keep the temperature cool.  Limit screen use before bedtime. This includes: ? Watching TV. ? Using your smartphone, tablet, or computer.  Stick to a routine that includes going to bed and waking up at the same times every day and night. This can help you fall asleep faster. Consider making a quiet activity, such as reading, part of your nighttime routine.  Try to avoid taking naps during the day so that you sleep better at night.  Get out of bed if you are still awake after 15 minutes of trying to sleep. Keep the lights down, but try reading or doing a quiet activity. When you feel sleepy, go back to bed.   General instructions  Take over-the-counter and prescription medicines only as told by your health care provider.  Exercise regularly, as told by your health care provider. Avoid exercise starting several hours before bedtime.  Use relaxation techniques to manage stress. Ask your health care provider to suggest some techniques that may work well for you. These may include: ? Breathing exercises. ? Routines to release muscle tension. ? Visualizing peaceful scenes.  Make sure that you drive carefully. Avoid driving if you feel very sleepy.  Keep all follow-up visits as told by your health care provider. This is important. Contact a health care provider if:  You are tired throughout the day.  You have trouble in your daily routine due to sleepiness.  You continue to have sleep problems, or your sleep problems get worse. Get help right away if:  You have serious thoughts about hurting yourself or someone else. If you ever feel like you may hurt yourself or others, or have thoughts about taking your own life, get help right away. You can go to your nearest emergency department or call:  Your local emergency services (911 in the [place]  A suicide crisis  helpline, such as the National Suicide Prevention Lifeline at 306 224 6495. This is open 24 hours a day. Summary  Insomnia is a sleep disorder that makes it difficult to fall asleep or stay asleep.  Insomnia can be long-term (chronic) or short-term (acute).  Treatment for insomnia depends on the cause. Treatment may focus on treating an underlying condition that is causing insomnia.  Keep a sleep diary to help you and your health care provider figure out what could be causing your insomnia. This information is not intended to replace advice given to you by your health care provider. Make sure you discuss any questions you have with your health care provider. Document Revised: 02/13/2020 Document Reviewed: 02/13/2020 Elsevier Patient Education  2021 [employer].

## 2020-09-23 ENCOUNTER — Ambulatory Visit: Payer: Medicaid Other | Admitting: Family Medicine

## 2020-09-24 ENCOUNTER — Other Ambulatory Visit: Payer: Self-pay

## 2020-09-24 ENCOUNTER — Ambulatory Visit (INDEPENDENT_AMBULATORY_CARE_PROVIDER_SITE_OTHER): Payer: Worker's Compensation | Admitting: Physical Therapy

## 2020-09-24 DIAGNOSIS — M25612 Stiffness of left shoulder, not elsewhere classified: Secondary | ICD-10-CM

## 2020-09-24 DIAGNOSIS — M6281 Muscle weakness (generalized): Secondary | ICD-10-CM | POA: Diagnosis not present

## 2020-09-24 DIAGNOSIS — R600 Localized edema: Secondary | ICD-10-CM

## 2020-09-24 DIAGNOSIS — M25512 Pain in left shoulder: Secondary | ICD-10-CM

## 2020-09-24 NOTE — Therapy (Signed)
[facility] [address] [place], [place], [address] Phone: [phone]   Fax:  [phone]  Physical Therapy Treatment  Patient Details  Name: Rylee Nuzum MRN: [id number] Date of Birth: [birth year] Referring Provider (PT): Marlou Sa, MD   Encounter Date: 09/24/2020   PT End of Session - 09/24/20 0853     Visit Number 12    Number of Visits 18    Date for PT Re-Evaluation 10/16/20    Authorization Type workers comp,[employer] is case Freight forwarder. Jearld Pies for PT in person 8 visits to start. phone is [phone], Patient relays 12 more have been authorized    Authorization - Visit Number 12    Authorization - Number of Visits 20    Progress Note Due on Visit 20    PT Start Time 0805    PT Stop Time 0900    PT Time Calculation (min) 55 min    Activity Tolerance Patient limited by pain    Behavior During Therapy [facility] for tasks assessed/performed             Past Medical History:  Diagnosis Date   Chronic kidney disease    only has one kidney   Diabetes mellitus without complication ([place])    Essential hypertension    Hyperlipidemia    Hypertension    Phreesia 01/13/2020    Past Surgical History:  Procedure Laterality Date   KIDNEY DONATION     MANDIBLE FRACTURE SURGERY     ROTATOR CUFF REPAIR Right     There were no vitals filed for this visit.   Subjective Assessment - 09/24/20 0831     Subjective Pt relays the pain is better overall in his shoulder and back, less than 5/10 overall. He relays he has not been able to do his shoulder exercises due to back pain.    Pertinent History PMH: Rt RTC repair 07/06/20,CKD(only has one kidney, DM,HTN)    Limitations Lifting;House hold activities    Patient Stated Goals To improve the use of his L shoulder    Pain Onset More than a month ago    Pain Onset More than a month ago                [facility] PT Assessment - 09/24/20 0001       Assessment   Medical Diagnosis Lt RTC  repair 07/06/20    Referring Provider (PT) Marlou Sa, MD    Next MD Visit 6/15      PROM   Left Shoulder Flexion 150 Degrees    Left Shoulder ABduction 130 Degrees    Left Shoulder Internal Rotation 65 Degrees    Left Shoulder External Rotation 50 Degrees                           [facility] Adult PT Treatment/Exercise - 09/24/20 0001       Lumbar Exercises: Stretches   Other Lumbar Stretch Exercise seated pball rollouts for lumbar and shoulder flexion stretch 10 sec X10      Lumbar Exercises: Aerobic   Nustep L5 X6 min for LE and UE      Shoulder Exercises: Seated   Other Seated Exercises seated on pball for bilat ER, rows, and extensions green X20 ea (attempted H abd but too difficutl)    Other Seated Exercises seated on pball bilat shoulder flexion 2# X10, bilat shoulder abd 1# X10      Shoulder Exercises: Pulleys   Flexion  2 minutes    ABduction 2 minutes      Shoulder Exercises: Stretch   Other Shoulder Stretches table stretch 5 sec X 10 reps for Lt shoulder  ER      Vasopneumatic   Number Minutes Vasopneumatic  15 minutes    Vasopnuematic Location  Shoulder    Vasopneumatic Pressure Medium    Vasopneumatic Temperature  34      Manual Therapy   Manual therapy comments Grade 3 GH mobs distraction and inferior in fleixon, scaption and then A-P in ER, Lt shoulder PROM to tolerance all planes                      PT Short Term Goals - 09/09/20 0939       PT SHORT TERM GOAL #1   Title Pt will be Ind in an initial HEP    Status Achieved      PT SHORT TERM GOAL #2   Title Pt will voice understanding of messures to assist in pain reduction    Status Achieved      PT SHORT TERM GOAL #3   Title Patient will increase pain free shoulder AROM flexion and abduciton to 100 degrees    Baseline 95 deg now    Status Partially Met               PT Long Term Goals - 09/09/20 0941       PT LONG TERM GOAL #1   Title Pt will be Ind in a final HEP  to maintain or progress achieved LOF    Baseline -    Status On-going      PT LONG TERM GOAL #2   Title Increase L shoulder strength  to 4+-5/5 for improved functional 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    Status On-going      PT LONG TERM GOAL #4   Title Pt's L shoulder will decreased to less than 3/10 c household activites and drivng a truck.    Status On-going                   Plan - 09/24/20 0854     Clinical Impression Statement Back pain has improved some, progressed his shoulder strength today gradually to his tolerance. PROM measurments still limited and he was encouraged to stretch more at home and he verbalizes understanding.    Personal Factors and Comorbidities Comorbidity 1;Profession    Comorbidities PMH: Rt RTC repair 07/06/20,CKD(only has one kidney, DM,HTN)    Examination-Activity Limitations Carry;Lift;Reach Overhead    Examination-Participation Restrictions Occupation    Stability/Clinical Decision Making Evolving/Moderate complexity    Rehab Potential Good    PT Frequency 3x / week    PT Duration 8 weeks    PT Treatment/Interventions ADLs/Self Care Home Management;Cryotherapy;Electrical Stimulation;Ultrasound;Traction;Moist Heat;Iontophoresis 23m/ml Dexamethasone;Functional mobility training;Therapeutic activities;Therapeutic exercise;Balance training;Manual techniques;Patient/family education;Passive range of motion;Dry needling;Taping;Vasopneumatic Device;Joint Manipulations;Neuromuscular re-education    PT Next Visit Plan Continue wth ROM focus then light strength. Continue manual and ther ex to progress end range mobility in all directions, specifically ER.    PT Home Exercise Plan Access Code: [email or web address]    [email or web address] Agree with  Plan of  Care Patient             Patient will benefit from skilled therapeutic intervention in order to improve the following deficits and impairments:  Decreased strength, Pain, Impaired UE functional use, Decreased range of motion, Decreased activity tolerance, Decreased mobility, Increased edema, Increased muscle spasms  Visit Diagnosis: Acute pain of left shoulder  Stiffness of left shoulder, not elsewhere classified  Muscle weakness (generalized)  Localized edema     Problem List Patient Active Problem List   Diagnosis Date Noted   Hyperlipidemia 09/22/2020   Thickened nails 09/22/2020   Insomnia 09/22/2020   Acute pain of left shoulder 05/19/2020   Tobacco dependence 03/26/2019   Essential hypertension 03/26/2019   Type 2 diabetes mellitus without complication, without long-term current use of insulin ([place]) 03/26/2019   Aneurysm, cerebral, nonruptured 03/26/2019    Silvestre Mesi 09/24/2020, 9:00 AM  [facility] [address] [place], [place], [address] Phone: [phone]   Fax:  [phone]  Name: Feliberto Stockley MRN: [id number] Date of Birth: Sep 15, 1960

## 2020-09-28 ENCOUNTER — Other Ambulatory Visit: Payer: Self-pay

## 2020-09-28 ENCOUNTER — Encounter: Payer: Self-pay | Admitting: Rehabilitative and Restorative Service Providers"

## 2020-09-28 ENCOUNTER — Ambulatory Visit (INDEPENDENT_AMBULATORY_CARE_PROVIDER_SITE_OTHER): Payer: Worker's Compensation | Admitting: Rehabilitative and Restorative Service Providers"

## 2020-09-28 DIAGNOSIS — M25512 Pain in left shoulder: Secondary | ICD-10-CM

## 2020-09-28 DIAGNOSIS — M25612 Stiffness of left shoulder, not elsewhere classified: Secondary | ICD-10-CM | POA: Diagnosis not present

## 2020-09-28 DIAGNOSIS — R600 Localized edema: Secondary | ICD-10-CM | POA: Diagnosis not present

## 2020-09-28 DIAGNOSIS — M6281 Muscle weakness (generalized): Secondary | ICD-10-CM | POA: Diagnosis not present

## 2020-09-28 NOTE — Therapy (Signed)
[facility] [address] [place], [place], [address] Phone: [phone]   Fax:  [phone]  Physical Therapy Treatment  Patient Details  Name: Curtis Williamson MRN: [id number] Date of Birth: 1960/10/28 Referring Provider (PT): Marlou Sa, MD   Encounter Date: 09/28/2020   PT End of Session - 09/28/20 0814     Visit Number 13    Number of Visits 18    Date for PT Re-Evaluation 10/16/20    Authorization Type workers comp,[employer] is case Freight forwarder. Jearld Pies for PT in person 8 visits to start. phone is [phone], Patient relays 12 more have been authorized    Authorization - Visit Number 13    Authorization - Number of Visits 20    Progress Note Due on Visit 20    PT Start Time 0807    PT Stop Time 0845    PT Time Calculation (min) 38 min    Activity Tolerance Patient limited by pain    Behavior During Therapy [facility] for tasks assessed/performed             Past Medical History:  Diagnosis Date   Chronic kidney disease    only has one kidney   Diabetes mellitus without complication ([place])    Essential hypertension    Hyperlipidemia    Hypertension    Phreesia 01/13/2020    Past Surgical History:  Procedure Laterality Date   KIDNEY DONATION     MANDIBLE FRACTURE SURGERY     ROTATOR CUFF REPAIR Right     There were no vitals filed for this visit.   Subjective Assessment - 09/28/20 0813     Subjective Pt. indicated normal pain today in shoulder, 2-3/10 or so.    Pertinent History PMH: Rt RTC repair 07/06/20,CKD(only has one kidney, DM,HTN)    Limitations Lifting;House hold activities    Patient Stated Goals To improve the use of his L shoulder    Currently in Pain? Yes    Pain Location Shoulder    Pain Orientation Left    Pain Descriptors / Indicators Aching;Sore    Pain Type Surgical pain    Pain Onset More than a month ago    Pain Frequency Constant    Aggravating Factors  going back with arm, going up with its own  power    Pain Relieving Factors resting, medicine    Pain Onset More than a month ago                               [facility] Adult PT Treatment/Exercise - 09/28/20 0001       Shoulder Exercises: Seated   Other Seated Exercises seated on pball, tband rows, gh ext x 20 each, ER c arm at side with towel 20x slow eccentric focus Lt arm, IR c towel at side 20 x   Green tband     Shoulder Exercises: Sidelying   Flexion Left   2 x 10   ABduction Left   2 x 10 1 lb   ABduction Weight (lbs) 1      Shoulder Exercises: Standing   Other Standing Exercises finger ladder flexion 5 sec hold c slow lowering on wall x 10      Shoulder Exercises: Pulleys   Flexion 2 minutes    ABduction 2 minutes      Shoulder Exercises: Stretch   Other Shoulder Stretches table ER stretch 15 sec x 5 Lt arm  Manual Therapy   Manual therapy comments grade 3 inferior joint mobs Lt GH joint in flexion, scaption and abduction.  AP mobs c movement in ER in 90 degrees abduction                      PT Short Term Goals - 09/09/20 0939       PT SHORT TERM GOAL #1   Title Pt will be Ind in an initial HEP    Status Achieved      PT SHORT TERM GOAL #2   Title Pt will voice understanding of messures to assist in pain reduction    Status Achieved      PT SHORT TERM GOAL #3   Title Patient will increase pain free shoulder AROM flexion and abduciton to 100 degrees    Baseline 95 deg now    Status Partially Met               PT Long Term Goals - 09/09/20 0941       PT LONG TERM GOAL #1   Title Pt will be Ind in a final HEP to maintain or progress achieved LOF    Baseline -    Status On-going      PT LONG TERM GOAL #2   Title Increase L shoulder strength  to 4+-5/5 for improved functional 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    Baseline Gorssly 3-/5    Status On-going      PT LONG TERM GOAL #3   Title Increase L shoulder ROM to functional levels to  complete household activites and 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    Status On-going      PT LONG TERM GOAL #4   Title Pt's L shoulder will decreased to less than 3/10 c household activites and drivng a truck.    Status On-going                   Plan - 09/28/20 0835     Clinical Impression Statement Presentation today indicated ER mobility as most limited active/passive movement as well as strength deficits in gravity resisted positioning.  Inclusion of sidelying gravity reduced posiitoning to improve movement pattern/strength without compensatory movements as noted against gravity.    Personal Factors and Comorbidities Comorbidity 1;Profession    Comorbidities PMH: Rt RTC repair 07/06/20,CKD(only has one kidney, DM,HTN)    Examination-Activity Limitations Carry;Lift;Reach Overhead    Examination-Participation Restrictions Occupation    Stability/Clinical Decision Making Evolving/Moderate complexity    Rehab Potential Good    PT Frequency 3x / week    PT Duration 8 weeks    PT Treatment/Interventions ADLs/Self Care Home Management;Cryotherapy;Electrical Stimulation;Ultrasound;Traction;Moist Heat;Iontophoresis 61m/ml Dexamethasone;Functional mobility training;Therapeutic activities;Therapeutic exercise;Balance training;Manual techniques;Patient/family education;Passive range of motion;Dry needling;Taping;Vasopneumatic Device;Joint Manipulations;Neuromuscular re-education    PT Next Visit Plan ER mobility gains in manual/ther ex intervention  Active rom /strengthening in gravity reduced positioning to improve strength without compensatory movements with transition to standing as tolerated.    PT Home Exercise Plan Access Code: [email or web address]   Consulted and Agree with Plan of Care Patient             Patient will benefit from skilled therapeutic intervention in order to improve the following deficits and impairments:  Decreased strength, Pain, Impaired UE functional use,  Decreased range of motion, Decreased activity tolerance, Decreased mobility, Increased edema, Increased muscle spasms  Visit Diagnosis: Acute pain of left  shoulder  Stiffness of left shoulder, not elsewhere classified  Muscle weakness (generalized)  Localized edema     Problem List Patient Active Problem List   Diagnosis Date Noted   Hyperlipidemia 09/22/2020   Thickened nails 09/22/2020   Insomnia 09/22/2020   Acute pain of left shoulder 05/19/2020   Tobacco dependence 03/26/2019   Essential hypertension 03/26/2019   Type 2 diabetes mellitus without complication, without long-term current use of insulin ([place]) 03/26/2019   Aneurysm, cerebral, nonruptured 03/26/2019   Scot Jun, PT, DPT, OCS, ATC 09/28/20  8:40 AM    [facility] [address] [place], [place], [address] Phone: [phone]   Fax:  [phone]  Name: Curtis Williamson MRN: [id number] Date of Birth: 04/30/1960

## 2020-09-30 ENCOUNTER — Ambulatory Visit (INDEPENDENT_AMBULATORY_CARE_PROVIDER_SITE_OTHER): Payer: Worker's Compensation | Admitting: Physical Therapy

## 2020-09-30 ENCOUNTER — Other Ambulatory Visit: Payer: Self-pay

## 2020-09-30 ENCOUNTER — Ambulatory Visit: Payer: Medicaid Other | Admitting: Orthopedic Surgery

## 2020-09-30 DIAGNOSIS — M6281 Muscle weakness (generalized): Secondary | ICD-10-CM | POA: Diagnosis not present

## 2020-09-30 DIAGNOSIS — R600 Localized edema: Secondary | ICD-10-CM

## 2020-09-30 DIAGNOSIS — M25612 Stiffness of left shoulder, not elsewhere classified: Secondary | ICD-10-CM | POA: Diagnosis not present

## 2020-09-30 DIAGNOSIS — M25512 Pain in left shoulder: Secondary | ICD-10-CM

## 2020-09-30 NOTE — Therapy (Signed)
[facility] [address] [place], [place], [address] Phone: [phone]   Fax:  [phone]  Physical Therapy Treatment  Patient Details  Name: Nikolai Wilczak MRN: [id number] Date of Birth: 03/29/1961 Referring Provider (PT): Marlou Sa, MD   Encounter Date: 09/30/2020   PT End of Session - 09/30/20 0908     Visit Number 14    Number of Visits 18    Date for PT Re-Evaluation 10/16/20    Authorization Type workers comp,[employer] is case Freight forwarder. Jearld Pies for PT in person 8 visits to start. phone is [phone], Patient relays 12 more have been authorized    Authorization - Visit Number 14    Authorization - Number of Visits 20    Progress Note Due on Visit 20    PT Start Time 0840    PT Stop Time 0930    PT Time Calculation (min) 50 min    Activity Tolerance Patient tolerated treatment well    Behavior During Therapy WFL for tasks assessed/performed             Past Medical History:  Diagnosis Date   Chronic kidney disease    only has one kidney   Diabetes mellitus without complication ([place])    Essential hypertension    Hyperlipidemia    Hypertension    Phreesia 01/13/2020    Past Surgical History:  Procedure Laterality Date   KIDNEY DONATION     MANDIBLE FRACTURE SURGERY     ROTATOR CUFF REPAIR Right     There were no vitals filed for this visit.   Subjective Assessment - 09/30/20 0900     Subjective Pt. relays last night he had a lot of pain in his hip and shoulder for reasons unknown but indicated normal pain today in shoulder, 2-3/10    Pertinent History PMH: Rt RTC repair 07/06/20,CKD(only has one kidney, DM,HTN)    Limitations Lifting;House hold activities    Patient Stated Goals To improve the use of his L shoulder    Pain Onset More than a month ago    Pain Onset More than a month ago              [facility] Adult PT Treatment/Exercise - 09/30/20 0001       Shoulder Exercises: Seated   Other Seated  Exercises seated rows, extensions IR, and ER green X20 ea    Other Seated Exercises seated ER stretch on table 10 sec X10      Shoulder Exercises: Sidelying   ABduction Left;10 reps   2 sets   ABduction Weight (lbs) 1      Shoulder Exercises: Pulleys   Flexion 2 minutes    ABduction 2 minutes      Shoulder Exercises: ROM/Strengthening   UBE (Upper Arm Bike) L3 2.5 min fwd/2.5 min reverse      Vasopneumatic   Number Minutes Vasopneumatic  15 minutes    Vasopnuematic Location  Shoulder    Vasopneumatic Pressure Medium    Vasopneumatic Temperature  34      Manual Therapy   Manual therapy comments grade 3 inferior joint mobs Lt GH joint in flexion, scaption and abduction.  AP mobs c movement in ER in 90 degrees abduction                      PT Short Term Goals - 09/09/20 0939       PT SHORT TERM GOAL #1   Title Pt will be Ind  in an initial HEP    Status Achieved      PT SHORT TERM GOAL #2   Title Pt will voice understanding of messures to assist in pain reduction    Status Achieved      PT SHORT TERM GOAL #3   Title Patient will increase pain free shoulder AROM flexion and abduciton to 100 degrees    Baseline 95 deg now    Status Partially Met               PT Long Term Goals - 09/09/20 0941       PT LONG TERM GOAL #1   Title Pt will be Ind in a final HEP to maintain or progress achieved LOF    Baseline -    Status On-going      PT LONG TERM GOAL #2   Title Increase L shoulder strength  to 4+-5/5 for improved functional 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    Status On-going      PT LONG TERM GOAL #4   Title Pt's L shoulder will decreased to less than 3/10 c household activites and drivng a truck.    Status On-going                    Plan - 09/30/20 0915     Clinical Impression Statement His ROM is slowly improving but still with deficits so again focused on this with light strengthening. He shows good effort with PT exercises today. Continue POC    Personal Factors and Comorbidities Comorbidity 1;Profession    Comorbidities PMH: Rt RTC repair 07/06/20,CKD(only has one kidney, DM,HTN)    Examination-Activity Limitations Carry;Lift;Reach Overhead    Examination-Participation Restrictions Occupation    Stability/Clinical Decision Making Evolving/Moderate complexity    Rehab Potential Good    PT Frequency 3x / week    PT Duration 8 weeks    PT Treatment/Interventions ADLs/Self Care Home Management;Cryotherapy;Electrical Stimulation;Ultrasound;Traction;Moist Heat;Iontophoresis 21m/ml Dexamethasone;Functional mobility training;Therapeutic activities;Therapeutic exercise;Balance training;Manual techniques;Patient/family education;Passive range of motion;Dry needling;Taping;Vasopneumatic Device;Joint Manipulations;Neuromuscular re-education    PT Next Visit Plan ER mobility gains in manual/ther ex intervention  Active rom /strengthening in gravity reduced positioning to improve strength without compensatory movements with transition to standing as tolerated.    PT Home Exercise Plan Access Code: [email or web address]   Consulted and Agree with Plan of Care Patient             Patient will benefit from skilled therapeutic intervention in order to improve the following deficits and impairments:  Decreased strength, Pain, Impaired UE functional use, Decreased range of motion, Decreased activity tolerance, Decreased mobility, Increased edema, Increased muscle spasms  Visit Diagnosis: Acute pain of left shoulder  Stiffness of left shoulder, not elsewhere classified  Muscle weakness (generalized)  Localized edema     Problem List Patient Active Problem List   Diagnosis Date Noted   Hyperlipidemia 09/22/2020    Thickened nails 09/22/2020   Insomnia 09/22/2020   Acute pain of left shoulder 05/19/2020   Tobacco dependence 03/26/2019   Essential hypertension 03/26/2019   Type 2 diabetes mellitus without complication, without long-term current use of insulin ([place] 03/26/2019   Aneurysm, cerebral, nonruptured 03/26/2019  Silvestre Mesi 09/30/2020, 9:18 AM  [facility] [address] [place], [place], [address] Phone: [phone]   Fax:  [phone]  Name: Riordan Walle MRN: [id number] Date of Birth: 01/02/1961

## 2020-10-05 ENCOUNTER — Telehealth: Payer: Self-pay | Admitting: Physical Therapy

## 2020-10-05 ENCOUNTER — Encounter: Payer: Medicaid Other | Admitting: Physical Therapy

## 2020-10-05 NOTE — Telephone Encounter (Signed)
Pt no show for PT appointment today. He is usually at all of his appointments. I called to check on him and he states he was in too much pain and did not sleep last night so he was unable to get himself together to make it in today. They were provided the date and time of their next appointment and was instructed to call us to let us know if they cannot make their appointment.  Ivery Quale, PT, DPT 10/05/20 9:13 AM

## 2020-10-07 ENCOUNTER — Ambulatory Visit (INDEPENDENT_AMBULATORY_CARE_PROVIDER_SITE_OTHER): Payer: Worker's Compensation | Admitting: Physical Therapy

## 2020-10-07 ENCOUNTER — Other Ambulatory Visit: Payer: Self-pay

## 2020-10-07 DIAGNOSIS — M6281 Muscle weakness (generalized): Secondary | ICD-10-CM

## 2020-10-07 DIAGNOSIS — R600 Localized edema: Secondary | ICD-10-CM

## 2020-10-07 DIAGNOSIS — M25612 Stiffness of left shoulder, not elsewhere classified: Secondary | ICD-10-CM

## 2020-10-07 DIAGNOSIS — M25512 Pain in left shoulder: Secondary | ICD-10-CM | POA: Diagnosis not present

## 2020-10-07 NOTE — Therapy (Signed)
[facility] [address] 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 phone is [phone], Patient relays 12 more have been authorized    Authorization - Visit Number 15    Authorization - Number of Visits 20    Progress Note Due on Visit 20    PT Start Time 0812    PT Stop Time 0857    PT Time Calculation (min) 45 min    Activity Tolerance Patient tolerated treatment well    Behavior During Therapy [facility] for tasks assessed/performed             Past Medical History:  Diagnosis Date   Chronic kidney disease    only has one kidney   Diabetes mellitus without complication ([place])    Essential hypertension    Hyperlipidemia    Hypertension    Phreesia 01/13/2020    Past Surgical History:  Procedure Laterality Date   KIDNEY DONATION     MANDIBLE FRACTURE SURGERY     ROTATOR CUFF REPAIR Right     There were no vitals filed for this visit.   Subjective Assessment - 10/07/20 0818     Subjective Pt. relays pain is about 5-6 overall for his Rt shoulder    Pertinent History PMH: Rt RTC repair 07/06/20,CKD(only has one kidney, DM,HTN)    Limitations Lifting;House hold activities    Patient Stated Goals To improve the use of his L shoulder    Pain Onset More than a month ago    Pain Onset More than a month ago                [facility] PT Assessment - 10/07/20 0001       Assessment   Medical Diagnosis Lt RTC repair 07/06/20    Referring Provider (PT) Marlou Sa, MD    Next MD Visit 7/6      AROM   Left  Shoulder Flexion 100 Degrees    Left Shoulder ABduction 90 Degrees      PROM   Left Shoulder Flexion 150 Degrees    Left Shoulder ABduction 130 Degrees    Left Shoulder Internal Rotation 65 Degrees    Left Shoulder External Rotation 50 Degrees      Strength   Overall Strength Comments in his available ROM    Left Shoulder Flexion 4/5    Left Shoulder ABduction 3+/5    Left Shoulder Internal Rotation 4+/5    Left Shoulder External Rotation 4/5              [facility] Adult PT Treatment/Exercise - 10/07/20 0001       Shoulder Exercises: Supine   External Rotation Limitations attempted ER AAROM but relays this is too painful and prefers table stretch instead    Flexion AAROM;20 reps    Flexion Limitations 3# bar      Shoulder Exercises: Pulleys   Flexion 2 minutes    ABduction 2 minutes      Shoulder Exercises: ROM/Strengthening   UBE (Upper Arm Bike)  L3 2 min fwd and 2 min reverse      Shoulder Exercises: Stretch   Other Shoulder Stretches table ER stretch and abduction stretch 5 sec X15 ea      Vasopneumatic   Number Minutes Vasopneumatic  15 minutes    Vasopnuematic Location  Shoulder    Vasopneumatic Pressure Medium    Vasopneumatic Temperature  34      Manual Therapy   Manual therapy comments grade 3 inferior joint mobs Lt GH joint in flexion, scaption and abduction.  AP mobs c movement in ER in 90 degrees abduction. PROM all planes to Lt shoulder                      PT Short Term Goals - 09/09/20 0939       PT SHORT TERM GOAL #1   Title Pt will be Ind in an initial HEP    Status Achieved      PT SHORT TERM GOAL #2   Title Pt will voice understanding of messures to assist in pain reduction    Status Achieved      PT SHORT TERM GOAL #3   Title Patient will increase pain free shoulder AROM flexion and abduciton to 100 degrees    Baseline 95 deg now    Status Partially Met               PT Long Term Goals - 09/09/20 0941       PT LONG  TERM GOAL #1   Title Pt will be Ind in a final HEP to maintain or progress achieved LOF    Baseline -    Status On-going      PT LONG TERM GOAL #2   Title Increase L shoulder strength  to 4+-5/5 for improved functional 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    Status On-going      PT LONG TERM GOAL #4   Title Pt's L shoulder will decreased to less than 3/10 c household activites and drivng a truck.    Status On-going                   Plan - 10/07/20 8938     Clinical Impression Statement He has not gained significant ROM or strength since last updated meaurements and sessions have been more limited by overall pain. He will follow back up with MD on 10/21/20. We will attempt to progress him as he can tolerate in the mean time. He does at least get pain relief with vasopnuematic device at the end of sessions.    Personal Factors and Comorbidities Comorbidity 1;Profession    Comorbidities PMH: Rt RTC repair 07/06/20,CKD(only has one kidney, DM,HTN)    Examination-Activity Limitations Carry;Lift;Reach Overhead    Examination-Participation Restrictions Occupation    Stability/Clinical Decision Making Evolving/Moderate complexity    Rehab Potential Good    PT Frequency 3x / week    PT Duration 8 weeks    PT Treatment/Interventions ADLs/Self Care Home Management;Cryotherapy;Electrical Stimulation;Ultrasound;Traction;Moist Heat;Iontophoresis 59m/ml Dexamethasone;Functional mobility training;Therapeutic activities;Therapeutic exercise;Balance training;Manual techniques;Patient/family education;Passive range of motion;Dry needling;Taping;Vasopneumatic Device;Joint Manipulations;Neuromuscular re-education    PT Next Visit Plan Progress as tolerated. ER mobility gains in  manual/ther ex intervention  Active rom /strengthening in gravity reduced positioning to improve strength without compensatory movements.    PT Home Exercise Plan Access Code: [email or web address]    LWHKNZUDO and Agree with Plan of Care Patient             Patient will benefit from skilled therapeutic intervention in order to improve the following deficits and impairments:  Decreased strength, Pain, Impaired UE functional use, Decreased range of motion, Decreased activity tolerance, Decreased mobility, Increased edema, Increased muscle spasms  Visit Diagnosis: Acute pain of left shoulder  Stiffness of left shoulder, not elsewhere classified  Muscle weakness (generalized)  Localized edema     Problem List Patient Active Problem List   Diagnosis Date Noted   Hyperlipidemia 09/22/2020   Thickened nails 09/22/2020   Insomnia 09/22/2020   Acute pain of left shoulder 05/19/2020   Tobacco dependence 03/26/2019   Essential hypertension 03/26/2019   Type 2 diabetes mellitus without complication, without long-term current use of insulin ([place]) 03/26/2019   Aneurysm, cerebral, nonruptured 03/26/2019    Silvestre Mesi 10/07/2020, 8:47 AM  [facility] Therapy [address] [place], [place], [address] Phone: [phone]   Fax:  [phone]  Name: Curtis Williamson MRN: [id number] Date of Birth: [birth year]

## 2020-10-09 ENCOUNTER — Ambulatory Visit: Payer: No Typology Code available for payment source | Admitting: Podiatry

## 2020-10-09 ENCOUNTER — Encounter: Payer: Self-pay | Admitting: Podiatry

## 2020-10-09 ENCOUNTER — Other Ambulatory Visit: Payer: Self-pay

## 2020-10-09 DIAGNOSIS — M79675 Pain in left toe(s): Secondary | ICD-10-CM

## 2020-10-09 DIAGNOSIS — M79674 Pain in right toe(s): Secondary | ICD-10-CM

## 2020-10-09 DIAGNOSIS — B351 Tinea unguium: Secondary | ICD-10-CM | POA: Diagnosis not present

## 2020-10-09 DIAGNOSIS — E119 Type 2 diabetes mellitus without complications: Secondary | ICD-10-CM | POA: Diagnosis not present

## 2020-10-09 NOTE — Progress Notes (Signed)
This patient presents to the office with chief complaint of long thick nails and diabetic feet.  This patient  says there  is  no pain and discomfort in their feet.  This patient says there are long thick painful nails.  These nails are painful walking and wearing shoes.  Patient has no history of infection or drainage from both feet.  Patient is unable to  self treat his own nails . This patient presents  to the office today for treatment of the  long nails and a foot evaluation due to history of  diabetes.  General Appearance  Alert, conversant and in no acute stress.  Vascular  Dorsalis pedis and posterior tibial  pulses are palpable  bilaterally.  Capillary return is within normal limits  bilaterally. Temperature is within normal limits  bilaterally.  Neurologic  Senn-Weinstein monofilament wire test within normal limits  bilaterally. Muscle power within normal limits bilaterally.  Nails Thick disfigured discolored nails with subungual debris  from hallux to fifth toes bilaterally. No evidence of bacterial infection or drainage bilaterally.  Orthopedic  No limitations of motion of motion feet .  No crepitus or effusions noted.  No bony pathology or digital deformities noted.  HAV  B/L.  Skin  normotropic skin with no porokeratosis noted bilaterally.  No signs of infections or ulcers noted.     Onychomycosis  Diabetes with no foot complications  IE  Debride nails x 10.  A diabetic foot exam was performed and there is no evidence of any vascular or neurologic pathology.   RTC 3 months.   Helane Gunther DPM

## 2020-10-12 ENCOUNTER — Other Ambulatory Visit: Payer: Self-pay

## 2020-10-12 ENCOUNTER — Ambulatory Visit (INDEPENDENT_AMBULATORY_CARE_PROVIDER_SITE_OTHER): Payer: Worker's Compensation | Admitting: Rehabilitative and Restorative Service Providers"

## 2020-10-12 DIAGNOSIS — M25612 Stiffness of left shoulder, not elsewhere classified: Secondary | ICD-10-CM | POA: Diagnosis not present

## 2020-10-12 DIAGNOSIS — R600 Localized edema: Secondary | ICD-10-CM | POA: Diagnosis not present

## 2020-10-12 DIAGNOSIS — M6281 Muscle weakness (generalized): Secondary | ICD-10-CM

## 2020-10-12 DIAGNOSIS — M25512 Pain in left shoulder: Secondary | ICD-10-CM

## 2020-10-12 NOTE — Therapy (Signed)
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 phone is [phone], Patient relays 12 more have been authorized    Authorization - Visit Number 16    Authorization - Number of Visits 20    Progress Note Due on Visit 69    PT Start Time 0845    PT Stop Time 0935    PT Time Calculation (min) 50 min    Activity Tolerance Patient tolerated treatment well    Behavior During Therapy [facility] for tasks assessed/performed             Past Medical History:  Diagnosis Date   Chronic kidney disease    only has one kidney   Diabetes mellitus without complication ([place])    Essential hypertension    Hyperlipidemia    Hypertension    Phreesia 01/13/2020    Past Surgical History:  Procedure Laterality Date   KIDNEY DONATION     MANDIBLE FRACTURE SURGERY     ROTATOR CUFF REPAIR Right     There were no vitals filed for this visit.   Subjective Assessment - 10/12/20 0850     Subjective Pt. indicated shoulder is doing ok. "back is the problem"  indicating knot on Rt side of low back.  Massage helped for low back but still there some.    Pertinent History PMH: Rt RTC repair 07/06/20,CKD(only has one kidney, DM,HTN)    Limitations Lifting;House hold activities    Patient Stated Goals To improve the use of his L shoulder    Currently in Pain? Yes    Pain Score 4     Pain Location Shoulder    Pain Orientation Left    Pain Descriptors / Indicators Aching;Sore    Pain Type Surgical pain    Pain Onset  More than a month ago    Pain Frequency Intermittent    Aggravating Factors  lifting arm up, back    Pain Relieving Factors rest, medicine    Pain Onset More than a month ago                               [facility] Adult PT Treatment/Exercise - 10/12/20 0001       Shoulder Exercises: Supine   Horizontal ABduction Both   2 x 10   Theraband Level (Shoulder Horizontal ABduction) Level 3 (Green)    Flexion AAROM   2 x 10 3 lb bar     Shoulder Exercises: Sidelying   External Rotation Left   3 x 10 c towel at side   External Rotation Weight (lbs) 1    Flexion Left   2 x 10   ABduction Left   2 x 10   ABduction Weight (lbs) 1      Shoulder Exercises: Pulleys   Flexion 2 minutes    ABduction 2 minutes      Shoulder Exercises: ROM/Strengthening   UBE (Upper Arm Bike) Lvl 3.5  3 mins fwd/back each way      Vasopneumatic   Number Minutes Vasopneumatic  10 minutes    Vasopnuematic Location  Shoulder    Vasopneumatic Pressure Medium    Vasopneumatic Temperature  34      Manual Therapy   Manual therapy comments active compression to Lt upper trap for trigger points in superior shoulder, mobilization c movement to Lt shoulder c posterior glide and passive and active ER in 45 deg abduction                      PT Short Term Goals - 09/09/20 0939       PT SHORT TERM GOAL #1   Title Pt will be Ind in an initial HEP    Status Achieved      PT SHORT TERM GOAL #2   Title Pt will voice understanding of messures to assist in pain reduction    Status Achieved      PT SHORT TERM GOAL #3   Title Patient will increase pain free shoulder AROM flexion and abduciton to 100 degrees    Baseline 95 deg now    Status Partially Met               PT Long Term Goals - 09/09/20 0941       PT LONG TERM GOAL #1   Title Pt will be Ind in a final HEP to maintain or progress achieved LOF    Baseline -    Status On-going      PT LONG TERM GOAL #2   Title  Increase L shoulder strength  to 4+-5/5 for improved functional 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    Status On-going      PT LONG TERM GOAL #4   Title Pt's L shoulder will decreased to less than 3/10 c household activites and drivng a truck.    Status On-going                   Plan - 10/12/20 8657     Clinical Impression Statement Complaints of superior shoulder pain from Lt upper trap trigger points noted from compensatory movements noted.  ER mobility continued limited at this time in various degrees of abduction.  Tolerated scapular and sidelying strengthening with good tolerance today overall.    Personal Factors and Comorbidities Comorbidity 1;Profession    Comorbidities PMH: Rt RTC repair 07/06/20,CKD(only has one kidney, DM,HTN)    Examination-Activity Limitations Carry;Lift;Reach Overhead    Examination-Participation Restrictions Occupation    Stability/Clinical Decision Making Evolving/Moderate complexity    Rehab Potential Good    PT Frequency 3x / week    PT Duration 8 weeks    PT Treatment/Interventions ADLs/Self Care Home Management;Cryotherapy;Electrical Stimulation;Ultrasound;Traction;Moist Heat;Iontophoresis 27m/ml Dexamethasone;Functional mobility training;Therapeutic activities;Therapeutic exercise;Balance training;Manual techniques;Patient/family education;Passive range of motion;Dry needling;Taping;Vasopneumatic Device;Joint Manipulations;Neuromuscular re-education    PT Next Visit Plan Manual ER mobility gains, gravity reduced strengthening progression.    PT Home Exercise Plan Access Code: [email or web address]   Consulted and Agree with Plan of Care Patient             Patient will benefit from skilled therapeutic intervention in order to  improve  the following deficits and impairments:  Decreased strength, Pain, Impaired UE functional use, Decreased range of motion, Decreased activity tolerance, Decreased mobility, Increased edema, Increased muscle spasms  Visit Diagnosis: Acute pain of left shoulder  Stiffness of left shoulder, not elsewhere classified  Muscle weakness (generalized)  Localized edema     Problem List Patient Active Problem List   Diagnosis Date Noted   Pain due to onychomycosis of toenails of both feet 10/09/2020   Hyperlipidemia 09/22/2020   Thickened nails 09/22/2020   Insomnia 09/22/2020   Acute pain of left shoulder 05/19/2020   Tobacco dependence 03/26/2019   Essential hypertension 03/26/2019   Type 2 diabetes mellitus without complication, without long-term current use of insulin ([place]) 03/26/2019   Aneurysm, cerebral, nonruptured 03/26/2019   Scot Jun, PT, DPT, OCS, ATC 10/12/20  9:28 AM    [facility] [address] [place], [place], [address] Phone: [phone]   Fax:  [phone]  Name: Curtis Williamson MRN: [id number] Date of Birth: January 10, 1961

## 2020-10-14 ENCOUNTER — Encounter: Payer: Medicaid Other | Admitting: Physical Therapy

## 2020-10-14 ENCOUNTER — Ambulatory Visit: Payer: Medicaid Other | Admitting: Internal Medicine

## 2020-10-16 ENCOUNTER — Other Ambulatory Visit: Payer: Self-pay | Admitting: Physician Assistant

## 2020-10-16 DIAGNOSIS — R55 Syncope and collapse: Secondary | ICD-10-CM | POA: Diagnosis not present

## 2020-10-16 DIAGNOSIS — R42 Dizziness and giddiness: Secondary | ICD-10-CM | POA: Diagnosis not present

## 2020-10-16 DIAGNOSIS — R197 Diarrhea, unspecified: Secondary | ICD-10-CM | POA: Diagnosis not present

## 2020-10-16 DIAGNOSIS — R000 Tachycardia, unspecified: Secondary | ICD-10-CM | POA: Diagnosis not present

## 2020-10-16 DIAGNOSIS — G4700 Insomnia, unspecified: Secondary | ICD-10-CM

## 2020-10-20 ENCOUNTER — Other Ambulatory Visit: Payer: Self-pay

## 2020-10-20 ENCOUNTER — Ambulatory Visit (INDEPENDENT_AMBULATORY_CARE_PROVIDER_SITE_OTHER): Payer: Worker's Compensation | Admitting: Physical Therapy

## 2020-10-20 DIAGNOSIS — M25612 Stiffness of left shoulder, not elsewhere classified: Secondary | ICD-10-CM

## 2020-10-20 DIAGNOSIS — R600 Localized edema: Secondary | ICD-10-CM | POA: Diagnosis not present

## 2020-10-20 DIAGNOSIS — M6281 Muscle weakness (generalized): Secondary | ICD-10-CM

## 2020-10-20 DIAGNOSIS — M25512 Pain in left shoulder: Secondary | ICD-10-CM

## 2020-10-20 NOTE — Therapy (Signed)
[facility] Physical Therapy [address] [place], [place], [address] Phone: [phone]   Fax:  [phone]  Physical Therapy Treatment/Progress note/Recert  Patient Details  Name: Curtis Williamson MRN: [id number] Date of Birth: 1960/09/17 Referring Provider (PT): Marlou Sa, MD   Encounter Date: 10/20/2020   PT End of Session - 10/20/20 0824     Visit Number 17    Number of Visits 29    Date for PT Re-Evaluation 12/01/20    Authorization Type workers comp,[employer] is case Freight forwarder. Jearld Pies for PT in person 8 visits to start. phone is [phone], Patient relays 12 more have been authorized    Authorization - Visit Number 54    Authorization - Number of Visits 27    Progress Note Due on Visit 20    PT Start Time 0806    PT Stop Time 0855    PT Time Calculation (min) 49 min    Activity Tolerance Patient tolerated treatment well    Behavior During Therapy [facility] for tasks assessed/performed             Past Medical History:  Diagnosis Date   Chronic kidney disease    only has one kidney   Diabetes mellitus without complication ([place])    Essential hypertension    Hyperlipidemia    Hypertension    Phreesia 01/13/2020    Past Surgical History:  Procedure Laterality Date   KIDNEY DONATION     MANDIBLE FRACTURE SURGERY     ROTATOR CUFF REPAIR Right     There were no vitals filed for this visit.   Subjective Assessment - 10/20/20 0844     Subjective Pt. indicated he is overall feeling better today, 2/10 overall pain for his Lt shoulder.    Pertinent History PMH: Rt RTC repair 07/06/20,CKD(only has one kidney, DM,HTN)    Limitations Lifting;House hold activities    Patient Stated Goals To improve the use of his L shoulder    Pain Onset More than a month ago    Pain Onset More than a month ago                [facility] PT Assessment - 10/20/20 0001       Assessment   Medical Diagnosis Lt RTC repair 07/06/20    Referring Provider (PT)  Marlou Sa, MD    Next MD Visit 7/6      AROM   Left Shoulder Flexion 125 Degrees    Left Shoulder ABduction 95 Degrees      PROM   Left Shoulder Flexion 165 Degrees    Left Shoulder ABduction 165 Degrees    Left Shoulder Internal Rotation 65 Degrees    Left Shoulder External Rotation 60 Degrees      Strength   Overall Strength Comments in his available ROM    Left Shoulder Flexion 4/5    Left Shoulder ABduction 4-/5    Left Shoulder Internal Rotation 4+/5    Left Shoulder External Rotation 4/5                           [facility] Adult PT Treatment/Exercise - 10/20/20 0001       Shoulder Exercises: Seated   Other Seated Exercises seated tableslide stretches x10 flexion, X10 abd, X 15 ER holdig 10 sec ea      Shoulder Exercises: Sidelying   External Rotation Left    External Rotation Weight (lbs) 1    External Rotation Limitations 3X10  Flexion Left    Flexion Limitations 2X10    ABduction Left    ABduction Weight (lbs) 1    ABduction Limitations 2X10      Vasopneumatic   Number Minutes Vasopneumatic  15 minutes    Vasopnuematic Location  Shoulder    Vasopneumatic Pressure High    Vasopneumatic Temperature  34      Manual Therapy   Manual therapy comments Lt shoulder PROM, GH mobs grade 3 inferior and A-P                      PT Short Term Goals - 10/20/20 0847       PT SHORT TERM GOAL #1   Title Pt will be Ind in an initial HEP    Status Achieved      PT SHORT TERM GOAL #2   Title Pt will voice understanding of messures to assist in pain reduction    Status Achieved      PT SHORT TERM GOAL #3   Title Patient will increase pain free shoulder AROM flexion and abduciton to 100 degrees    Baseline met for flexion, close for abd    Status Partially Met               PT Long Term Goals - 10/20/20 0848       PT LONG TERM GOAL #1   Title Pt will be Ind in a final HEP to maintain or progress achieved LOF    Baseline -    Status  On-going      PT LONG TERM GOAL #2   Title Increase L shoulder strength  to 4+-5/5 for improved functional 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    Status On-going      PT LONG TERM GOAL #4   Title Pt's L shoulder will decreased to less than 3/10 c household activites and drivng a truck.    Status On-going                   Plan - 10/20/20 0845     Clinical Impression Statement He has attended 72 PT visits and is making slow but steady progress. He does still have mild ROM limitations and moderate strength limitations limiting functional use of his Left arm. PT recommending to continue PT to work on improving these deficits. Of note his sessions have somewhat been limited by overall low back pain limiting his standing activity and overall activity tolerance.    Personal Factors and Comorbidities Comorbidity 1;Profession    Comorbidities PMH: Rt RTC repair 07/06/20,CKD(only has one kidney, DM,HTN)    Examination-Activity Limitations Carry;Lift;Reach Overhead    Examination-Participation Restrictions Occupation    Stability/Clinical Decision Making Evolving/Moderate complexity    Rehab Potential Good    PT Frequency 3x / week    PT Duration 8 weeks    PT Treatment/Interventions ADLs/Self Care Home Management;Cryotherapy;Electrical Stimulation;Ultrasound;Traction;Moist Heat;Iontophoresis 30m/ml Dexamethasone;Functional mobility training;Therapeutic activities;Therapeutic exercise;Balance training;Manual techniques;Patient/family education;Passive range of motion;Dry needling;Taping;Vasopneumatic Device;Joint Manipulations;Neuromuscular re-education    PT Next Visit Plan Manual ER mobility gains, gravity reduced strengthening progression.    PT Home Exercise Plan Access  Code: [email or web address]    [email or web address] Agree with  Plan of Care Patient             Patient will benefit from skilled therapeutic intervention in order to improve the following deficits and impairments:  Decreased strength, Pain, Impaired UE functional use, Decreased range of motion, Decreased activity tolerance, Decreased mobility, Increased edema, Increased muscle spasms  Visit Diagnosis: Acute pain of left shoulder  Stiffness of left shoulder, not elsewhere classified  Muscle weakness (generalized)  Localized edema     Problem List Patient Active Problem List   Diagnosis Date Noted   Pain due to onychomycosis of toenails of both feet 10/09/2020   Hyperlipidemia 09/22/2020   Thickened nails 09/22/2020   Insomnia 09/22/2020   Acute pain of left shoulder 05/19/2020   Tobacco dependence 03/26/2019   Essential hypertension 03/26/2019   Type 2 diabetes mellitus without complication, without long-term current use of insulin ([place]) 03/26/2019   Aneurysm, cerebral, nonruptured 03/26/2019    Silvestre Mesi 10/20/2020, 8:48 AM  [facility] Physical Therapy [address] [place], [place], [address] Phone: [phone]   Fax:  [phone]  Name: Curtis Williamson MRN: [id number] Date of Birth: 1960-11-07

## 2020-10-21 ENCOUNTER — Other Ambulatory Visit: Payer: Self-pay | Admitting: Family Medicine

## 2020-10-21 ENCOUNTER — Ambulatory Visit (INDEPENDENT_AMBULATORY_CARE_PROVIDER_SITE_OTHER): Payer: Worker's Compensation | Admitting: Orthopedic Surgery

## 2020-10-21 ENCOUNTER — Encounter: Payer: Self-pay | Admitting: Orthopedic Surgery

## 2020-10-21 DIAGNOSIS — S46012D Strain of muscle(s) and tendon(s) of the rotator cuff of left shoulder, subsequent encounter: Secondary | ICD-10-CM | POA: Diagnosis not present

## 2020-10-21 NOTE — Progress Notes (Signed)
Office Visit Note   Patient: Curtis Williamson           Date of Birth: 1961-03-08           MRN: [id number] Visit Date: 10/21/2020 Requested by: [name], MD [address] [place],  [place] [address] PCP: [name], MD  Subjective: Chief Complaint  Patient presents with   Left Shoulder - Follow-up   Follow-up    HPI: [name] is now 3 and half months out from left shoulder rotator cuff tear repair.  He has been doing well in therapy.  He is having some low back pain.  His mobility has improved more than his strength has in the shoulder.  Works as a [employer].  CPM machine did help.  Back evaluation next week.              ROS: All systems reviewed are negative as they relate to the chief complaint within the history of present illness.  Patient denies  fevers or chills.   Assessment & Plan: Visit Diagnoses:  1. Traumatic complete tear of left rotator cuff, subsequent encounter     Plan: Impression is improvement in range of motion and less improvement in strength.  He is doing well however.  Therapy 2 times a week for the next 6 weeks with FCE at 6 weeks and follow-up with me in 7 weeks.  I think we will be able to get him back to work in some capacity at that time.  Follow-Up Instructions: No follow-ups on file.   Orders:  Orders Placed This Encounter  Procedures   Ambulatory referral to Physical Therapy   No orders of the defined types were placed in this encounter.     Procedures: No procedures performed   Clinical Data: No additional findings.  Objective: Vital Signs: There were no vitals taken for this visit.  Physical Exam:   Constitutional: Patient appears well-developed HEENT:  Head: Normocephalic Eyes:EOM are normal Neck: Normal range of motion Cardiovascular: Normal rate Pulmonary/chest: Effort normal Neurologic: Patient is alert Skin: Skin is warm Psychiatric: Patient has normal mood and affect   Ortho  Exam: Ortho exam demonstrates range of motion passive on the left 35/85/140.  Rotator cuff strength is good with no coarse grinding or crepitus with active or passive range of motion of that left shoulder.  Nurse case manager present for visit today with discussion.  Out of work until return office visit after [name].  Specialty Comments:  No specialty comments available.  Imaging: No results found.   PMFS History: Patient Active Problem List   Diagnosis Date Noted   Pain due to onychomycosis of toenails of both feet 10/09/2020   Hyperlipidemia 09/22/2020   Thickened nails 09/22/2020   Insomnia 09/22/2020   Acute pain of left shoulder 05/19/2020   Tobacco dependence 03/26/2019   Essential hypertension 03/26/2019   Type 2 diabetes mellitus without complication, without long-term current use of insulin (HCC) 03/26/2019   Aneurysm, cerebral, nonruptured 03/26/2019   Past Medical History:  Diagnosis Date   Chronic kidney disease    only has one kidney   Diabetes mellitus without complication (HCC)    Essential hypertension    Hyperlipidemia    Hypertension    Phreesia 01/13/2020    Family History  Problem Relation Age of Onset   Hypertension Mother    Kidney failure Mother    Kidney disease Mother    Heart disease Mother    ADD / ADHD Daughter  Anxiety disorder Daughter    Alcohol abuse Father    Colon cancer Neg Hx    Stomach cancer Neg Hx    Esophageal cancer Neg Hx    Colon polyps Neg Hx     Past Surgical History:  Procedure Laterality Date   KIDNEY DONATION     MANDIBLE FRACTURE SURGERY     ROTATOR CUFF REPAIR Right    Social History   Occupational History   Occupation: Truck driver  Tobacco Use   Smoking status: Every Day    Packs/day: 0.25    Years: 30.00    Pack years: 7.50    Types: Cigarettes   Smokeless tobacco: Never   Tobacco comments:    off and on, has tried to quit  Vaping Use   Vaping Use: Never used  Substance and Sexual Activity    Alcohol use: Yes    Comment: rarely   Drug use: Never   Sexual activity: Not Currently

## 2020-10-22 ENCOUNTER — Encounter: Payer: Medicaid Other | Admitting: Rehabilitative and Restorative Service Providers"

## 2020-10-28 ENCOUNTER — Encounter: Payer: Self-pay | Admitting: Orthopaedic Surgery

## 2020-10-28 ENCOUNTER — Ambulatory Visit (INDEPENDENT_AMBULATORY_CARE_PROVIDER_SITE_OTHER): Payer: Worker's Compensation | Admitting: Orthopaedic Surgery

## 2020-10-28 VITALS — BP 148/91 | HR 76 | Ht 71.0 in | Wt 195.0 lb

## 2020-10-28 DIAGNOSIS — M545 Low back pain, unspecified: Secondary | ICD-10-CM | POA: Diagnosis not present

## 2020-10-28 DIAGNOSIS — G8929 Other chronic pain: Secondary | ICD-10-CM | POA: Diagnosis not present
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
## 2020-11-03 ENCOUNTER — Other Ambulatory Visit: Payer: Self-pay

## 2020-11-03 ENCOUNTER — Ambulatory Visit (INDEPENDENT_AMBULATORY_CARE_PROVIDER_SITE_OTHER): Payer: Worker's Compensation | Admitting: Rehabilitative and Restorative Service Providers"

## 2020-11-03 ENCOUNTER — Encounter: Payer: Self-pay | Admitting: Rehabilitative and Restorative Service Providers"

## 2020-11-03 DIAGNOSIS — M6281 Muscle weakness (generalized): Secondary | ICD-10-CM

## 2020-11-03 DIAGNOSIS — M25612 Stiffness of left shoulder, not elsewhere classified: Secondary | ICD-10-CM

## 2020-11-03 DIAGNOSIS — M25512 Pain in left shoulder: Secondary | ICD-10-CM | POA: Diagnosis not present

## 2020-11-03 DIAGNOSIS — R600 Localized edema: Secondary | ICD-10-CM | POA: Diagnosis not present

## 2020-11-03 DIAGNOSIS — G8929 Other chronic pain: Secondary | ICD-10-CM

## 2020-11-03 DIAGNOSIS — M545 Low back pain, unspecified: Secondary | ICD-10-CM

## 2020-11-03 NOTE — Therapy (Signed)
[facility] [place], [place], [address] Phone: [phone]   Fax:  [phone]  Physical Therapy Treatment/Re-evaluation with Re-Cert  Patient Details  Name: Curtis Williamson MRN: [id number] Date of Birth: [birth year] Referring Provider (PT): Dr. Dean/Dr. Lorin Mercy   Encounter Date: 11/03/2020   PT End of Session - 11/03/20 0951     Visit Number 18    Number of Visits 23   changed to reflect worker's compenstation approval number   Date for PT Re-Evaluation 12/01/20    Authorization Type workers comp,[employer] is case Freight forwarder. Per email contact on 10/28/2020, 6 more visits were approved for care for shoulder and back    Authorization - Visit Number 1    Authorization - Number of Visits 6    Progress Note Due on Visit 23    PT Start Time 0825    PT Stop Time 0845    PT Time Calculation (min) 20 min    Activity Tolerance Patient tolerated treatment well    Behavior During Therapy [facility] for tasks assessed/performed             Past Medical History:  Diagnosis Date   Chronic kidney disease    only has one kidney   Diabetes mellitus without complication ([place])    Essential hypertension    Hyperlipidemia    Hypertension    Phreesia 01/13/2020    Past Surgical History:  Procedure Laterality Date   KIDNEY DONATION     MANDIBLE FRACTURE SURGERY     ROTATOR CUFF REPAIR Right     There were no vitals filed for this visit.   Subjective Assessment - 11/03/20 0827     Subjective Pt. indicated back pain complaints are noted from Rt side and into Rt hip.  Pt. indicated dated back to incident.  Pt. indicated lower part of back was strained.    Pertinent History PMH: Rt RTC repair 07/06/20,CKD(only has one kidney, DM,HTN)    Limitations Lifting;House hold activities    Patient Stated Goals To improve the use of his L shoulder    Currently in Pain? Yes    Pain Score 2     Pain Location Shoulder    Pain Orientation Left     Pain Type Surgical pain    Pain Onset More than a month ago    Pain Frequency Intermittent    Aggravating Factors  lifting arm, reaching    Pain Relieving Factors rest, medicine    Pain Score 4   pain at worst 9/10   Pain Location Back    Pain Descriptors / Indicators Aching;Tightness;Spasm    Pain Type Chronic pain    Pain Onset More than a month ago    Pain Frequency Intermittent    Aggravating Factors  walking, insidious worsening, bending, stooping    Pain Relieving Factors medicine at times, heating pad, massage to back                [facility] PT Assessment - 11/03/20 0001       Assessment   Medical Diagnosis Lt RTC repair 07/06/20, low back    Referring Provider (PT) Dr. Dean/Dr. Lorin Mercy      AROM   AROM Assessment Site Lumbar;Other (comment)    Left Shoulder Flexion 125 Degrees    Left Shoulder ABduction 95 Degrees    Lumbar Flexion back pain, movement to knees    Lumbar Extension 50 % WFL c back pain. repeated ext in standing x 5: improved to  75% WFL ERP back noted    Lumbar - Right Side Bend movement to Rt knee joint, pain in Rt lumbar    Lumbar - Left Side Bend pulling in Rt mid thoracic region, movement to Lt lateral knee joint      PROM   Left Shoulder Flexion 165 Degrees    Left Shoulder ABduction 165 Degrees    Left Shoulder Internal Rotation 65 Degrees    Left Shoulder External Rotation 60 Degrees      Strength   Overall Strength Comments MMT carried over from last visit    Left Shoulder Flexion 4/5    Left Shoulder ABduction 4-/5    Left Shoulder Internal Rotation 4+/5    Left Shoulder External Rotation 4/5                           [facility] Adult PT Treatment/Exercise - 11/03/20 0001       Lumbar Exercises: Stretches   Single Knee to Chest Stretch 3 reps   15 sec bilateral x 3   Other Lumbar Stretch Exercise lumbar trunk rotation in hooklying 15 sec x 5 bilateral                    PT Education - 11/03/20 0846      Education Details HEP adjustments, back assessment result    Person(s) Educated Patient    Methods Explanation;Demonstration;Verbal cues;Handout    Comprehension Returned demonstration;Verbalized understanding              PT Short Term Goals - 10/20/20 0847       PT SHORT TERM GOAL #1   Title Pt will be Ind in an initial HEP    Status Achieved      PT SHORT TERM GOAL #2   Title Pt will voice understanding of messures to assist in pain reduction    Status Achieved      PT SHORT TERM GOAL #3   Title Patient will increase pain free shoulder AROM flexion and abduciton to 100 degrees    Baseline met for flexion, close for abd    Status Partially Met               PT Long Term Goals - 11/03/20 1002       PT LONG TERM GOAL #1   Title Pt will be Ind in a final HEP to maintain or progress achieved LOF    Time 6    Status Revised    Target Date 12/15/20      PT LONG TERM GOAL #2   Title Increase L shoulder strength  to 4+-5/5 for improved functional 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    Time 6    Period Weeks    Status Revised    Target Date 12/15/20      PT LONG TERM GOAL #4   Title Pt's L shoulder will decreased to less than 3/10 c household activites and drivng a truck.    Time 6    Period Weeks    Status Revised  Target Date 12/15/20      PT LONG TERM GOAL #5   Title Pt. will demonstrate lumbar AROM ext 100 % WFL s symptoms for upright standing, walking posture at PLOF s limitation.    Time 6    Period Weeks    Status New    Target Date 12/15/20                   Plan - 11/03/20 0959     Clinical Impression Statement Pt. presented to clinic today c continued complaints of  existing shoulder pain c impairments as previously noted.  Additional referral and worker's compenstation approval (6 visits) for inclusion of presentation of low back pain related to incident.  Presentation of gross shoulder and lumbar mobility deficits c strength impairments that may improve c skilled PT services to facilitate progression towards goals and return to PLOF.    Personal Factors and Comorbidities Comorbidity 1;Profession    Comorbidities PMH: Rt RTC repair 07/06/20,CKD(only has one kidney, DM,HTN)    Examination-Activity Limitations Carry;Lift;Reach Overhead    Examination-Participation Restrictions Occupation    Stability/Clinical Decision Making Evolving/Moderate complexity    Rehab Potential Good    PT Frequency 2x / week    PT Duration 6 weeks    PT Treatment/Interventions ADLs/Self Care Home Management;Cryotherapy;Electrical Stimulation;Ultrasound;Traction;Moist Heat;Iontophoresis 49m/ml Dexamethasone;Functional mobility training;Therapeutic activities;Therapeutic exercise;Balance training;Manual techniques;Patient/family education;Passive range of motion;Dry needling;Taping;Vasopneumatic Device;Joint Manipulations;Neuromuscular re-education    PT Next Visit Plan Continue assessment of lumbar mobility/pain presentation.  Improve strength and mobility for both areas.    PT Home Exercise Plan Access Code: [email or web address]    [email or web address] Agree with Plan of Care Patient             Patient will benefit from skilled therapeutic intervention in order to improve the following deficits and impairments:  Decreased strength, Pain, Impaired UE functional use, Decreased range of motion, Decreased activity tolerance, Decreased mobility, Increased edema, Increased muscle spasms  Visit Diagnosis: Acute pain of left shoulder  Stiffness of left shoulder, not elsewhere classified  Muscle weakness (generalized)  Localized edema  Chronic bilateral low back pain without  sciatica     Problem List Patient Active Problem List   Diagnosis Date Noted   Low back pain 10/28/2020   Pain due to onychomycosis of toenails of both feet 10/09/2020   Hyperlipidemia 09/22/2020   Thickened nails 09/22/2020   Insomnia 09/22/2020   Acute pain of left shoulder 05/19/2020   Tobacco dependence 03/26/2019   Essential hypertension 03/26/2019   Type 2 diabetes mellitus without complication, without long-term current use of insulin [place] 03/26/2019   Aneurysm, cerebral, nonruptured 03/26/2019    MScot Jun PT, DPT, OCS, ATC 11/03/20  10:14 AM   [facility] [address] [address]Phone: [phone]  Fax:  [phone] Name: Curtis LemmonsMRN: [id number] of Birth: [birth year]

## 2020-11-03 NOTE — Patient Instructions (Signed)
Access Code: [email or web address] URL: [email or web address] Date: 11/03/2020 Prepared by: Chyrel Masson  Exercises Supine Shoulder Flexion Extension AAROM with Dowel - 3 x daily - 7 x weekly - 1 sets - 10 reps Supine Shoulder External Rotation in 45 Degrees Abduction AAROM with Dowel - 3 x daily - 7 x weekly - 1 sets - 10 reps Shoulder Scaption AAROM with Dowel - 3 x daily - 7 x weekly - 1 sets - 10 reps Seated Shoulder External Rotation PROM on Table - 2-3 x daily - 6 x weekly - 10 reps - 1-2 sets - 5 hold Shoulder Abduction - Thumbs Up - 2 x daily - 6 x weekly - 3 sets - 10 reps Standing Shoulder Flexion to 90 Degrees - 2 x daily - 6 x weekly - 3 sets - 10 reps Standing Shoulder Row with Anchored Resistance - 2 x daily - 7 x weekly - 10 reps - 3 sets Shoulder Extension with Resistance - 2 x daily - 7 x weekly - 10 reps - 3 sets Shoulder External Rotation with Anchored Resistance - 2 x daily - 6 x weekly - 10 reps - 2-3 sets Standing Shoulder Internal Rotation with Anchored Resistance - 2 x daily - 6 x weekly - 3 sets - 10 reps Supine Lower Trunk Rotation - 2 x daily - 6 x weekly - 10 reps - 1 sets - 5 sec hold Hooklying Single Knee to Chest Stretch - 2 x daily - 6 x weekly - 2 reps - 1 sets - 20 hold Supine Bridge - 2 x daily - 6 x weekly - 10 reps - 1-2 sets - 5 hold
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
## 2020-11-06 ENCOUNTER — Encounter: Payer: Self-pay | Admitting: Neurology

## 2020-11-06 ENCOUNTER — Ambulatory Visit (INDEPENDENT_AMBULATORY_CARE_PROVIDER_SITE_OTHER): Payer: Worker's Compensation | Admitting: Physical Therapy

## 2020-11-06 ENCOUNTER — Other Ambulatory Visit: Payer: Self-pay

## 2020-11-06 ENCOUNTER — Ambulatory Visit: Payer: Medicaid Other | Admitting: Neurology

## 2020-11-06 VITALS — BP 140/95 | HR 86 | Ht 71.0 in | Wt 201.5 lb

## 2020-11-06 DIAGNOSIS — M25612 Stiffness of left shoulder, not elsewhere classified: Secondary | ICD-10-CM | POA: Diagnosis not present

## 2020-11-06 DIAGNOSIS — I671 Cerebral aneurysm, nonruptured: Secondary | ICD-10-CM | POA: Diagnosis not present

## 2020-11-06 DIAGNOSIS — M25512 Pain in left shoulder: Secondary | ICD-10-CM

## 2020-11-06 DIAGNOSIS — M545 Low back pain, unspecified: Secondary | ICD-10-CM

## 2020-11-06 DIAGNOSIS — M6281 Muscle weakness (generalized): Secondary | ICD-10-CM | POA: Diagnosis not present

## 2020-11-06 DIAGNOSIS — R600 Localized edema: Secondary | ICD-10-CM | POA: Diagnosis not present

## 2020-11-06 DIAGNOSIS — G8929 Other chronic pain: Secondary | ICD-10-CM

## 2020-11-06 NOTE — Addendum Note (Signed)
Addended by: Tawnya Crook on: 11/06/2020 11:10 AM   Modules accepted: Orders

## 2020-11-06 NOTE — Therapy (Signed)
[facility] [address] [place], [place], [address] Phone: [phone]   Fax:  [phone]  Physical Therapy Treatment  Patient Details  Name: Curtis Williamson MRN: [id number] Date of Birth: [birth year] Referring Provider (PT): Dr. Dean/Dr. Lorin Mercy   Encounter Date: 11/06/2020   PT End of Session - 11/06/20 0945     Visit Number 19    Number of Visits 23   changed to reflect worker's compenstation approval number   Date for PT Re-Evaluation 12/15/20    Authorization Type workers comp,[employer] is Tourist information centre manager. Per email contact on 10/28/2020, 6 more visits were approved for care for shoulder and back    Authorization - Visit Number 2    Authorization - Number of Visits 6    Progress Note Due on Visit 23    PT Start Time 0845    PT Stop Time 0935    PT Time Calculation (min) 50 min    Activity Tolerance Patient tolerated treatment well    Behavior During Therapy [facility] for tasks assessed/performed             Past Medical History:  Diagnosis Date   Chronic kidney disease    only has one kidney   Diabetes mellitus without complication ([place])    Essential hypertension    Hyperlipidemia    Hypertension    Phreesia 01/13/2020    Past Surgical History:  Procedure Laterality Date   KIDNEY DONATION     MANDIBLE FRACTURE SURGERY     ROTATOR CUFF REPAIR Right     There were no vitals filed for this visit.   Subjective Assessment - 11/06/20 0943     Subjective Pt relays he had a massage and has been doing HEP for his back and he is feeling much better today and ony having minor back pain upon arrival.    Pertinent History PMH: Rt RTC repair 07/06/20,CKD(only has one kidney, DM,HTN)    Limitations Lifting;House hold activities    Patient Stated Goals To improve the use of his L shoulder and back pain    Pain Onset More than a month ago    Pain Onset More than a month ago                [facility] PT Assessment - 11/06/20 0001        Assessment   Medical Diagnosis Lt RTC repair 07/06/20, low back    Referring Provider (PT) Dr. Dean/Dr. Lorin Mercy      AROM   Lumbar Flexion 75%    Lumbar Extension 75%    Lumbar - Right Side Bend 75%    Lumbar - Left Side Bend 75%    Lumbar - Right Rotation 75%    Lumbar - Left Rotation 75%      Strength   Left Hip Flexion 4+/5    Left Hip ABduction 4+/5    Right/Left Knee Right    Right Knee Flexion 5/5    Right Knee Extension 5/5    Left Knee Flexion 5/5      Special Tests   Other special tests negative slump test bilat, + SLR test bilat                           [facility] Adult PT Treatment/Exercise - 11/06/20 0001       Exercises   Exercises Lumbar;Knee/Hip      Lumbar Exercises: Stretches   Double Knee to Chest Stretch 10  seconds    Double Knee to Chest Stretch Limitations 10 reps with feet on pball    Lower Trunk Rotation 5 reps;10 seconds      Lumbar Exercises: Standing   Other Standing Lumbar Exercises rolling green pball up wall to work on shoulder flexion and lumbar extension 2X10 reps      Lumbar Exercises: Seated   Other Seated Lumbar Exercises seated on pball rows and shoulder extensions red X 20 ea, and bilat shoulder  ER with red band 2X10      Lumbar Exercises: Supine   Bridge 10 reps    Bridge Limitations 2 sets, 5 sec in his limited ROM to avoid pain      Modalities   Modalities Moist Heat      Moist Heat Therapy   Number Minutes Moist Heat 10 Minutes    Moist Heat Location Lumbar Spine      Vasopneumatic   Number Minutes Vasopneumatic  10 minutes    Vasopnuematic Location  Shoulder    Vasopneumatic Pressure High    Vasopneumatic Temperature  34      Manual Therapy   Manual therapy comments IASTM with percussion gun to lumbar-thoracic paraspinals and Rt glutes. Central PA mobs lumbar-thoracic grade 3 overall                      PT Short Term Goals - 10/20/20 0847       PT SHORT TERM GOAL #1   Title Pt  will be Ind in an initial HEP    Status Achieved      PT SHORT TERM GOAL #2   Title Pt will voice understanding of messures to assist in pain reduction    Status Achieved      PT SHORT TERM GOAL #3   Title Patient will increase pain free shoulder AROM flexion and abduciton to 100 degrees    Baseline met for flexion, close for abd    Status Partially Met               PT Long Term Goals - 11/03/20 1002       PT LONG TERM GOAL #1   Title Pt will be Ind in a final HEP to maintain or progress achieved LOF    Time 6    Status Revised    Target Date 12/15/20      PT LONG TERM GOAL #2   Title Increase L shoulder strength  to 4+-5/5 for improved functional 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    Time 6    Period Weeks    Status Revised    Target Date 12/15/20      PT LONG TERM GOAL #4   Title Pt's L shoulder will decreased to less than 3/10 c household activites and drivng a truck.    Time 6    Period Weeks    Status Revised    Target Date 12/15/20      PT LONG TERM GOAL #5   Title Pt. will demonstrate lumbar AROM ext 100 % WFL s symptoms for upright standing, walking posture  at PLOF s limitation.    Time 6    Period Weeks    Status New    Target Date 12/15/20                   Plan - 11/06/20 0946     Clinical Impression Statement he does not show signs or lumbar radiculopathy today but does have increased muscle tension, pain, and tightness along with decreased spinal mobility that may be contributing to his chronic low back pain. We trialed manual therapy today for STM/IASTM and spinal mobs today and he appeared to have good response to this. We then combined lumbar and shoulder exercises  to allow more time efficient session. We will attempt to progress him as tolerated to improve his overall funciton.    Personal Factors and Comorbidities Comorbidity 1;Profession    Comorbidities PMH: Rt RTC repair 07/06/20,CKD(only has one kidney, DM,HTN)    Examination-Activity Limitations Carry;Lift;Reach Overhead    Examination-Participation Restrictions Occupation    Stability/Clinical Decision Making Evolving/Moderate complexity    Rehab Potential Good    PT Frequency 2x / week    PT Duration 6 weeks    PT Treatment/Interventions ADLs/Self Care Home Management;Cryotherapy;Electrical Stimulation;Ultrasound;Traction;Moist Heat;Iontophoresis 11m/ml Dexamethasone;Functional mobility training;Therapeutic activities;Therapeutic exercise;Balance training;Manual techniques;Patient/family education;Passive range of motion;Dry needling;Taping;Vasopneumatic Device;Joint Manipulations;Neuromuscular re-education    PT Next Visit Plan Continue assessment of lumbar mobility/pain presentation.  Improve strength and mobility for both areas.    PT Home Exercise Plan Access Code: [email or web address]    [email or web address] Agree with Plan of Care Patient             Patient will benefit from skilled therapeutic intervention in order to improve the following deficits and impairments:  Decreased strength, Pain, Impaired UE functional use, Decreased range of motion, Decreased activity tolerance, Decreased mobility, Increased edema, Increased muscle spasms  Visit Diagnosis: Acute pain of left shoulder  Stiffness of left shoulder, not elsewhere classified  Muscle weakness (generalized)  Localized edema  Chronic bilateral low back pain without sciatica     Problem List Patient Active Problem List   Diagnosis Date Noted   Low back pain 10/28/2020   Pain due to onychomycosis of toenails of both feet 10/09/2020   Hyperlipidemia 09/22/2020   Thickened nails 09/22/2020   Insomnia 09/22/2020   Acute pain of left  shoulder 05/19/2020   Tobacco dependence 03/26/2019   Essential hypertension 03/26/2019   Type 2 diabetes mellitus without complication, without long-term current use of insulin ([place] 03/26/2019   Aneurysm, cerebral, nonruptured 03/26/2019    BSilvestre Mesi7/22/2022, 9:52 AM  [facility] [address] [address]Phone: [phone]  Fax:  [phone] Name: Curtis ErricoMRN: [id number] of Birth: [birth year]

## 2020-11-06 NOTE — Patient Instructions (Signed)
Check MRA of head and repeat again in one year. Follow up in one year after repeat MRA

## 2020-11-06 NOTE — Addendum Note (Signed)
Addended by: Tawnya Crook on: 11/06/2020 11:14 AM   Modules accepted: Orders

## 2020-11-09 ENCOUNTER — Encounter: Payer: Medicaid Other | Admitting: Physical Therapy

## 2020-11-11 ENCOUNTER — Ambulatory Visit (INDEPENDENT_AMBULATORY_CARE_PROVIDER_SITE_OTHER): Payer: Medicaid Other | Admitting: Rehabilitative and Restorative Service Providers"

## 2020-11-11 ENCOUNTER — Other Ambulatory Visit: Payer: Self-pay

## 2020-11-11 ENCOUNTER — Encounter: Payer: Self-pay | Admitting: Rehabilitative and Restorative Service Providers"

## 2020-11-11 DIAGNOSIS — R600 Localized edema: Secondary | ICD-10-CM

## 2020-11-11 DIAGNOSIS — M25612 Stiffness of left shoulder, not elsewhere classified: Secondary | ICD-10-CM | POA: Diagnosis not present

## 2020-11-11 DIAGNOSIS — M545 Low back pain, unspecified: Secondary | ICD-10-CM

## 2020-11-11 DIAGNOSIS — G8929 Other chronic pain: Secondary | ICD-10-CM

## 2020-11-11 DIAGNOSIS — M6281 Muscle weakness (generalized): Secondary | ICD-10-CM

## 2020-11-11 DIAGNOSIS — M25512 Pain in left shoulder: Secondary | ICD-10-CM

## 2020-11-11 NOTE — Therapy (Signed)
[place], [place], [address] Phone: [phone]   Fax:  [phone]  Physical Therapy Treatment  Patient Details  Name: Curtis Williamson MRN: [id number] Date of Birth: December 09, 1960 Referring Provider (PT): Dr. Dean/Dr. Lorin Mercy   Encounter Date: 11/11/2020   PT End of Session - 11/11/20 0808     Visit Number 20    Number of Visits 23   changed to reflect worker's compenstation approval number   Date for PT Re-Evaluation 12/15/20    Authorization Type workers comp,[employer] is Tourist information centre manager. Per email contact on 10/28/2020, 6 more visits were approved for care for shoulder and back    Authorization - Visit Number 3    Authorization - Number of Visits 6    Progress Note Due on Visit 23    PT Start Time 0800    PT Stop Time 0840    PT Time Calculation (min) 40 min    Activity Tolerance Patient tolerated treatment well    Behavior During Therapy [facility] for tasks assessed/performed             Past Medical History:  Diagnosis Date   Chronic kidney disease    only has one kidney   Diabetes mellitus without complication ([place])    Essential hypertension    Hyperlipidemia    Hypertension    Phreesia 01/13/2020    Past Surgical History:  Procedure Laterality Date   KIDNEY DONATION     MANDIBLE FRACTURE SURGERY     ROTATOR CUFF REPAIR Right     There were no vitals filed for this visit.   Subjective Assessment - 11/11/20 0806     Subjective Pt. indicated feeling pain in back around 2/10 today.  Pt. stated not as good as the last visit but still doing better than before. "pretty good" on shoulder.    Pertinent History PMH: Rt RTC repair 07/06/20,CKD(only has one kidney, DM,HTN)    Limitations Lifting;House hold activities    Patient Stated Goals To improve the use of his L shoulder and back pain    Currently in Pain? Yes    Pain Score --   a little bit   Pain Location Shoulder    Pain Orientation Left    Pain  Descriptors / Indicators Sore;Aching    Pain Type Surgical pain    Pain Onset More than a month ago    Pain Frequency Intermittent    Aggravating Factors  lifting, reaching at times    Pain Relieving Factors rest, medicine    Pain Score 2    Pain Location Back    Pain Orientation Lower    Pain Descriptors / Indicators Sore;Aching    Pain Type Chronic pain    Pain Onset More than a month ago    Pain Frequency Intermittent    Aggravating Factors  standing/walking bending    Pain Relieving Factors exercise program                               [facility] Adult PT Treatment/Exercise - 11/11/20 0001       Lumbar Exercises: Stretches   Lower Trunk Rotation Other (comment);5 reps   5 x 15 seconds bilateral     Lumbar Exercises: Aerobic   Nustep Lvl 6 10 mins UE/LE      Lumbar Exercises: Supine   Bridge Other (comment)   2 x 10     Shoulder Exercises: Supine  Horizontal ABduction Both   3 x 10   Theraband Level (Shoulder Horizontal ABduction) Level 3 (Green)      Shoulder Exercises: Standing   External Rotation Left   3 x 10   Theraband Level (Shoulder External Rotation) Level 3 (Green)    Internal Rotation Left   3 x 10   Theraband Level (Shoulder Internal Rotation) Level 3 (Green)      Shoulder Exercises: ROM/Strengthening   Lat Pull Other (comment)   2 x 15 20 lbs   Cybex Row Other (comment)   2 x 15 20 lbs                     PT Short Term Goals - 10/20/20 0847       PT SHORT TERM GOAL #1   Title Pt will be Ind in an initial HEP    Status Achieved      PT SHORT TERM GOAL #2   Title Pt will voice understanding of messures to assist in pain reduction    Status Achieved      PT SHORT TERM GOAL #3   Title Patient will increase pain free shoulder AROM flexion and abduciton to 100 degrees    Baseline met for flexion, close for abd    Status Partially Met               PT Long Term Goals - 11/03/20 1002       PT LONG TERM GOAL  #1   Title Pt will be Ind in a final HEP to maintain or progress achieved LOF    Time 6    Status Revised    Target Date 12/15/20      PT LONG TERM GOAL #2   Title Increase L shoulder strength  to 4+-5/5 for improved functional 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    Time 6    Period Weeks    Status Revised    Target Date 12/15/20      PT LONG TERM GOAL #4   Title Pt's L shoulder will decreased to less than 3/10 c household activites and drivng a truck.    Time 6    Period Weeks    Status Revised    Target Date 12/15/20      PT LONG TERM GOAL #5   Title Pt. will demonstrate lumbar AROM ext 100 % WFL s symptoms for upright standing, walking posture at PLOF s limitation.    Time 6    Period Weeks    Status New    Target Date 12/15/20                   Plan - 11/11/20 0839     Clinical Impression Statement Pt. has continued to reduce less severity of symptoms in arm and back in last week or so.  Progressed today to include heavier resistance strengthening for shoulder for improved functional activity.  Pt to continue to benefit from skilled PT services to improve rotation movement, sustained elevation and push/pull to replicate work activity c driving.    Personal Factors  and Comorbidities Comorbidity 1;Profession    Comorbidities PMH: Rt RTC repair 07/06/20,CKD(only has one kidney, DM,HTN)    Examination-Activity Limitations Carry;Lift;Reach Overhead    Examination-Participation Restrictions Occupation    Stability/Clinical Decision Making Evolving/Moderate complexity    Rehab Potential Good    PT Frequency 2x / week    PT Duration 6 weeks    PT Treatment/Interventions ADLs/Self Care Home  Management;Cryotherapy;Electrical Stimulation;Ultrasound;Traction;Moist Heat;Iontophoresis 76m/ml Dexamethasone;Functional mobility training;Therapeutic activities;Therapeutic exercise;Balance training;Manual techniques;Patient/family education;Passive range of motion;Dry needling;Taping;Vasopneumatic Device;Joint Manipulations;Neuromuscular re-education    PT Next Visit Plan Seated driving mimickry,    PT Home Exercise Plan Access Code: [email or web address]   Consulted and Agree with Plan of Care Patient             Patient will benefit from skilled therapeutic intervention in order to improve the following deficits and impairments:  Decreased strength, Pain, Impaired UE functional use, Decreased range of motion, Decreased activity tolerance, Decreased mobility, Increased edema, Increased muscle spasms  Visit Diagnosis: Acute pain of left shoulder  Stiffness of left shoulder, not elsewhere classified  Muscle weakness (generalized)  Localized edema  Chronic bilateral low back pain without sciatica     Problem List Patient Active Problem List   Diagnosis Date Noted   Low back pain 10/28/2020   Pain due to onychomycosis of toenails of both feet 10/09/2020   Hyperlipidemia 09/22/2020   Thickened nails 09/22/2020   Insomnia 09/22/2020   Acute pain of left shoulder 05/19/2020   Tobacco dependence 03/26/2019   Essential hypertension 03/26/2019   Type 2 diabetes mellitus without complication, without long-term current use of insulin ([place] 03/26/2019   Aneurysm, cerebral, nonruptured 03/26/2019    MScot Jun PT, DPT, OCS, ATC 11/11/20  8:41 AM    [facility] [address] [address]Phone: [phone]  Fax:  [phone] Name: Curtis RieraMRN: [id number] of Birth: [birth year]

## 2020-11-13 ENCOUNTER — Ambulatory Visit (INDEPENDENT_AMBULATORY_CARE_PROVIDER_SITE_OTHER): Payer: Worker's Compensation | Admitting: Rehabilitative and Restorative Service Providers"

## 2020-11-13 ENCOUNTER — Other Ambulatory Visit: Payer: Self-pay

## 2020-11-13 ENCOUNTER — Encounter: Payer: Self-pay | Admitting: Rehabilitative and Restorative Service Providers"

## 2020-11-13 DIAGNOSIS — M25612 Stiffness of left shoulder, not elsewhere classified: Secondary | ICD-10-CM | POA: Diagnosis not present

## 2020-11-13 DIAGNOSIS — R600 Localized edema: Secondary | ICD-10-CM | POA: Diagnosis not present

## 2020-11-13 DIAGNOSIS — M545 Low back pain, unspecified: Secondary | ICD-10-CM

## 2020-11-13 DIAGNOSIS — M25512 Pain in left shoulder: Secondary | ICD-10-CM

## 2020-11-13 DIAGNOSIS — G8929 Other chronic pain: Secondary | ICD-10-CM

## 2020-11-13 DIAGNOSIS — M6281 Muscle weakness (generalized): Secondary | ICD-10-CM

## 2020-11-13 NOTE — Therapy (Signed)
[place], [place], [address] Phone: [phone]   Fax:  [phone]  Physical Therapy Treatment  Patient Details  Name: Curtis Williamson MRN: [id number] Date of Birth: [birth year] Referring Provider (PT): Dr. Dean/Dr. Lorin Mercy   Encounter Date: 11/13/2020   PT End of Session - 11/13/20 0806     Visit Number 21    Number of Visits 23   changed to reflect worker's compenstation approval number   Date for PT Re-Evaluation 12/15/20    Authorization Type workers comp,[employer] is Tourist information centre manager. Per email contact on 10/28/2020, 6 more visits were approved for care for shoulder and back    Authorization - Visit Number 4    Authorization - Number of Visits 6    Progress Note Due on Visit 23    PT Start Time 0803    PT Stop Time [phone]    PT Time Calculation (min) 39 min    Activity Tolerance Patient tolerated treatment well    Behavior During Therapy [facility] for tasks assessed/performed             Past Medical History:  Diagnosis Date   Chronic kidney disease    only has one kidney   Diabetes mellitus without complication ([place])    Essential hypertension    Hyperlipidemia    Hypertension    Phreesia 01/13/2020    Past Surgical History:  Procedure Laterality Date   KIDNEY DONATION     MANDIBLE FRACTURE SURGERY     ROTATOR CUFF REPAIR Right     There were no vitals filed for this visit.   Subjective Assessment - 11/13/20 0806     Subjective Pt. indicated complaints in Lt shoulder 2/10 or so, noted mostly c reaching across or behind body.    Pertinent History PMH: Rt RTC repair 07/06/20,CKD(only has one kidney, DM,HTN)    Limitations Lifting;House hold activities    Patient Stated Goals To improve the use of his L shoulder and back pain    Currently in Pain? Yes    Pain Location Shoulder    Pain Orientation Left    Pain Descriptors / Indicators Sore;Aching    Pain Type Surgical pain    Pain Onset More than a  month ago    Pain Frequency Intermittent    Aggravating Factors  reaching across body, behind body    Pain Relieving Factors rest    Pain Score 0    Pain Location Back    Pain Onset More than a month ago                               [facility] Adult PT Treatment/Exercise - 11/13/20 0001       Lumbar Exercises: Aerobic   Nustep Lvl 6 10 mins UE/LE      Knee/Hip Exercises: Seated   Other Seated Knee/Hip Exercises seated simulated steerwing wheel driving holding theraweb c 1 lb weights on wrists bilateral, 30 sec x 4      Shoulder Exercises: Standing   External Rotation Left   3 x 10   Theraband Level (Shoulder External Rotation) Level 3 (Green)    Internal Rotation Left   3 x 10   Theraband Level (Shoulder Internal Rotation) Level 3 (Green)      Shoulder Exercises: ROM/Strengthening   Cybex Row Other (comment)   2 x 15 20 lbs     Shoulder Exercises: Stretch   Other  Shoulder Stretches supine cross arm stretch 15 sec x 5 Lt, sleeper stretch 15 sec x 5 Lt      Manual Therapy   Manual therapy comments compression to Lt infraspinatus c ER movement                      PT Short Term Goals - 10/20/20 0847       PT SHORT TERM GOAL #1   Title Pt will be Ind in an initial HEP    Status Achieved      PT SHORT TERM GOAL #2   Title Pt will voice understanding of messures to assist in pain reduction    Status Achieved      PT SHORT TERM GOAL #3   Title Patient will increase pain free shoulder AROM flexion and abduciton to 100 degrees    Baseline met for flexion, close for abd    Status Partially Met               PT Long Term Goals - 11/03/20 1002       PT LONG TERM GOAL #1   Title Pt will be Ind in a final HEP to maintain or progress achieved LOF    Time 6    Status Revised    Target Date 12/15/20      PT LONG TERM GOAL #2   Title Increase L shoulder strength  to 4+-5/5 for improved functional including be able to drive an eighteen  wheel truck.    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    Time 6    Period Weeks    Status Revised    Target Date 12/15/20      PT LONG TERM GOAL #4   Title Pt's L shoulder will decreased to less than 3/10 c household activites and drivng a truck.    Time 6    Period Weeks    Status Revised    Target Date 12/15/20      PT LONG TERM GOAL #5   Title Pt. will demonstrate lumbar AROM ext 100 % WFL s symptoms for upright standing, walking posture at PLOF s limitation.    Time 6    Period Weeks    Status New    Target Date 12/15/20                   Plan - 11/13/20 1610     Clinical Impression Statement Lt shoulder tightness for cross arm and IR noted c trigger points in Lt infraspinatus.  Compression c movement reduced complaints c movement in part and improved ability to perform IR/ER resistance training.  Replication of steering wheel movement c resistance produced some complaints c Lt hand crossing over to right on top of wheel.    Personal Factors and Comorbidities Comorbidity 1;Profession    Comorbidities PMH: Rt RTC repair 07/06/20,CKD(only has one kidney, DM,HTN)    Examination-Activity Limitations Carry;Lift;Reach Overhead    Examination-Participation Restrictions Occupation    Stability/Clinical Decision Making Evolving/Moderate complexity    Rehab Potential Good    PT Frequency 2x / week    PT Duration 6 weeks    PT Treatment/Interventions  ADLs/Self Care Home Management;Cryotherapy;Electrical Stimulation;Ultrasound;Traction;Moist Heat;Iontophoresis 33m/ml Dexamethasone;Functional mobility training;Therapeutic activities;Therapeutic exercise;Balance training;Manual techniques;Patient/family education;Passive range of motion;Dry needling;Taping;Vasopneumatic  Device;Joint Manipulations;Neuromuscular re-education    PT Next Visit Plan Lt infraspinatus trigger point release, Lt arm strengthening in outstretched positioning.    PT Home Exercise Plan Access Code: [email or web address]    [email or web address] Agree with Plan of Care Patient             Patient will benefit from skilled therapeutic intervention in order to improve the following deficits and impairments:  Decreased strength, Pain, Impaired UE functional use, Decreased range of motion, Decreased activity tolerance, Decreased mobility, Increased edema, Increased muscle spasms  Visit Diagnosis: Acute pain of left shoulder  Stiffness of left shoulder, not elsewhere classified  Muscle weakness (generalized)  Localized edema  Chronic bilateral low back pain without sciatica     Problem List Patient Active Problem List   Diagnosis Date Noted   Low back pain 10/28/2020   Pain due to onychomycosis of toenails of both feet 10/09/2020   Hyperlipidemia 09/22/2020   Thickened nails 09/22/2020   Insomnia 09/22/2020   Acute pain of left shoulder 05/19/2020   Tobacco dependence 03/26/2019   Essential hypertension 03/26/2019   Type 2 diabetes mellitus without complication, without long-term current use of insulin ([place] 03/26/2019   Aneurysm, cerebral, nonruptured 03/26/2019    MScot Jun PT, DPT, OCS, ATC 11/13/20  8:40 AM    [facility] [address] [address]Phone: [phone]  Fax:  [phone] Name: CCasimiro LienhardMRN: [id number] of Birth: 1July 18, 1962

## 2020-11-16 ENCOUNTER — Other Ambulatory Visit: Payer: Self-pay

## 2020-11-16 ENCOUNTER — Ambulatory Visit (INDEPENDENT_AMBULATORY_CARE_PROVIDER_SITE_OTHER): Payer: Worker's Compensation | Admitting: Rehabilitative and Restorative Service Providers"

## 2020-11-16 ENCOUNTER — Encounter: Payer: Self-pay | Admitting: Rehabilitative and Restorative Service Providers"

## 2020-11-16 DIAGNOSIS — M25512 Pain in left shoulder: Secondary | ICD-10-CM | POA: Diagnosis not present

## 2020-11-16 DIAGNOSIS — R600 Localized edema: Secondary | ICD-10-CM

## 2020-11-16 DIAGNOSIS — M25612 Stiffness of left shoulder, not elsewhere classified: Secondary | ICD-10-CM

## 2020-11-16 DIAGNOSIS — M545 Low back pain, unspecified: Secondary | ICD-10-CM

## 2020-11-16 DIAGNOSIS — M6281 Muscle weakness (generalized): Secondary | ICD-10-CM | POA: Diagnosis not present

## 2020-11-16 DIAGNOSIS — G8929 Other chronic pain: Secondary | ICD-10-CM

## 2020-11-16 NOTE — Therapy (Addendum)
[facility] [address] [place], [place], [address] Phone: [phone]   Fax:  [phone]  Physical Therapy Treatment /Discharge  Patient Details  Name: Curtis Williamson MRN: [id number] Date of Birth: [birth year] Referring Provider (PT): Dr. Dean/Dr. Lorin Mercy   Encounter Date: 11/16/2020   PT End of Session - 11/16/20 0808     Visit Number 22    Number of Visits 23   changed to reflect worker's compenstation approval number   Date for PT Re-Evaluation 12/15/20    Authorization Type workers comp,[employer] is Tourist information centre manager. Per email contact on 10/28/2020, 6 more visits were approved for care for shoulder and back    Authorization - Visit Number 5    Authorization - Number of Visits 6    Progress Note Due on Visit 23    PT Start Time 0803    PT Stop Time 0829   Pt. requested to leave early for taking daughter somewhere   PT Time Calculation (min) 26 min    Activity Tolerance Patient tolerated treatment well    Behavior During Therapy [facility] for tasks assessed/performed             Past Medical History:  Diagnosis Date   Chronic kidney disease    only has one kidney   Diabetes mellitus without complication ([place])    Essential hypertension    Hyperlipidemia    Hypertension    Phreesia 01/13/2020    Past Surgical History:  Procedure Laterality Date   KIDNEY DONATION     MANDIBLE FRACTURE SURGERY     ROTATOR CUFF REPAIR Right     There were no vitals filed for this visit.   Subjective Assessment - 11/16/20 0808     Subjective Pt. reported shoulder around 3/10 and some back sore after some exercise in the last few days.  Pt. indicated he felt better so he did a bit more activity and indicated that might be why he feels a bit more today.    Pertinent History PMH: Rt RTC repair 07/06/20,CKD(only has one kidney, DM,HTN)    Limitations Lifting;House hold activities    Patient Stated Goals To improve the use of his L shoulder and back  pain    Currently in Pain? Yes    Pain Location Shoulder    Pain Orientation Left    Pain Descriptors / Indicators Aching;Sore    Pain Type Surgical pain    Pain Onset More than a month ago    Pain Frequency Intermittent    Aggravating Factors  post activity soreness, end range behind back.    Pain Relieving Factors rest, stretching    Pain Score 3    Pain Location Back    Pain Orientation Lower    Pain Descriptors / Indicators Aching;Sore    Pain Type Chronic pain    Pain Onset More than a month ago    Pain Frequency Intermittent    Aggravating Factors  after activity soreness    Pain Relieving Factors stretching                [facility] PT Assessment - 11/16/20 0001       AROM   AROM Assessment Site Other (comment)   HBB Lt to L1                          [facility] Adult PT Treatment/Exercise - 11/16/20 0001       Lumbar Exercises: Aerobic  UBE (Upper Arm Bike) UE/LE lvl 5.5 6 mins      Shoulder Exercises: Stretch   Other Shoulder Stretches supine cross arm stretch 15 sec x 5 Lt, sleeper stretch 15 sec x 5 Lt, IR rope stretching behind back 15 sec x 5 Lt   reviewed again, poor knowledge on request today from last viist     Manual Therapy   Manual therapy comments compression to Lt infraspinatus c ER movement.  MWM c sustained posterior glide and active ER                      PT Short Term Goals - 10/20/20 0847       PT SHORT TERM GOAL #1   Title Pt will be Ind in an initial HEP    Status Achieved      PT SHORT TERM GOAL #2   Title Pt will voice understanding of messures to assist in pain reduction    Status Achieved      PT SHORT TERM GOAL #3   Title Patient will increase pain free shoulder AROM flexion and abduciton to 100 degrees    Baseline met for flexion, close for abd    Status Partially Met               PT Long Term Goals - 11/03/20 1002       PT LONG TERM GOAL #1   Title Pt will be Ind in a final HEP to  maintain or progress achieved LOF    Time 6    Status Revised    Target Date 12/15/20      PT LONG TERM GOAL #2   Title Increase L shoulder strength  to 4+-5/5 for improved functional 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    Time 6    Period Weeks    Status Revised    Target Date 12/15/20      PT LONG TERM GOAL #4   Title Pt's L shoulder will decreased to less than 3/10 c household activites and drivng a truck.    Time 6    Period Weeks    Status Revised    Target Date 12/15/20      PT LONG TERM GOAL #5   Title Pt. will demonstrate lumbar AROM ext 100 % WFL s symptoms for upright standing, walking posture at PLOF s limitation.    Time 6    Period Weeks    Status New    Target Date 12/15/20                   Plan - 11/16/20 0818     Clinical Impression Statement Shortened visit today due to Pt. request to allow him to transport daughter somewhere.  Continued intervention today to reduce upper arm symptoms and improve posterior capsule mobility.  Poor knowledge upon request of new exercise from last visit (cross arm stretch, sleeper stretch) so reviewed again today.    Personal Factors and Comorbidities Comorbidity 1;Profession    Comorbidities PMH: Rt RTC repair 07/06/20,CKD(only has one kidney, DM,HTN)    Examination-Activity  Limitations Carry;Lift;Reach Overhead    Examination-Participation Restrictions Occupation    Stability/Clinical Decision Making Evolving/Moderate complexity    Rehab Potential Good    PT Frequency 2x / week    PT Duration 6 weeks    PT Treatment/Interventions ADLs/Self Care Home Management;Cryotherapy;Electrical Stimulation;Ultrasound;Traction;Moist Heat;Iontophoresis 45m/ml  Dexamethasone;Functional mobility training;Therapeutic activities;Therapeutic exercise;Balance training;Manual techniques;Patient/family education;Passive range of motion;Dry needling;Taping;Vasopneumatic Device;Joint Manipulations;Neuromuscular re-education    PT Next Visit Plan Reassessment next visit, end of authorized visit number.    PT Home Exercise Plan Access Code: [email or web address]    [email or web address] Agree with Plan of Care Patient             Patient will benefit from skilled therapeutic intervention in order to improve the following deficits and impairments:  Decreased strength, Pain, Impaired UE functional use, Decreased range of motion, Decreased activity tolerance, Decreased mobility, Increased edema, Increased muscle spasms  Visit Diagnosis: Acute pain of left shoulder  Stiffness of left shoulder, not elsewhere classified  Muscle weakness (generalized)  Localized edema  Chronic bilateral low back pain without sciatica     Problem List Patient Active Problem List   Diagnosis Date Noted   Low back pain 10/28/2020   Pain due to onychomycosis of toenails of both feet 10/09/2020   Hyperlipidemia 09/22/2020   Thickened nails 09/22/2020   Insomnia 09/22/2020   Acute pain of left shoulder 05/19/2020   Tobacco dependence 03/26/2019   Essential hypertension 03/26/2019   Type 2 diabetes mellitus without complication, without long-term current use of insulin ([place] 03/26/2019   Aneurysm, cerebral, nonruptured 03/26/2019    MScot Jun PT, DPT, OCS, ATC 11/16/20  8:25 AM  PHYSICAL THERAPY DISCHARGE SUMMARY  Visits from Start of Care: 22  Current functional level related to goals / functional outcomes: See note   Remaining deficits: See note   Education / Equipment: HEP   Patient agrees to discharge. Patient goals were partially met. Patient is being discharged due to not returning since the last visit.  MScot Jun PT, DPT, OCS, ATC 01/15/21  10:44  AM       [facility] [address] [address]Phone: [phone]  Fax:  [phone] Name: CDaryll SpisakMRN: [id number] of Birth: [birth year]

## 2020-11-17 ENCOUNTER — Other Ambulatory Visit: Payer: Self-pay | Admitting: Physician Assistant

## 2020-11-18 ENCOUNTER — Encounter: Payer: Worker's Compensation | Admitting: Rehabilitative and Restorative Service Providers"

## 2020-11-30 ENCOUNTER — Telehealth: Payer: Self-pay

## 2020-11-30 NOTE — Telephone Encounter (Signed)
Telephone call from Taron at [facility] imaging, Wanted to know if we wanted to do a Peer-Peer with pt insurance.  PA denied as well as the medical review.  Please advise. If so I will call and set it up at [phone] Case # [id number]

## 2020-12-02 ENCOUNTER — Other Ambulatory Visit: Payer: Medicaid Other

## 2020-12-03 ENCOUNTER — Ambulatory Visit
Admission: RE | Admit: 2020-12-03 | Discharge: 2020-12-03 | Disposition: A | Discharge status: Home / Self Care | Payer: Medicaid Other | Source: Ambulatory Visit | Attending: Neurology | Admitting: Neurology

## 2020-12-03 DIAGNOSIS — Z8679 Personal history of other diseases of the circulatory system: Secondary | ICD-10-CM | POA: Diagnosis not present

## 2020-12-03 DIAGNOSIS — I671 Cerebral aneurysm, nonruptured: Secondary | ICD-10-CM

## 2020-12-03 DIAGNOSIS — Q283 Other malformations of cerebral vessels: Secondary | ICD-10-CM | POA: Diagnosis not present

## 2020-12-03 IMAGING — MR MR MRA HEAD W/O CM
2 series · 10 of 48 positions shown · non-contrast
Comparison: MRA head [DATE]. CT angiogram head/neck [DATE].

CLINICAL DATA: Nonruptured cerebral aneurysm [MK] ([MK]-CM).
Nonruptured cerebral aneurysm.

EXAM:
MRA HEAD WITHOUT CONTRAST
TECHNIQUE: Angiographic images of the Circle of Willis were acquired using MRA
technique without intravenous contrast.

[Series 9: [id number] · axial · 0.6mm · 0.26mm/px · z∈[-29,+40]mm · 9 of 149 slices shown]
[im 7/149]
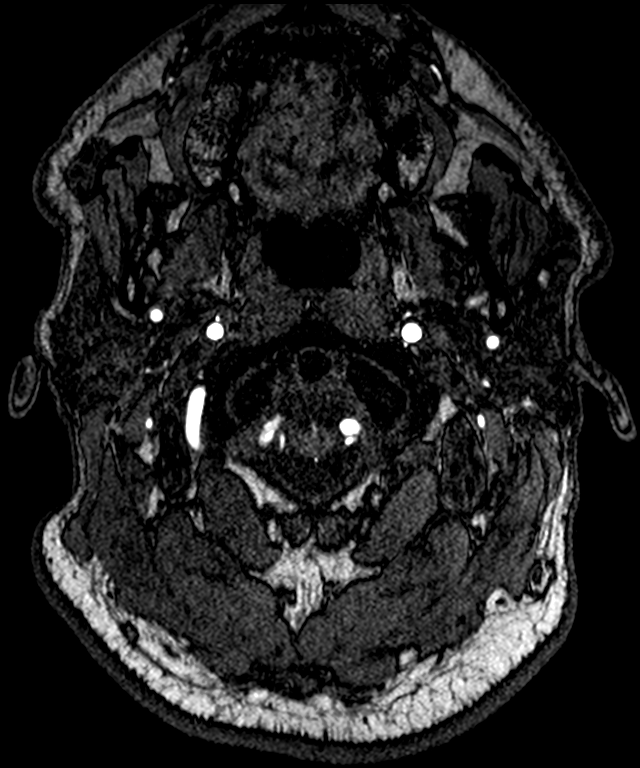
[im 23/149]
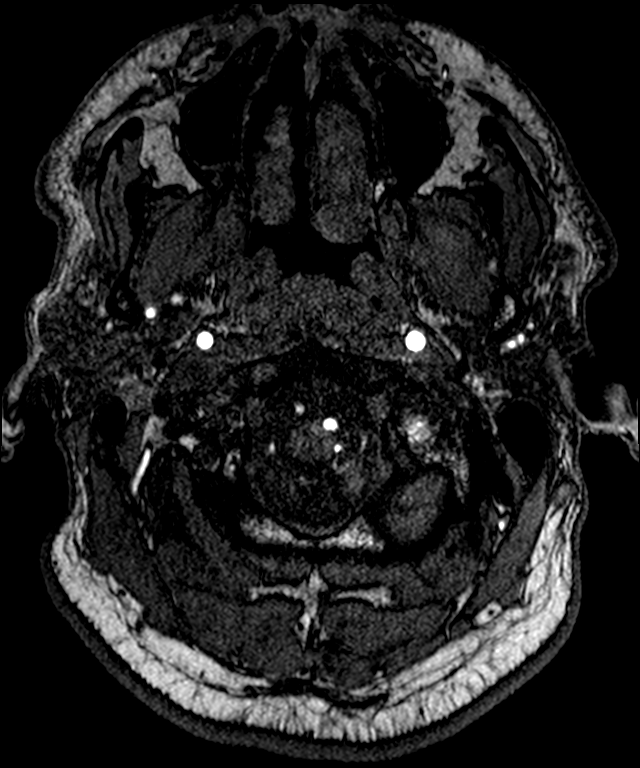
[im 26/149]
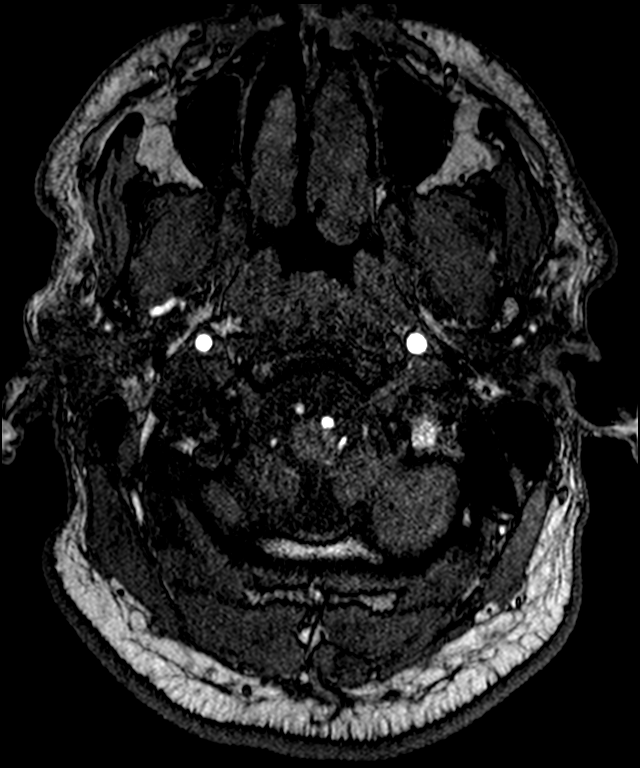
[im 46/149]
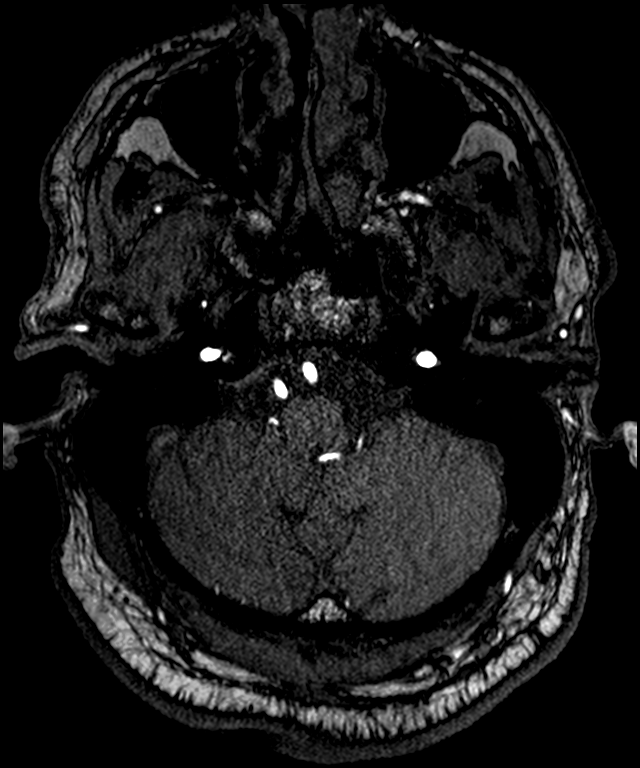
[im 65/149]
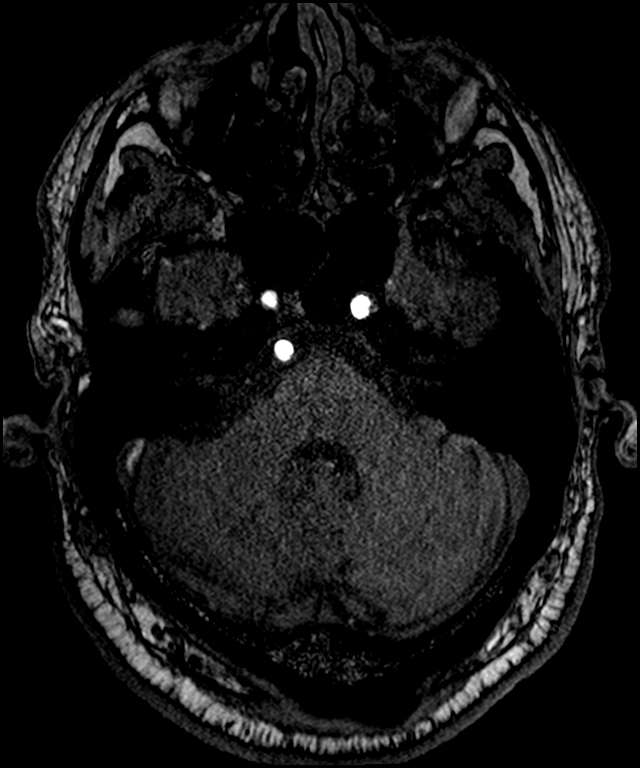
[im 75/149]
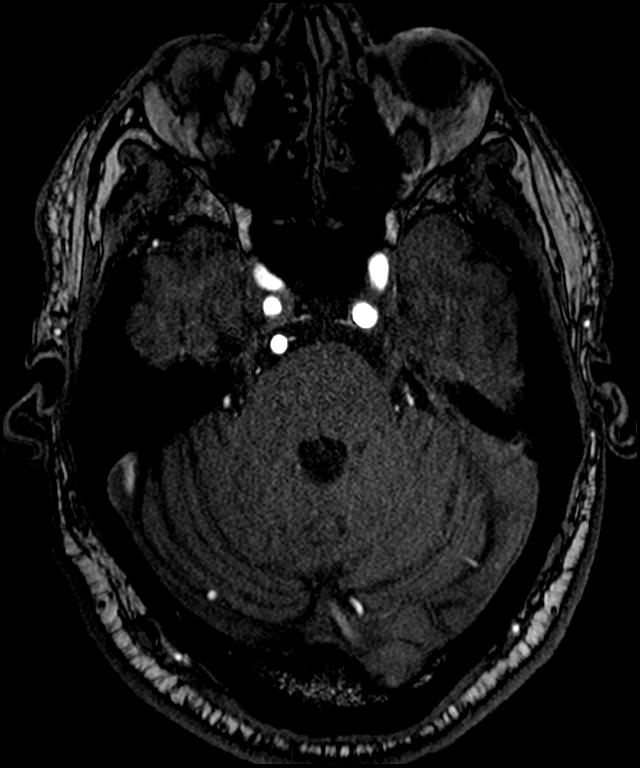
[im 84/149]
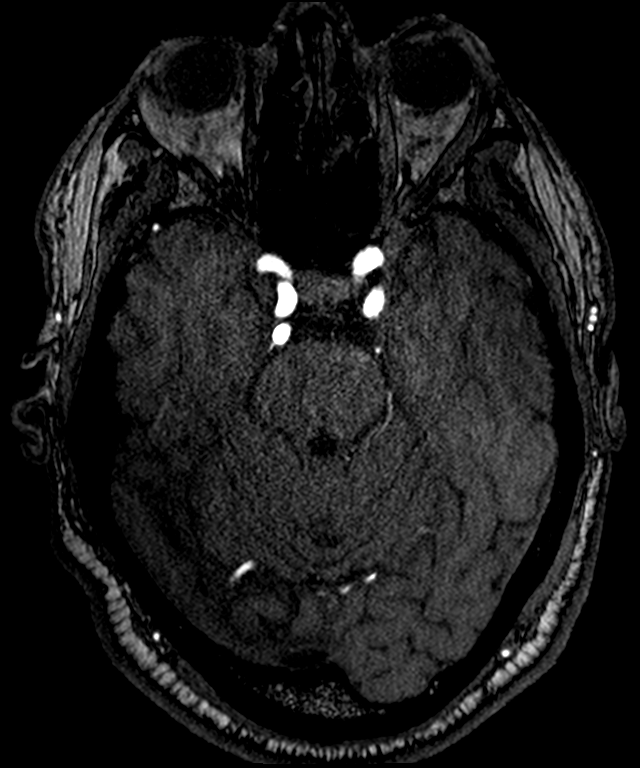
[im 103/149]
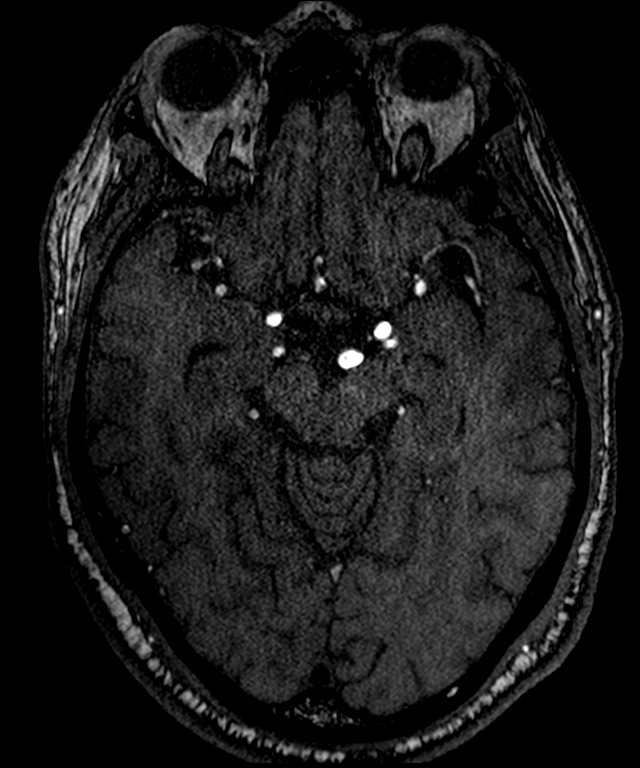
[im 126/149]
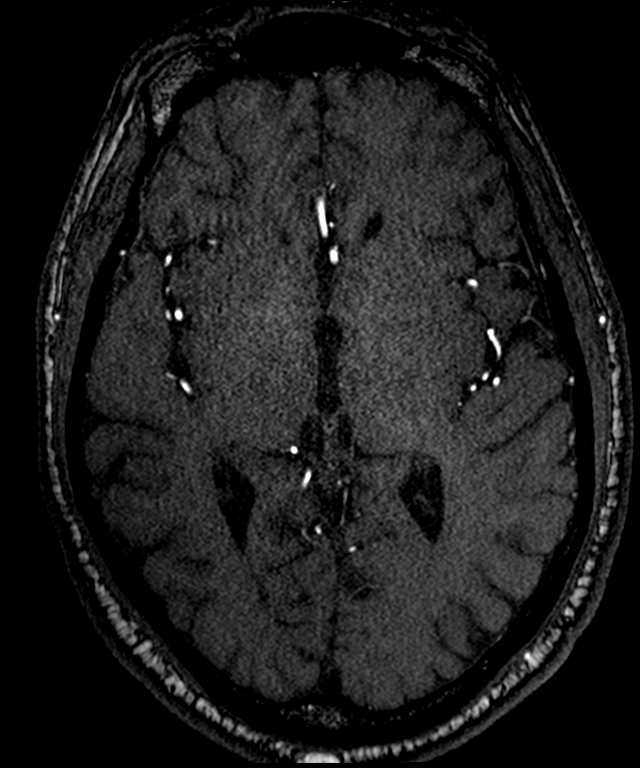

[Series 103: ant tumble · axial · 0.26mm/px · 1 of 2 slices shown]
[im 1/2]
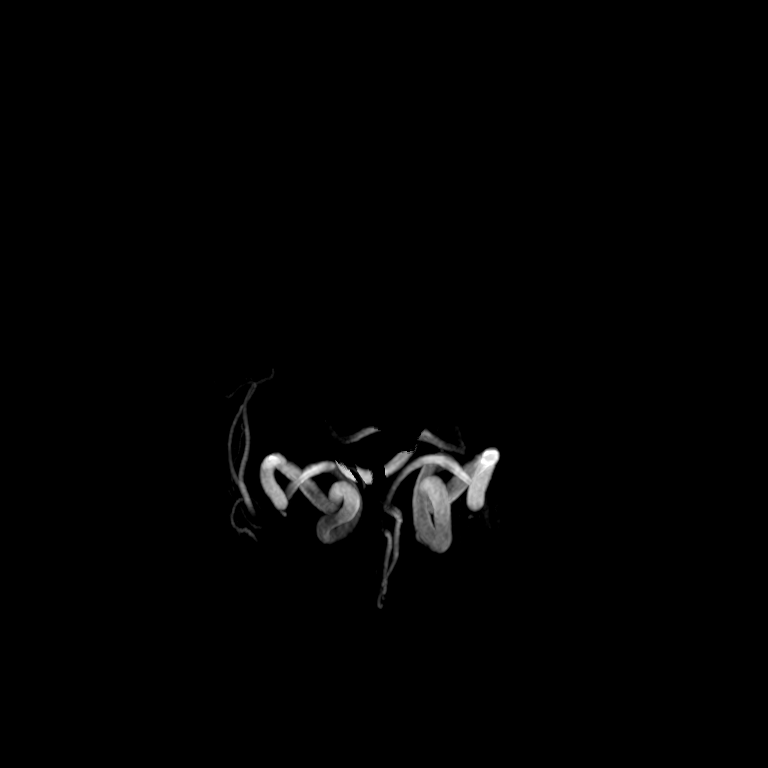

[10 of 48 positions shown; findings below may reference images not displayed]

FINDINGS: Anterior circulation:

The intracranial internal carotid arteries are patent. The M1 middle
cerebral arteries are patent. No M2 proximal branch occlusion or
high-grade proximal stenosis is identified. The anterior cerebral
arteries are patent. The right A1 segment is hypoplastic.

2 mm medially projecting aneurysm arising from the left anterior
cerebral artery near the A1/A2 junction (series 9, image 106)
(series 107, image 126). In retrospect, this was present on prior
examinations dating back to the CTA head of [DATE], and is
unchanged in size since that time.

Unchanged 1-2 mm inferomedially projecting vascular protrusion
arising from the supraclinoid left ICA. More clearly seen on today's
examination, there is an apparent vessel arising from this vascular
protrusion, and this likely reflects an infundibulum (see series 9,
images 89-92).

Posterior circulation:

The intracranial vertebral arteries are patent. The basilar artery
is patent. The posterior cerebral arteries are patent. Posterior
communicating arteries are hypoplastic or absent bilaterally.

Anatomic variants: As described
IMPRESSION: Unchanged 1-2 mm inferomedially projecting vascular protrusion
arising from the supraclinoid left ICA. More clearly seen on today's
examination, there is an apparent vessel arising from this vascular
protrusion, and this likely reflects an infundibulum.

Unchanged 2 mm medially projecting aneurysm arising from the left
anterior cerebral artery near the A1/A2 junction.

No intracranial large vessel occlusion or proximal high-grade
arterial stenosis.

## 2020-12-07 ENCOUNTER — Telehealth: Payer: Self-pay

## 2020-12-07 DIAGNOSIS — I671 Cerebral aneurysm, nonruptured: Secondary | ICD-10-CM

## 2020-12-07 NOTE — Telephone Encounter (Signed)
Pt transferred to schedule his one year f/u appt.   MRA head one year repeat ordered.

## 2020-12-07 NOTE — Telephone Encounter (Signed)
-----   Message from Drema Dallas, DO sent at 12/07/2020  7:19 AM EDT ----- Everything looks stable.  One of the aneurysms previously seen is an outpouching of the blood vessel but not actually an aneurysm.  The other aneurysm is still small and unchanged.  I would repeat MRA of head again in one year with follow up afterwards.

## 2020-12-09 ENCOUNTER — Ambulatory Visit: Payer: Self-pay

## 2020-12-09 ENCOUNTER — Ambulatory Visit (INDEPENDENT_AMBULATORY_CARE_PROVIDER_SITE_OTHER): Payer: Worker's Compensation | Admitting: Orthopedic Surgery

## 2020-12-09 ENCOUNTER — Encounter: Payer: Self-pay | Admitting: Orthopedic Surgery

## 2020-12-09 ENCOUNTER — Other Ambulatory Visit: Payer: Self-pay

## 2020-12-09 DIAGNOSIS — M79604 Pain in right leg: Secondary | ICD-10-CM

## 2020-12-09 DIAGNOSIS — M25512 Pain in left shoulder: Secondary | ICD-10-CM

## 2020-12-09 NOTE — Addendum Note (Signed)
Addended byPrescott Parma on: 12/09/2020 04:01 PM   Modules accepted: Orders

## 2020-12-09 NOTE — Telephone Encounter (Signed)
Sent the approval to [facility] to schedule with authorization, taylor manuel.

## 2020-12-09 NOTE — Progress Notes (Signed)
Office Visit Note   Patient: Curtis Williamson           Date of Birth: May 10, 1960           MRN: [id number] Visit Date: 12/09/2020 Requested by: [name], MD [address], [place],  [place] [address] PCP: [name], MD  Subjective: Chief Complaint  Patient presents with   Right Knee - Pain   Right Hip - Pain    HPI: Curtis Williamson is a 60 year old patient with left shoulder pain.  He is about 4 and half months out left shoulder rotator cuff repair.  Case manager present for discussion of treatment plan today.  He has had an FCE done since he was last seen.  Hard for him to lift overhead.  He drives a truck.  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              ROS: All systems reviewed are negative as they relate to the chief complaint within the history of present illness.  Patient denies  fevers or chills.   Assessment & Plan: Visit Diagnoses:  1. Pain in right leg     Plan: Impression is left shoulder pain which is not unexpected following rotator cuff repair.  All in all his shoulder looks good.  Based on the FCE which I have not reviewed yet we will need to make job recommendations.  For now until I look at the FCE work in ago with no overhead lifting for 4 months and no lifting more than 20 pounds with that left arm for 4 months.  53-month return for final check and release.  Follow-Up Instructions: Return in about 4 months (around 04/10/2021).   Orders:  Orders Placed This Encounter  Procedures   XR KNEE 3 VIEW RIGHT   XR HIP UNILAT W OR W/O PELVIS 2-3 VIEWS RIGHT   No orders of the defined types were placed in this encounter.     Procedures: No procedures performed   Clinical Data: No additional findings.  Objective: Vital Signs: There were no vitals taken for this visit.  Physical Exam:   Constitutional: Patient appears well-developed HEENT:  Head: Normocephalic Eyes:EOM are normal Neck:  Normal range of motion Cardiovascular: Normal rate Pulmonary/chest: Effort normal Neurologic: Patient is alert Skin: Skin is warm Psychiatric: Patient has normal mood and affect   Ortho Exam: Ortho exam demonstrates good cervical spine range of motion.  Left shoulder passive range of motion is 30/90/150.  Rotator cuff strength is good infraspinatus supraspinatus and subscap muscle testing.  No coarse grinding or crepitus with internal and external rotation of the arm at 90 degrees of abduction.  Specialty Comments:  No specialty comments available.  Imaging: No results found.   PMFS History: Patient Active Problem List   Diagnosis Date Noted   Low back pain 10/28/2020   Pain due to onychomycosis of toenails of both feet 10/09/2020   Hyperlipidemia 09/22/2020   Thickened nails 09/22/2020   Insomnia 09/22/2020   Acute pain of left shoulder 05/19/2020   Tobacco dependence 03/26/2019   Essential hypertension 03/26/2019   Type 2 diabetes mellitus without complication, without long-term current use of insulin (HCC) 03/26/2019   Aneurysm, cerebral, nonruptured 03/26/2019   Past Medical History:  Diagnosis Date   Chronic kidney disease    only has one kidney   Diabetes mellitus without complication (HCC)    Essential hypertension    Hyperlipidemia    Hypertension  Phreesia 01/13/2020    Family History  Problem Relation Age of Onset   Hypertension Mother    Kidney failure Mother    Kidney disease Mother    Heart disease Mother    ADD / ADHD Daughter    Anxiety disorder Daughter    Alcohol abuse Father    Colon cancer Neg Hx    Stomach cancer Neg Hx    Esophageal cancer Neg Hx    Colon polyps Neg Hx     Past Surgical History:  Procedure Laterality Date   KIDNEY DONATION     MANDIBLE FRACTURE SURGERY     ROTATOR CUFF REPAIR Right    Social History   Occupational History   Occupation: Truck driver  Tobacco Use   Smoking status: Every Day    Packs/day: 0.25     Years: 30.00    Pack years: 7.50    Types: Cigarettes   Smokeless tobacco: Never   Tobacco comments:    off and on, has tried to quit  Vaping Use   Vaping Use: Never used  Substance and Sexual Activity   Alcohol use: Yes    Comment: rarely   Drug use: Never   Sexual activity: Not Currently

## 2020-12-16 ENCOUNTER — Other Ambulatory Visit: Payer: Self-pay | Admitting: Physician Assistant

## 2020-12-20 NOTE — Progress Notes (Signed)
Patient ID: Curtis Williamson, male    DOB: 1961/02/21  MRN: [id number]  CC: Hypertension and Diabetes Follow-Up   Subjective: [name] is a 60 y.o. male who presents for hypertension and diabetes follow-up.   His concerns today include:   HYPERTENSION FOLLOW-UP: 3/28/202 per DO note: BP well controlled. Asymptomatic. Continue current regimen.   12/23/2020: Currently taking: see medication list Med Adherence: [x]  Yes    []  No Medication side effects: []  Yes    [x]  No Adherence with salt restriction (low-salt diet): [x]  Yes  []  No Exercise: Yes []  No [x]  Home Monitoring?: []  Yes    [x]  No Monitoring Frequency: []  Yes    [x]  No Home BP results range: []  Yes    [x]  No SOB? []  Yes    [x]  No Chest Pain?: []  Yes    [x]  No  2. DIABETES TYPE 2 FOLLOW-UP: 07/13/2020 per DO note: A1c significantly improved from >10 to 7.2. Near goal. Encouraged continued compliance and dietary adherence. Monitor in 3 months.   12/23/2020: Not taking Metformin and Jardiance consistently as prescribed. Reports feeling dizzy or lightheaded sometimes after taking medication. Not checking blood sugars. Endorses eating diet consisting of potatoes.   3. NASAL CONGESTION FOLLOW-UP: Requesting refills of Flonase.   Patient Active Problem List   Diagnosis Date Noted   Low back pain 10/28/2020   Pain due to onychomycosis of toenails of both feet 10/09/2020   Hyperlipidemia 09/22/2020   Thickened nails 09/22/2020   Insomnia 09/22/2020   Acute pain of left shoulder 05/19/2020   Tobacco dependence 03/26/2019   Essential hypertension 03/26/2019   Type 2 diabetes mellitus without complication, without long-term current use of insulin (Curtis Williamson) 03/26/2019   Aneurysm, cerebral, nonruptured 03/26/2019     Current Outpatient Medications on File Prior to Visit  Medication Sig Dispense Refill   Accu-Chek Softclix Lancets lancets Use as instructed 100 each 12   acetaminophen-codeine (TYLENOL #3) 300-30 MG  tablet Take 1 tablet by mouth every 12 (twelve) hours as needed for moderate pain. 30 tablet 0   atorvastatin (LIPITOR) 10 MG tablet Take 1 tablet (10 mg total) by mouth daily. 90 tablet 1   baclofen (LIORESAL) 10 MG tablet Take 0.5-1 tablets (5-10 mg total) by mouth 3 (three) times daily as needed for muscle spasms. (Patient not taking: Reported on 11/06/2020) 30 each 3   Blood Glucose Monitoring Suppl (ACCU-CHEK GUIDE ME) w/Device KIT 1 each by Does not apply route 2 (two) times daily. Use to check FSBS twice a day. Dx: E11.9 1 kit 0   chlorzoxazone (PARAFON) 500 MG tablet TAKE 1 TABLET BY MOUTH THREE TIMES A DAY AS NEEDED FOR MUSCLE SPASMS 90 tablet 1   docusate sodium (COLACE) 100 MG capsule Take 1 capsule (100 mg total) by mouth 2 (two) times daily. 60 capsule 0   gabapentin (NEURONTIN) 300 MG capsule TAKE 1 CAPSULE BY MOUTH AT BEDTIME , MAY INCREASE TO 1 CAPSULE BY MOUTH THREE TIMES A DAY IF NEEDED (Patient taking differently: Take 300 mg by mouth 3 (three) times daily. TAKE 1 CAPSULE BY MOUTH AT BEDTIME , MAY INCREASE TO 1 CAPSULE BY MOUTH THREE TIMES A DAY IF NEEDED) 90 capsule 3   glucose blood (ACCU-CHEK GUIDE) test strip Use as instructed 50 strip 7   hydrOXYzine (ATARAX/VISTARIL) 25 MG tablet Take 1 tablet (25 mg total) by mouth every 6 (six) hours. 180 tablet 1   traZODone (DESYREL) 50 MG tablet TAKE 0.5-1 TABLETS (25-50 MG TOTAL)  BY MOUTH AT BEDTIME AS NEEDED FOR SLEEP. 90 tablet 1   triamcinolone cream (KENALOG) 0.1 % Apply 1 application topically 2 (two) times daily. 30 g 2   No current facility-administered medications on file prior to visit.    Allergies  Allergen Reactions   Mushroom Extract Complex     Social History   Socioeconomic History   Marital status: Single    Spouse name: Not on file   Number of children: 1   Years of education: Not on file   Highest education level: Some college, no degree  Occupational History   Occupation: Truck driver  Tobacco Use    Smoking status: Every Day    Packs/day: 0.25    Years: 30.00    Pack years: 7.50    Types: Cigarettes   Smokeless tobacco: Never   Tobacco comments:    off and on, has tried to quit  Vaping Use   Vaping Use: Never used  Substance and Sexual Activity   Alcohol use: Yes    Comment: rarely   Drug use: Never   Sexual activity: Not Currently  Other Topics Concern   Not on file  Social History Narrative   Right handed   One story home   No caffeine    Social Determinants of Health   Financial Resource Strain: Not on file  Food Insecurity: Not on file  Transportation Needs: Not on file  Physical Activity: Not on file  Stress: Not on file  Social Connections: Not on file  Intimate Partner Violence: Not on file    Family History  Problem Relation Age of Onset   Hypertension Mother    Kidney failure Mother    Kidney disease Mother    Heart disease Mother    ADD / ADHD Daughter    Anxiety disorder Daughter    Alcohol abuse Father    Colon cancer Neg Hx    Stomach cancer Neg Hx    Esophageal cancer Neg Hx    Colon polyps Neg Hx     Past Surgical History:  Procedure Laterality Date   KIDNEY DONATION     MANDIBLE FRACTURE SURGERY     ROTATOR CUFF REPAIR Right     ROS: Review of Systems Negative except as stated above  PHYSICAL EXAM: BP 119/83 (BP Location: Left Arm, Patient Position: Sitting, Cuff Size: Large)   Pulse 97   Temp 97.9 F (36.6 C)   Resp 18   Ht 5' 10.98" (1.803 m)   Wt 193 lb 9.6 oz (87.8 kg)   SpO2 95%   BMI 27.01 kg/m   Physical Exam HENT:     Head: Normocephalic and atraumatic.  Eyes:     Extraocular Movements: Extraocular movements intact.     Conjunctiva/sclera: Conjunctivae normal.     Pupils: Pupils are equal, round, and reactive to light.  Cardiovascular:     Rate and Rhythm: Normal rate and regular rhythm.     Pulses: Normal pulses.     Heart sounds: Normal heart sounds.  Pulmonary:     Effort: Pulmonary effort is normal.      Breath sounds: Normal breath sounds.  Musculoskeletal:     Cervical back: Normal range of motion and neck supple.  Neurological:     General: No focal deficit present.     Mental Status: He is alert and oriented to person, place, and time.  Psychiatric:        Mood and Affect: Mood normal.  Behavior: Behavior normal.   ASSESSMENT AND PLAN: 1. Essential hypertension: - Continue Lisinopril-Hydrochlorothiazide as prescribed. - Counseled on blood pressure goal of less than 130/80, low-sodium, DASH diet, medication compliance, 150 minutes of moderate intensity exercise per week as tolerated. Discussed medication compliance, adverse effects. - Counseled to check blood pressures consistently at home. - BMP to evaluate kidney function and electrolyte balance. - Follow-up with primary provider in 3 months or sooner if needed.  - Basic Metabolic Panel - lisinopril-hydrochlorothiazide (ZESTORETIC) 10-12.5 MG tablet; Take 1 tablet by mouth daily.  Dispense: 90 tablet; Refill: 0  3. Type 2 diabetes mellitus without complication, without long-term current use of insulin ([place]): - Hemoglobin A1c today not at goal at 9.9%, goal < 7%. This is increased from previous hemoglobin A1c of 7.5% on 09/22/2020.  - Patient endorses dietary indiscretion.  - Patient reports not consistently taking Metformin and Empagliflozin as prescribed reporting side effects of dizziness and concerns for the blood sugars being too low.  - Counseled to take Metformin and Empagliflozin as prescribed. - Counseled to take Metformin at breakfast and dinner. Counseled to take Empagliflozin around 12 pm or 1 pm. Will trial this plan to see if curbs side effects of dizziness.  - Discussed the importance of healthy eating habits, low-carbohydrate diet, low-sugar diet, regular aerobic exercise (at least 150 minutes a week as tolerated) and medication compliance to achieve or maintain control of diabetes. - Counseled to check blood  sugars consistently at home. - Follow-up with primary provider in 4 weeks or sooner if needed.  - POCT glycosylated hemoglobin (Hb A1C) - metFORMIN (GLUCOPHAGE) 1000 MG tablet; Take 1 tablet (1,000 mg total) by mouth 2 (two) times daily with a meal.  Dispense: 180 tablet; Refill: 0 - empagliflozin (JARDIANCE) 10 MG TABS tablet; Take 1 tablet (10 mg total) by mouth daily before breakfast.  Dispense: 90 tablet; Refill: 0  4. Nasal congestion: - Continue Fluticasone as prescribed.  - fluticasone (FLONASE) 50 MCG/ACT nasal spray; Place 2 sprays into both nostrils daily.  Dispense: 16 g; Refill: 0    Patient was given the opportunity to ask questions.  Patient verbalized understanding of the plan and was able to repeat key elements of the plan. Patient was given clear instructions to go to Emergency Department or return to medical center if symptoms don't improve, worsen, or new problems develop.The patient verbalized understanding.   Orders Placed This Encounter  Procedures   Basic Metabolic Panel   POCT glycosylated hemoglobin (Hb A1C)     Requested Prescriptions   Signed Prescriptions Disp Refills   lisinopril-hydrochlorothiazide (ZESTORETIC) 10-12.5 MG tablet 90 tablet 0    Sig: Take 1 tablet by mouth daily.   metFORMIN (GLUCOPHAGE) 1000 MG tablet 180 tablet 0    Sig: Take 1 tablet (1,000 mg total) by mouth 2 (two) times daily with a meal.   empagliflozin (JARDIANCE) 10 MG TABS tablet 90 tablet 0    Sig: Take 1 tablet (10 mg total) by mouth daily before breakfast.   fluticasone (FLONASE) 50 MCG/ACT nasal spray 16 g 0    Sig: Place 2 sprays into both nostrils daily.    Return in about 4 weeks (around 01/20/2021) for Dorna Mai, MD for diabetes .  Camillia Herter, NP

## 2020-12-23 ENCOUNTER — Telehealth: Payer: Self-pay | Admitting: Orthopedic Surgery

## 2020-12-23 ENCOUNTER — Other Ambulatory Visit: Payer: Self-pay

## 2020-12-23 ENCOUNTER — Encounter: Payer: Self-pay | Admitting: Family

## 2020-12-23 ENCOUNTER — Ambulatory Visit (INDEPENDENT_AMBULATORY_CARE_PROVIDER_SITE_OTHER): Payer: Medicaid Other | Admitting: Family

## 2020-12-23 VITALS — BP 119/83 | HR 97 | Temp 97.9°F | Resp 18 | Ht 70.98 in | Wt 193.6 lb

## 2020-12-23 DIAGNOSIS — R0981 Nasal congestion: Secondary | ICD-10-CM

## 2020-12-23 DIAGNOSIS — E119 Type 2 diabetes mellitus without complications: Secondary | ICD-10-CM

## 2020-12-23 DIAGNOSIS — I10 Essential (primary) hypertension: Secondary | ICD-10-CM

## 2020-12-23 DIAGNOSIS — Z7689 Persons encountering health services in other specified circumstances: Secondary | ICD-10-CM

## 2020-12-23 LAB — POCT GLYCOSYLATED HEMOGLOBIN (HGB A1C): Hemoglobin A1C: 9.9 % — AB (ref 4.0–5.6)

## 2020-12-23 MED ORDER — EMPAGLIFLOZIN 10 MG PO TABS: 10.0000 mg | ORAL_TABLET | Freq: Every day | ORAL | 0 refills | Status: DC

## 2020-12-23 MED ORDER — METFORMIN HCL 1000 MG PO TABS: 1000.0000 mg | ORAL_TABLET | Freq: Two times a day (BID) | ORAL | 0 refills | Status: DC

## 2020-12-23 MED ORDER — LISINOPRIL-HYDROCHLOROTHIAZIDE 10-12.5 MG PO TABS: 1.0000 | ORAL_TABLET | Freq: Every day | ORAL | 0 refills | Status: DC

## 2020-12-23 MED ORDER — FLUTICASONE PROPIONATE 50 MCG/ACT NA SUSP: 2.0000 | Freq: Every day | NASAL | 0 refills | Status: DC

## 2020-12-23 NOTE — Progress Notes (Signed)
Pt presents for hypertension and diabetes follow-up, needs refills on Lisinopril-HCTZ, Flonase

## 2020-12-23 NOTE — Telephone Encounter (Signed)
Pt called requesting a refill of tylenol 3. Please send to pharmacy on file. Pt asking for call when medication is called in. Pt phone number is [phone].

## 2020-12-24 ENCOUNTER — Other Ambulatory Visit: Payer: Self-pay | Admitting: Surgical

## 2020-12-24 LAB — BASIC METABOLIC PANEL
BUN/Creatinine Ratio: 12 (ref 10–24)
BUN: 15 mg/dL (ref 8–27)
CO2: 25 mmol/L (ref 20–29)
Calcium: 10.6 mg/dL — ABNORMAL HIGH (ref 8.6–10.2)
Chloride: 97 mmol/L (ref 96–106)
Creatinine, Ser: 1.27 mg/dL (ref 0.76–1.27)
Glucose: 231 mg/dL — ABNORMAL HIGH (ref 65–99)
Potassium: 4.4 mmol/L (ref 3.5–5.2)
Sodium: 139 mmol/L (ref 134–144)
eGFR: 64 mL/min/{1.73_m2} (ref >59)

## 2020-12-24 MED ORDER — GABAPENTIN 300 MG PO CAPS: 300.0000 mg | ORAL_CAPSULE | Freq: Three times a day (TID) | ORAL | 0 refills | Status: DC

## 2020-12-24 MED ORDER — MELOXICAM 15 MG PO TABS
15.0000 mg | ORAL_TABLET | Freq: Every day | ORAL | 0 refills | Status: DC
Start: 2020-12-24 — End: 2021-01-21

## 2020-12-24 NOTE — Progress Notes (Signed)
Kidney function normal.   Calcium mildly elevated. Plan to recheck at next visit 01/20/2021.  Diabetes discussed in office.

## 2020-12-24 NOTE — Telephone Encounter (Signed)
Pt called again asking about medication and would like a call when called in.

## 2020-12-24 NOTE — Telephone Encounter (Signed)
He is several months out from rotator cuff repair, do not think maintain him on opioid medication is in his best long-term interest.  Plan to prescribe NSAID and gabapentin.  He may take these medications with Tylenol over-the-counter.  If he is having continued pain after this, he may call the office.

## 2020-12-25 NOTE — Telephone Encounter (Signed)
Contacted patient and made him aware that requested medication is not able to be filled. Patient states that he will try taking MOBIC to see if this will help manage his pain.

## 2021-01-06 ENCOUNTER — Other Ambulatory Visit: Payer: Self-pay

## 2021-01-06 ENCOUNTER — Encounter: Payer: Self-pay | Admitting: Emergency Medicine

## 2021-01-06 ENCOUNTER — Ambulatory Visit
Admission: EM | Admit: 2021-01-06 | Discharge: 2021-01-06 | Disposition: A | Discharge status: Home / Self Care | Payer: Medicaid Other | Attending: Urgent Care | Admitting: Urgent Care

## 2021-01-06 DIAGNOSIS — E1122 Type 2 diabetes mellitus with diabetic chronic kidney disease: Secondary | ICD-10-CM

## 2021-01-06 DIAGNOSIS — E119 Type 2 diabetes mellitus without complications: Secondary | ICD-10-CM

## 2021-01-06 DIAGNOSIS — H0289 Other specified disorders of eyelid: Secondary | ICD-10-CM

## 2021-01-06 DIAGNOSIS — N189 Chronic kidney disease, unspecified: Secondary | ICD-10-CM

## 2021-01-06 DIAGNOSIS — L03213 Periorbital cellulitis: Secondary | ICD-10-CM

## 2021-01-06 MED ORDER — CEFDINIR 300 MG PO CAPS: 300.0000 mg | ORAL_CAPSULE | Freq: Two times a day (BID) | ORAL | 0 refills | Status: DC

## 2021-01-06 NOTE — ED Triage Notes (Signed)
Woke up last night, felt something under his right eyelid, didn't see anything in there, has felt like something was in eye since. Reports visual changes since

## 2021-01-06 NOTE — ED Provider Notes (Signed)
[name]    CSN: [id number] Arrival date & time: 01/06/21  0817      History   Chief Complaint Chief Complaint  Patient presents with   Eye Problem    HPI Curtis Williamson is a 60 y.o. male with pmh of uncontrolled diabetes treated without insulin, essential hypertension, cerebral aneurysm nonruptured, CKD presenting for 2-day history of acute onset right eye watering, drainage, feels like he has mucus stuck under his right upper eyelid.  Has had swelling of the right upper eyelid.  Reports that his girlfriend has washed his eye out multiple times with very temporary relief.  No fever, trauma, eye redness, vision change, work with dust or foreign bodies that could have gotten into his eye.  He is actually not working right now.  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  Last creatinine levels have been normal since 09/2019.  HPI  Past Medical History:  Diagnosis Date   Chronic kidney disease    only has one kidney   Diabetes mellitus without complication ([place])    Essential hypertension    Hyperlipidemia    Hypertension    Phreesia 01/13/2020    Patient Active Problem List   Diagnosis Date Noted   Low back pain 10/28/2020   Pain due to onychomycosis of toenails of both feet 10/09/2020   Hyperlipidemia 09/22/2020   Thickened nails 09/22/2020   Insomnia 09/22/2020   Acute pain of left shoulder 05/19/2020   Tobacco dependence 03/26/2019   Essential hypertension 03/26/2019   Type 2 diabetes mellitus without complication, without long-term current use of insulin ([place]) 03/26/2019   Aneurysm, cerebral, nonruptured 03/26/2019    Past Surgical History:  Procedure Laterality Date   KIDNEY DONATION     MANDIBLE FRACTURE SURGERY     ROTATOR CUFF REPAIR Right        Home Medications    Prior to Admission medications   Medication Sig Start Date End Date Taking? Authorizing Provider   Accu-Chek Softclix Lancets lancets Use as instructed 09/22/20   Mayers, Cari S, PA-C  acetaminophen-codeine (TYLENOL #3) 300-30 MG tablet Take 1 tablet by mouth every 12 (twelve) hours as needed for moderate pain. 09/09/20   Meredith Pel, MD  atorvastatin (LIPITOR) 10 MG tablet Take 1 tablet (10 mg total) by mouth daily. 09/22/20   Mayers, Cari S, PA-C  baclofen (LIORESAL) 10 MG tablet Take 0.5-1 tablets (5-10 mg total) by mouth 3 (three) times daily as needed for muscle spasms. Patient not taking: Reported on 11/06/2020 01/20/20   Hilts, Legrand Como, MD  Blood Glucose Monitoring Suppl (ACCU-CHEK GUIDE ME) w/Device KIT 1 each by Does not apply route 2 (two) times daily. Use to check FSBS twice a day. Dx: E11.9 09/22/20   Mayers, Cari S, PA-C  chlorzoxazone (PARAFON) 500 MG tablet TAKE 1 TABLET BY MOUTH THREE TIMES A DAY AS NEEDED FOR MUSCLE SPASMS 10/21/20   Hilts, Legrand Como, MD  docusate sodium (COLACE) 100 MG capsule Take 1 capsule (100 mg total) by mouth 2 (two) times daily. 07/10/20 07/10/21  Magnant, Charles L, PA-C  empagliflozin (JARDIANCE) 10 MG TABS tablet Take 1 tablet (10 mg total) by mouth daily before breakfast. 12/23/20   Camillia Herter, NP  fluticasone (FLONASE) 50 MCG/ACT nasal spray Place 2 sprays into both nostrils daily. 12/23/20   Camillia Herter, NP  gabapentin (NEURONTIN) 300 MG capsule Take 1 capsule (300 mg total) by mouth  3 (three) times daily. 12/24/20 12/24/21  Magnant, Charles L, PA-C  glucose blood (ACCU-CHEK GUIDE) test strip Use as instructed 09/22/20   Mayers, Cari S, PA-C  hydrOXYzine (ATARAX/VISTARIL) 25 MG tablet Take 1 tablet (25 mg total) by mouth every 6 (six) hours. 11/19/20   Mayers, Cari S, PA-C  lisinopril-hydrochlorothiazide (ZESTORETIC) 10-12.5 MG tablet Take 1 tablet by mouth daily. 12/23/20   Camillia Herter, NP  meloxicam (MOBIC) 15 MG tablet Take 1 tablet (15 mg total) by mouth daily. 12/24/20   Magnant, Charles L, PA-C  metFORMIN (GLUCOPHAGE) 1000 MG tablet Take 1 tablet (1,000  mg total) by mouth 2 (two) times daily with a meal. 12/23/20   Camillia Herter, NP  traZODone (DESYREL) 50 MG tablet TAKE 0.5-1 TABLETS (25-50 MG TOTAL) BY MOUTH AT BEDTIME AS NEEDED FOR SLEEP. 10/20/20   Mayers, Cari S, PA-C  triamcinolone cream (KENALOG) 0.1 % Apply 1 application topically 2 (two) times daily. 07/11/19   Nicolette Bang, MD    Family History Family History  Problem Relation Age of Onset   Hypertension Mother    Kidney failure Mother    Kidney disease Mother    Heart disease Mother    ADD / ADHD Daughter    Anxiety disorder Daughter    Alcohol abuse Father    Colon cancer Neg Hx    Stomach cancer Neg Hx    Esophageal cancer Neg Hx    Colon polyps Neg Hx     Social History Social History   Tobacco Use   Smoking status: Every Day    Packs/day: 0.25    Years: 30.00    Pack years: 7.50    Types: Cigarettes   Smokeless tobacco: Never   Tobacco comments:    off and on, has tried to quit  Vaping Use   Vaping Use: Never used  Substance Use Topics   Alcohol use: Yes    Comment: rarely   Drug use: Never     Allergies   Mushroom extract complex   Review of Systems Review of Systems   Physical Exam Triage Vital Signs ED Triage Vitals  Enc Vitals Group     BP 01/06/21 0843 136/88     Pulse Rate 01/06/21 0843 78     Resp 01/06/21 0843 16     Temp 01/06/21 0843 97.8 F (36.6 C)     Temp Source 01/06/21 0843 Oral     SpO2 01/06/21 0843 94 %     Weight --      Height --      Head Circumference --      Peak Flow --      Pain Score 01/06/21 0844 7     Pain Loc --      Pain Edu? --      Excl. in [place]? --    No data found.  Updated Vital Signs BP 136/88 (BP Location: Left Arm)   Pulse 78   Temp 97.8 F (36.6 C) (Oral)   Resp 16   SpO2 94%   Visual Acuity Right Eye Distance: 20/30 Left Eye Distance: 20/40 Bilateral Distance: 20/25  Right Eye Near:   Left Eye Near:    Bilateral Near:     Physical Exam Constitutional:       General: He is not in acute distress.    Appearance: Normal appearance. He is well-developed and normal weight. He is not ill-appearing, toxic-appearing or diaphoretic.  HENT:     Head: Normocephalic  and atraumatic.     Right Ear: External ear normal.     Left Ear: External ear normal.     Nose: Nose normal.     Mouth/Throat:     Pharynx: Oropharynx is clear.  Eyes:     General: Lids are everted, no foreign bodies appreciated. Vision grossly intact. Gaze aligned appropriately. No scleral icterus.       Right eye: Discharge (watering) present. No foreign body or hordeolum.        Left eye: No foreign body, discharge or hordeolum.     Extraocular Movements: Extraocular movements intact.     Conjunctiva/sclera:     Right eye: Right conjunctiva is not injected. No chemosis, exudate or hemorrhage.    Left eye: Left conjunctiva is not injected. No chemosis, exudate or hemorrhage.    Pupils: Pupils are equal, round, and reactive to light.   Cardiovascular:     Rate and Rhythm: Normal rate.  Pulmonary:     Effort: Pulmonary effort is normal.  Musculoskeletal:     Cervical back: Normal range of motion.  Skin:    General: Skin is warm and dry.  Neurological:     Mental Status: He is alert and oriented to person, place, and time.  Psychiatric:        Mood and Affect: Mood normal.        Behavior: Behavior normal.        Thought Content: Thought content normal.        Judgment: Judgment normal.     UC Treatments / Results  Labs (all labs ordered are listed, but only abnormal results are displayed) Labs Reviewed - No data to display  EKG   Radiology No results found.  Procedures Procedures (including critical care time)  Medications Ordered in UC Medications - No data to display  Initial Impression / Assessment and Plan / UC Course  I have reviewed the triage vital signs and the nursing notes.  Pertinent labs & imaging results that were available during my care of the  patient were reviewed by me and considered in my medical decision making (see chart for details).     I will manage patient for preseptal cellulitis of the right upper eyelid given lack of trauma, physical exam findings of right upper eyelid swelling with tenderness.  The conjunctive itself appears normal.  His vision is grossly intact.  Start cefdinir, did not make any dosage adjustments despite having 1 kidney given his creatinine levels.  Recommended close follow-up in 2 days with Dr. Stacie Glaze. Counseled patient on potential for adverse effects with medications prescribed/recommended today, ER and return-to-clinic precautions discussed, patient verbalized understanding.  Final Clinical Impressions(s) / UC Diagnoses   Final diagnoses:  None   Discharge Instructions   None    ED Prescriptions   None    PDMP not reviewed this encounter.   Jaynee Eagles, [place] 01/06/21 [phone]

## 2021-01-09 ENCOUNTER — Other Ambulatory Visit: Payer: Self-pay | Admitting: Physician Assistant

## 2021-01-09 DIAGNOSIS — E785 Hyperlipidemia, unspecified: Secondary | ICD-10-CM

## 2021-01-14 ENCOUNTER — Other Ambulatory Visit: Payer: Self-pay | Admitting: Family

## 2021-01-14 DIAGNOSIS — R0981 Nasal congestion: Secondary | ICD-10-CM

## 2021-01-15 ENCOUNTER — Ambulatory Visit (INDEPENDENT_AMBULATORY_CARE_PROVIDER_SITE_OTHER): Payer: Medicaid Other | Admitting: Podiatry

## 2021-01-15 ENCOUNTER — Encounter: Payer: Self-pay | Admitting: Podiatry

## 2021-01-15 ENCOUNTER — Other Ambulatory Visit: Payer: Self-pay

## 2021-01-15 DIAGNOSIS — B351 Tinea unguium: Secondary | ICD-10-CM

## 2021-01-15 DIAGNOSIS — M79675 Pain in left toe(s): Secondary | ICD-10-CM

## 2021-01-15 DIAGNOSIS — M79674 Pain in right toe(s): Secondary | ICD-10-CM | POA: Diagnosis not present

## 2021-01-15 DIAGNOSIS — E119 Type 2 diabetes mellitus without complications: Secondary | ICD-10-CM

## 2021-01-15 NOTE — Progress Notes (Signed)
This patient returns to my office for at risk foot care.  This patient requires this care by a professional since this patient will be at risk due to having diabetes and CKD.    This patient is unable to cut nails himself since the patient cannot reach his nails.These nails are painful walking and wearing shoes.  This patient presents for at risk foot care today. ° °General Appearance  Alert, conversant and in no acute stress. ° °Vascular  Dorsalis pedis and posterior tibial  pulses are palpable  bilaterally.  Capillary return is within normal limits  bilaterally. Temperature is within normal limits  bilaterally. ° °Neurologic  Senn-Weinstein monofilament wire test within normal limits  bilaterally. Muscle power within normal limits bilaterally. ° °Nails Thick disfigured discolored nails with subungual debris  from hallux to fifth toes bilaterally. No evidence of bacterial infection or drainage bilaterally. ° °Orthopedic  No limitations of motion  feet .  No crepitus or effusions noted.  No bony pathology or digital deformities noted.  Bone spur fifth toe right foot. ° °Skin  normotropic skin with no porokeratosis noted bilaterally.  No signs of infections or ulcers noted.    ° °Onychomycosis  Pain in right toes  Pain in left toes ° °Consent was obtained for treatment procedures.   Mechanical debridement of nails 1-5  bilaterally performed with a nail nipper.  Filed with dremel without incident.  ° ° °Return office visit    3 months                 Told patient to return for periodic foot care and evaluation due to potential at risk complications. ° ° °Damiah Mcdonald DPM   °

## 2021-01-17 ENCOUNTER — Emergency Department (HOSPITAL_COMMUNITY)
Admission: EM | Admit: 2021-01-17 | Discharge: 2021-01-18 | Disposition: A | Discharge status: Home / Self Care | Payer: Medicaid Other | Attending: Emergency Medicine | Admitting: Emergency Medicine

## 2021-01-17 DIAGNOSIS — Z7984 Long term (current) use of oral hypoglycemic drugs: Secondary | ICD-10-CM | POA: Insufficient documentation

## 2021-01-17 DIAGNOSIS — R569 Unspecified convulsions: Secondary | ICD-10-CM | POA: Diagnosis not present

## 2021-01-17 DIAGNOSIS — E1165 Type 2 diabetes mellitus with hyperglycemia: Secondary | ICD-10-CM | POA: Diagnosis not present

## 2021-01-17 DIAGNOSIS — F17210 Nicotine dependence, cigarettes, uncomplicated: Secondary | ICD-10-CM | POA: Diagnosis not present

## 2021-01-17 DIAGNOSIS — E1122 Type 2 diabetes mellitus with diabetic chronic kidney disease: Secondary | ICD-10-CM | POA: Insufficient documentation

## 2021-01-17 DIAGNOSIS — R410 Disorientation, unspecified: Secondary | ICD-10-CM | POA: Diagnosis not present

## 2021-01-17 DIAGNOSIS — N189 Chronic kidney disease, unspecified: Secondary | ICD-10-CM | POA: Insufficient documentation

## 2021-01-17 DIAGNOSIS — R42 Dizziness and giddiness: Secondary | ICD-10-CM | POA: Insufficient documentation

## 2021-01-17 DIAGNOSIS — R55 Syncope and collapse: Secondary | ICD-10-CM | POA: Diagnosis not present

## 2021-01-17 DIAGNOSIS — R739 Hyperglycemia, unspecified: Secondary | ICD-10-CM

## 2021-01-17 DIAGNOSIS — R112 Nausea with vomiting, unspecified: Secondary | ICD-10-CM

## 2021-01-17 DIAGNOSIS — I129 Hypertensive chronic kidney disease with stage 1 through stage 4 chronic kidney disease, or unspecified chronic kidney disease: Secondary | ICD-10-CM | POA: Insufficient documentation

## 2021-01-17 DIAGNOSIS — Z79899 Other long term (current) drug therapy: Secondary | ICD-10-CM | POA: Insufficient documentation

## 2021-01-17 DIAGNOSIS — R0902 Hypoxemia: Secondary | ICD-10-CM | POA: Diagnosis not present

## 2021-01-17 DIAGNOSIS — R000 Tachycardia, unspecified: Secondary | ICD-10-CM | POA: Diagnosis not present

## 2021-01-17 DIAGNOSIS — I959 Hypotension, unspecified: Secondary | ICD-10-CM | POA: Diagnosis not present

## 2021-01-17 LAB — URINALYSIS, ROUTINE W REFLEX MICROSCOPIC
Bacteria, UA: NONE SEEN
Bilirubin Urine: NEGATIVE
Glucose, UA: >=500 mg/dL — AB
Hgb urine dipstick: NEGATIVE
Ketones, ur: NEGATIVE mg/dL
Leukocytes,Ua: NEGATIVE
Nitrite: NEGATIVE
Protein, ur: NEGATIVE mg/dL
Specific Gravity, Urine: 1.014 (ref 1.005–1.030)
pH: 5 (ref 5.0–8.0)

## 2021-01-17 LAB — COMPREHENSIVE METABOLIC PANEL
ALT: 67 U/L — ABNORMAL HIGH (ref 0–44)
AST: 43 U/L — ABNORMAL HIGH (ref 15–41)
Albumin: 3.9 g/dL (ref 3.5–5.0)
Alkaline Phosphatase: 87 U/L (ref 38–126)
Anion gap: 10 (ref 5–15)
BUN: 11 mg/dL (ref 8–23)
CO2: 25 mmol/L (ref 22–32)
Calcium: 9.1 mg/dL (ref 8.9–10.3)
Chloride: 100 mmol/L (ref 98–111)
Creatinine, Ser: 1.61 mg/dL — ABNORMAL HIGH (ref 0.61–1.24)
GFR, Estimated: 48 mL/min — ABNORMAL LOW (ref >60)
Glucose, Bld: 233 mg/dL — ABNORMAL HIGH (ref 70–99)
Potassium: 3.8 mmol/L (ref 3.5–5.1)
Sodium: 135 mmol/L (ref 135–145)
Total Bilirubin: 0.6 mg/dL (ref 0.3–1.2)
Total Protein: 6.9 g/dL (ref 6.5–8.1)

## 2021-01-17 LAB — CBC
HCT: 44.3 % (ref 39.0–52.0)
Hemoglobin: 14.2 g/dL (ref 13.0–17.0)
MCH: 28.9 pg (ref 26.0–34.0)
MCHC: 32.1 g/dL (ref 30.0–36.0)
MCV: 90.2 fL (ref 80.0–100.0)
Platelets: 306 10*3/uL (ref 150–400)
RBC: 4.91 MIL/uL (ref 4.22–5.81)
RDW: 14.9 % (ref 11.5–15.5)
WBC: 20.1 10*3/uL — ABNORMAL HIGH (ref 4.0–10.5)
nRBC: 0 % (ref 0.0–0.2)

## 2021-01-17 MED ORDER — SODIUM CHLORIDE 0.9 % IV BOLUS: 1000.0000 mL | Freq: Once | INTRAVENOUS | Status: DC

## 2021-01-17 MED ORDER — ONDANSETRON HCL 4 MG/2ML IJ SOLN
4.0000 mg | Freq: Once | INTRAMUSCULAR | Status: AC
  Administered 2021-01-17: 4 mg via INTRAVENOUS
  Filled 2021-01-17: qty 2

## 2021-01-17 MED ORDER — SODIUM CHLORIDE 0.9 % IV BOLUS
1000.0000 mL | Freq: Once | INTRAVENOUS | Status: AC
  Administered 2021-01-17: 1000 mL via INTRAVENOUS

## 2021-01-17 NOTE — ED Notes (Signed)
Pt placed on 2L [place]

## 2021-01-17 NOTE — ED Provider Notes (Signed)
[facility] EMERGENCY DEPARTMENT Provider Note   CSN: [id number] Arrival date & time: 01/17/21  1923     History Chief Complaint  Patient presents with   Nausea    Curtis Williamson is a 60 y.o. male.  Pt c/o nausea, vomiting, and then syncopal event tonight. States has made food/dinner, was feeling nauseated most of the day - had eaten/drank relatively little all day, was about to eat, noted increased nausea and episode of emesis (not bloody or bilious), and then became faint, lightheaded, felt clammy, and had brief syncopal event. Had 1-2 loose stools today, no severe diarrhea, no bloody bms. EMS was called - pt was alert, oriented, not confused or postictal. Denies abd pain or distension. No diarrhea. Normal bms. No melena or hematochezia. Denies fever or chills. Denies headache. No change in speech or vision. No numbness/weakness. No problems w gait or balance. No associated chest pain or discomfort. No palpitations or sense of irregular heart beating. No sob or unusual doe. No leg pain or swelling. No dysuria or gu c/o. Denies change in meds or new meds. No fever or chills.   The history is provided by the patient, medical records and the EMS personnel.      Past Medical History:  Diagnosis Date   Chronic kidney disease    only has one kidney   Diabetes mellitus without complication ([place])    Essential hypertension    Hyperlipidemia    Hypertension    Phreesia 01/13/2020    Patient Active Problem List   Diagnosis Date Noted   Low back pain 10/28/2020   Pain due to onychomycosis of toenails of both feet 10/09/2020   Hyperlipidemia 09/22/2020   Thickened nails 09/22/2020   Insomnia 09/22/2020   Acute pain of left shoulder 05/19/2020   Tobacco dependence 03/26/2019   Essential hypertension 03/26/2019   Type 2 diabetes mellitus without complication, without long-term current use of insulin ([place]) 03/26/2019   Aneurysm, cerebral, nonruptured 03/26/2019     Past Surgical History:  Procedure Laterality Date   KIDNEY DONATION     MANDIBLE FRACTURE SURGERY     ROTATOR CUFF REPAIR Right        Family History  Problem Relation Age of Onset   Hypertension Mother    Kidney failure Mother    Kidney disease Mother    Heart disease Mother    ADD / ADHD Daughter    Anxiety disorder Daughter    Alcohol abuse Father    Colon cancer Neg Hx    Stomach cancer Neg Hx    Esophageal cancer Neg Hx    Colon polyps Neg Hx     Social History   Tobacco Use   Smoking status: Every Day    Packs/day: 0.25    Years: 30.00    Pack years: 7.50    Types: Cigarettes   Smokeless tobacco: Never   Tobacco comments:    off and on, has tried to quit  Vaping Use   Vaping Use: Never used  Substance Use Topics   Alcohol use: Yes    Comment: rarely   Drug use: Never    Home Medications Prior to Admission medications   Medication Sig Start Date End Date Taking? Authorizing Provider  Accu-Chek Softclix Lancets lancets Use as instructed 09/22/20   Mayers, Cari S, PA-C  acetaminophen-codeine (TYLENOL #3) 300-30 MG tablet Take 1 tablet by mouth every 12 (twelve) hours as needed for moderate pain. 09/09/20   Meredith Pel,  MD  atorvastatin (LIPITOR) 10 MG tablet Take 1 tablet (10 mg total) by mouth daily. 09/22/20   Mayers, Cari S, PA-C  baclofen (LIORESAL) 10 MG tablet Take 0.5-1 tablets (5-10 mg total) by mouth 3 (three) times daily as needed for muscle spasms. Patient not taking: Reported on 11/06/2020 01/20/20   Hilts, Legrand Como, MD  Blood Glucose Monitoring Suppl (ACCU-CHEK GUIDE ME) w/Device KIT 1 each by Does not apply route 2 (two) times daily. Use to check FSBS twice a day. Dx: E11.9 09/22/20   Mayers, Cari S, PA-C  cefdinir (OMNICEF) 300 MG capsule Take 1 capsule (300 mg total) by mouth 2 (two) times daily. 01/06/21   Jaynee Eagles, PA-C  chlorzoxazone (PARAFON) 500 MG tablet TAKE 1 TABLET BY MOUTH THREE TIMES A DAY AS NEEDED FOR MUSCLE SPASMS 10/21/20    Hilts, Legrand Como, MD  docusate sodium (COLACE) 100 MG capsule Take 1 capsule (100 mg total) by mouth 2 (two) times daily. 07/10/20 07/10/21  Magnant, Charles L, PA-C  empagliflozin (JARDIANCE) 10 MG TABS tablet Take 1 tablet (10 mg total) by mouth daily before breakfast. 12/23/20   Camillia Herter, NP  fluticasone (FLONASE) 50 MCG/ACT nasal spray Place 2 sprays into both nostrils daily. 12/23/20   Camillia Herter, NP  gabapentin (NEURONTIN) 300 MG capsule Take 1 capsule (300 mg total) by mouth 3 (three) times daily. 12/24/20 12/24/21  Magnant, Charles L, PA-C  glucose blood (ACCU-CHEK GUIDE) test strip Use as instructed 09/22/20   Mayers, Cari S, PA-C  hydrOXYzine (ATARAX/VISTARIL) 25 MG tablet Take 1 tablet (25 mg total) by mouth every 6 (six) hours. 11/19/20   Mayers, Cari S, PA-C  lisinopril-hydrochlorothiazide (ZESTORETIC) 10-12.5 MG tablet Take 1 tablet by mouth daily. 12/23/20   Camillia Herter, NP  meloxicam (MOBIC) 15 MG tablet Take 1 tablet (15 mg total) by mouth daily. 12/24/20   Magnant, Charles L, PA-C  metFORMIN (GLUCOPHAGE) 1000 MG tablet Take 1 tablet (1,000 mg total) by mouth 2 (two) times daily with a meal. 12/23/20   Camillia Herter, NP  traZODone (DESYREL) 50 MG tablet TAKE 0.5-1 TABLETS (25-50 MG TOTAL) BY MOUTH AT BEDTIME AS NEEDED FOR SLEEP. 10/20/20   Mayers, Cari S, PA-C  triamcinolone cream (KENALOG) 0.1 % Apply 1 application topically 2 (two) times daily. 07/11/19   Nicolette Bang, MD    Allergies    Mushroom extract complex  Review of Systems   Review of Systems  Constitutional:  Negative for chills and fever.  HENT:  Negative for sore throat.   Eyes:  Negative for redness and visual disturbance.  Respiratory:  Negative for cough and shortness of breath.   Cardiovascular:  Negative for chest pain, palpitations and leg swelling.  Gastrointestinal:  Positive for diarrhea, nausea and vomiting. Negative for abdominal pain and blood in stool.  Genitourinary:  Negative for dysuria and  flank pain.  Musculoskeletal:  Negative for back pain and neck pain.  Skin:  Negative for rash.  Neurological:  Negative for speech difficulty, weakness, numbness and headaches.  Hematological:  Does not bruise/bleed easily.  Psychiatric/Behavioral:  Negative for confusion.    Physical Exam Updated Vital Signs BP 93/71 (BP Location: Right Arm)   Pulse 98   Temp 98.4 F (36.9 C) (Oral)   Resp (!) 22   SpO2 95%   Physical Exam Vitals and nursing note reviewed.  Constitutional:      Appearance: Normal appearance. He is well-developed.  HENT:     Head: Atraumatic.  Nose: Nose normal.     Mouth/Throat:     Mouth: Mucous membranes are moist.     Pharynx: Oropharynx is clear.  Eyes:     General: No scleral icterus.    Extraocular Movements: Extraocular movements intact.     Conjunctiva/sclera: Conjunctivae normal.     Pupils: Pupils are equal, round, and reactive to light.  Neck:     Vascular: No carotid bruit.     Trachea: No tracheal deviation.  Cardiovascular:     Rate and Rhythm: Normal rate and regular rhythm.     Pulses: Normal pulses.     Heart sounds: Normal heart sounds. No murmur heard.   No friction rub. No gallop.  Pulmonary:     Effort: Pulmonary effort is normal. No accessory muscle usage or respiratory distress.     Breath sounds: Normal breath sounds.  Chest:     Chest wall: No tenderness.  Abdominal:     General: Bowel sounds are normal. There is no distension.     Palpations: Abdomen is soft. There is no mass.     Tenderness: There is no abdominal tenderness. There is no guarding or rebound.     Hernia: No hernia is present.  Genitourinary:    Comments: No cva tenderness. Musculoskeletal:        General: No swelling or tenderness.     Cervical back: Normal range of motion and neck supple. No rigidity or tenderness.     Right lower leg: No edema.     Left lower leg: No edema.  Skin:    General: Skin is warm and dry.     Findings: No rash.   Neurological:     Mental Status: He is alert.     Comments: Alert, speech clear. No dysarthria or aphasia. Motor/sens grossly intact bil. Steady gait.   Psychiatric:        Mood and Affect: Mood normal.    ED Results / Procedures / Treatments   Labs (all labs ordered are listed, but only abnormal results are displayed) Results for orders placed or performed during the hospital encounter of 01/17/21  CBC  Result Value Ref Range   WBC 20.1 (H) 4.0 - 10.5 K/uL   RBC 4.91 4.22 - 5.81 MIL/uL   Hemoglobin 14.2 13.0 - 17.0 g/dL   HCT 44.3 39.0 - 52.0 %   MCV 90.2 80.0 - 100.0 fL   MCH 28.9 26.0 - 34.0 pg   MCHC 32.1 30.0 - 36.0 g/dL   RDW 14.9 11.5 - 15.5 %   Platelets 306 150 - 400 K/uL   nRBC 0.0 0.0 - 0.2 %  Comprehensive metabolic panel  Result Value Ref Range   Sodium 135 135 - 145 mmol/L   Potassium 3.8 3.5 - 5.1 mmol/L   Chloride 100 98 - 111 mmol/L   CO2 25 22 - 32 mmol/L   Glucose, Bld 233 (H) 70 - 99 mg/dL   BUN 11 8 - 23 mg/dL   Creatinine, Ser 1.61 (H) 0.61 - 1.24 mg/dL   Calcium 9.1 8.9 - 10.3 mg/dL   Total Protein 6.9 6.5 - 8.1 g/dL   Albumin 3.9 3.5 - 5.0 g/dL   AST 43 (H) 15 - 41 U/L   ALT 67 (H) 0 - 44 U/L   Alkaline Phosphatase 87 38 - 126 U/L   Total Bilirubin 0.6 0.3 - 1.2 mg/dL   GFR, Estimated 48 (L) >60 mL/min   Anion gap 10 5 - 15  ED ECG REPORT   Date: 01/17/2021  Rate: 87  Rhythm: normal sinus rhythm  QRS Axis: right  Intervals: normal  ST/T Wave abnormalities: normal  Conduction Disutrbances:none  Narrative Interpretation:   Old EKG Reviewed: none available  I have personally reviewed the EKG tracing    Radiology No results found.  Procedures Procedures   Medications Ordered in ED Medications  sodium chloride 0.9 % bolus 1,000 mL (has no administration in time range)  ondansetron (ZOFRAN) injection 4 mg (has no administration in time range)    ED Course  I have reviewed the triage vital signs and the nursing  notes.  Pertinent labs & imaging results that were available during my care of the patient were reviewed by me and considered in my medical decision making (see chart for details).    MDM Rules/Calculators/A&P                          Iv ns. Continuous pulse ox and cardiac monitoring. Stat labs. Ecg.   Reviewed nursing notes and prior charts for additional history.   Labs reviewed/interpreted by me - hgb/hct normal. Glucose mildly elev, hco3 and ag normal.  Cr mildly increased from prior, recent nvd, ivns bolus.   Ns bolus.   Recheck, feels improved. No faintness or dizziness. No abd pain. Nausea improved, pt requests po.   Po fluids. Food. Ambulate in hall.  Pt continues to deny any symptoms, no pain, no fever/chills, no nv.  Pt currently appears stable for d/c.    Final Clinical Impression(s) / ED Diagnoses Final diagnoses:  None    Rx / DC Orders ED Discharge Orders     None        Lajean Saver, MD 01/17/21 2308

## 2021-01-17 NOTE — ED Triage Notes (Signed)
Pt presents today from home after he states he felt nauseous all day. PT states he remembers cooking and then sitting down. Per family pt then had "seizure like activity" and 1 episode of vomiting. PT states about 3 weeks ago the same thing happened but he remembers those events. Today's events he can not recall

## 2021-01-17 NOTE — Discharge Instructions (Addendum)
It was our pleasure to provide your ER care today - we hope that you feel better.  Drink plenty of fluids/stay well hydrated.   Follow up with primary care doctor in the next 2-3 days.  Return to ER if worse, new symptoms, fevers, persistent vomiting, new or severe pain, abdominal pain, chest pain, trouble breathing, weak/fainting, or other concern.

## 2021-01-19 ENCOUNTER — Telehealth: Payer: Self-pay

## 2021-01-19 NOTE — Telephone Encounter (Signed)
Transition Care Management Unsuccessful Follow-up Telephone Call  Date of discharge and from where:  01/18/2021-[facility]  Attempts:  1st Attempt  Reason for unsuccessful TCM follow-up call:  Left voice message    

## 2021-01-20 ENCOUNTER — Other Ambulatory Visit: Payer: Self-pay

## 2021-01-20 ENCOUNTER — Encounter (HOSPITAL_COMMUNITY): Payer: Self-pay | Admitting: *Deleted

## 2021-01-20 ENCOUNTER — Encounter: Payer: Self-pay | Admitting: Family Medicine

## 2021-01-20 ENCOUNTER — Ambulatory Visit (INDEPENDENT_AMBULATORY_CARE_PROVIDER_SITE_OTHER): Payer: Medicaid Other | Admitting: Family Medicine

## 2021-01-20 ENCOUNTER — Telehealth: Payer: Self-pay

## 2021-01-20 ENCOUNTER — Emergency Department (HOSPITAL_COMMUNITY): Payer: Medicaid Other

## 2021-01-20 ENCOUNTER — Emergency Department (HOSPITAL_COMMUNITY)
Admission: EM | Admit: 2021-01-20 | Discharge: 2021-01-20 | Disposition: A | Discharge status: Home / Self Care | Payer: Medicaid Other | Attending: Student | Admitting: Student

## 2021-01-20 VITALS — Ht 70.0 in | Wt 201.8 lb

## 2021-01-20 DIAGNOSIS — N189 Chronic kidney disease, unspecified: Secondary | ICD-10-CM | POA: Diagnosis not present

## 2021-01-20 DIAGNOSIS — R197 Diarrhea, unspecified: Secondary | ICD-10-CM | POA: Diagnosis not present

## 2021-01-20 DIAGNOSIS — R1030 Lower abdominal pain, unspecified: Secondary | ICD-10-CM | POA: Diagnosis not present

## 2021-01-20 DIAGNOSIS — R1084 Generalized abdominal pain: Secondary | ICD-10-CM

## 2021-01-20 DIAGNOSIS — R112 Nausea with vomiting, unspecified: Secondary | ICD-10-CM | POA: Insufficient documentation

## 2021-01-20 DIAGNOSIS — R1031 Right lower quadrant pain: Secondary | ICD-10-CM | POA: Diagnosis not present

## 2021-01-20 DIAGNOSIS — E1122 Type 2 diabetes mellitus with diabetic chronic kidney disease: Secondary | ICD-10-CM | POA: Diagnosis not present

## 2021-01-20 DIAGNOSIS — I129 Hypertensive chronic kidney disease with stage 1 through stage 4 chronic kidney disease, or unspecified chronic kidney disease: Secondary | ICD-10-CM | POA: Insufficient documentation

## 2021-01-20 DIAGNOSIS — Z7984 Long term (current) use of oral hypoglycemic drugs: Secondary | ICD-10-CM | POA: Diagnosis not present

## 2021-01-20 DIAGNOSIS — U071 COVID-19: Secondary | ICD-10-CM | POA: Insufficient documentation

## 2021-01-20 DIAGNOSIS — R55 Syncope and collapse: Secondary | ICD-10-CM | POA: Diagnosis not present

## 2021-01-20 DIAGNOSIS — R0902 Hypoxemia: Secondary | ICD-10-CM

## 2021-01-20 DIAGNOSIS — K760 Fatty (change of) liver, not elsewhere classified: Secondary | ICD-10-CM | POA: Diagnosis not present

## 2021-01-20 DIAGNOSIS — F17210 Nicotine dependence, cigarettes, uncomplicated: Secondary | ICD-10-CM | POA: Insufficient documentation

## 2021-01-20 DIAGNOSIS — R509 Fever, unspecified: Secondary | ICD-10-CM

## 2021-01-20 DIAGNOSIS — Z79899 Other long term (current) drug therapy: Secondary | ICD-10-CM | POA: Insufficient documentation

## 2021-01-20 LAB — COMPREHENSIVE METABOLIC PANEL
ALT: 88 U/L — ABNORMAL HIGH (ref 0–44)
AST: 74 U/L — ABNORMAL HIGH (ref 15–41)
Albumin: 3.8 g/dL (ref 3.5–5.0)
Alkaline Phosphatase: 81 U/L (ref 38–126)
Anion gap: 9 (ref 5–15)
BUN: 8 mg/dL (ref 8–23)
CO2: 24 mmol/L (ref 22–32)
Calcium: 8.8 mg/dL — ABNORMAL LOW (ref 8.9–10.3)
Chloride: 102 mmol/L (ref 98–111)
Creatinine, Ser: 1.28 mg/dL — ABNORMAL HIGH (ref 0.61–1.24)
GFR, Estimated: >60 mL/min (ref >60)
Glucose, Bld: 210 mg/dL — ABNORMAL HIGH (ref 70–99)
Potassium: 3.7 mmol/L (ref 3.5–5.1)
Sodium: 135 mmol/L (ref 135–145)
Total Bilirubin: 0.5 mg/dL (ref 0.3–1.2)
Total Protein: 6.8 g/dL (ref 6.5–8.1)

## 2021-01-20 LAB — CBC WITH DIFFERENTIAL/PLATELET
Abs Immature Granulocytes: 0.08 10*3/uL — ABNORMAL HIGH (ref 0.00–0.07)
Basophils Absolute: 0.1 10*3/uL (ref 0.0–0.1)
Basophils Relative: 1 %
Eosinophils Absolute: 0.2 10*3/uL (ref 0.0–0.5)
Eosinophils Relative: 2 %
HCT: 41.7 % (ref 39.0–52.0)
Hemoglobin: 13.3 g/dL (ref 13.0–17.0)
Immature Granulocytes: 1 %
Lymphocytes Relative: 4 %
Lymphs Abs: 0.4 10*3/uL — ABNORMAL LOW (ref 0.7–4.0)
MCH: 28.4 pg (ref 26.0–34.0)
MCHC: 31.9 g/dL (ref 30.0–36.0)
MCV: 88.9 fL (ref 80.0–100.0)
Monocytes Absolute: 1.3 10*3/uL — ABNORMAL HIGH (ref 0.1–1.0)
Monocytes Relative: 13 %
Neutro Abs: 7.7 10*3/uL (ref 1.7–7.7)
Neutrophils Relative %: 79 %
Platelets: 273 10*3/uL (ref 150–400)
RBC: 4.69 MIL/uL (ref 4.22–5.81)
RDW: 14.8 % (ref 11.5–15.5)
WBC: 9.7 10*3/uL (ref 4.0–10.5)
nRBC: 0 % (ref 0.0–0.2)

## 2021-01-20 LAB — URINALYSIS, ROUTINE W REFLEX MICROSCOPIC
Bilirubin Urine: NEGATIVE
Glucose, UA: 50 mg/dL — AB
Hgb urine dipstick: NEGATIVE
Ketones, ur: 5 mg/dL — AB
Leukocytes,Ua: NEGATIVE
Nitrite: NEGATIVE
Protein, ur: NEGATIVE mg/dL
Specific Gravity, Urine: 1.023 (ref 1.005–1.030)
pH: 5 (ref 5.0–8.0)

## 2021-01-20 LAB — LIPASE, BLOOD: Lipase: 19 U/L (ref 11–51)

## 2021-01-20 LAB — RESP PANEL BY RT-PCR (FLU A&B, COVID) ARPGX2
Influenza A by PCR: NEGATIVE
Influenza B by PCR: NEGATIVE
SARS Coronavirus 2 by RT PCR: POSITIVE — AB

## 2021-01-20 IMAGING — CT CT ABD-PELV W/ CM
2 of 5 series · 17 of 46 positions shown, 19 images · IV contrast (APPLIED)
Comparison: [DATE]

CLINICAL DATA: Right lower quadrant pain.  Suspected appendicitis.

EXAM:
CT ABDOMEN AND PELVIS WITH CONTRAST
TECHNIQUE: Multidetector CT imaging of the abdomen and pelvis was performed
using the standard protocol following bolus administration of
intravenous contrast.
CONTRAST:  80mL OMNIPAQUE IOHEXOL 350 MG/ML SOLN

[Series 3: abd/ pelvis 5.0 i30f 2 · axial · 0.82mm/px · z∈[+900,+1304]mm · 14 of 91 slices shown, 16 images]
[im 5/91  soft-tissue]
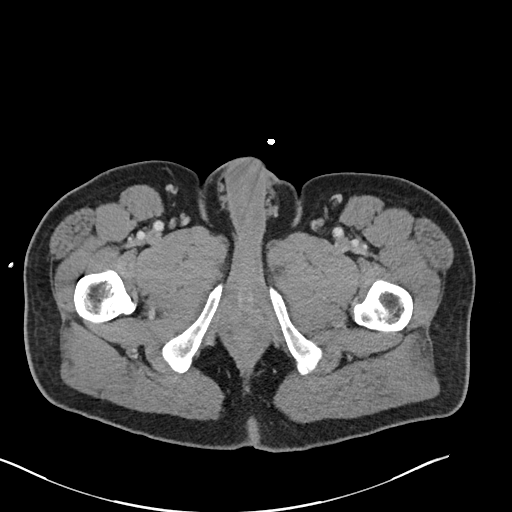
[im 5/91  bone]
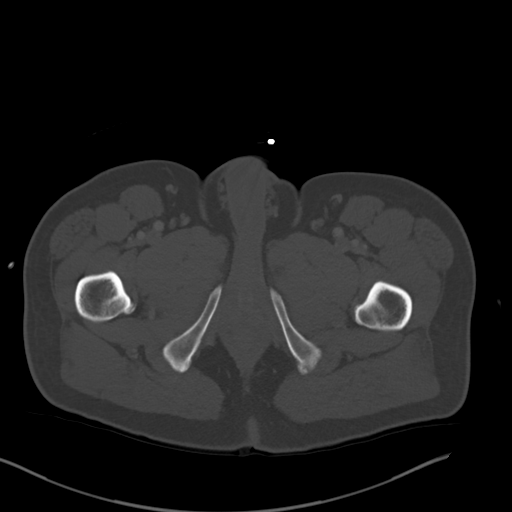
[im 13/91  soft-tissue]
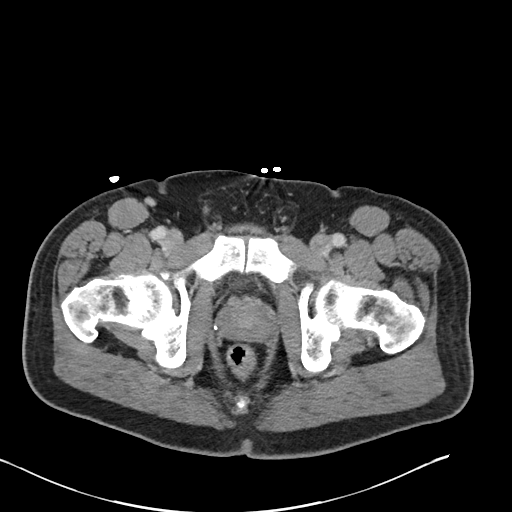
[im 18/91  soft-tissue]
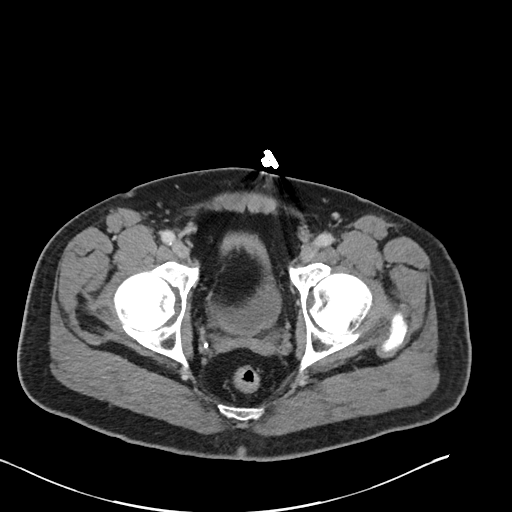
[im 26/91  soft-tissue]
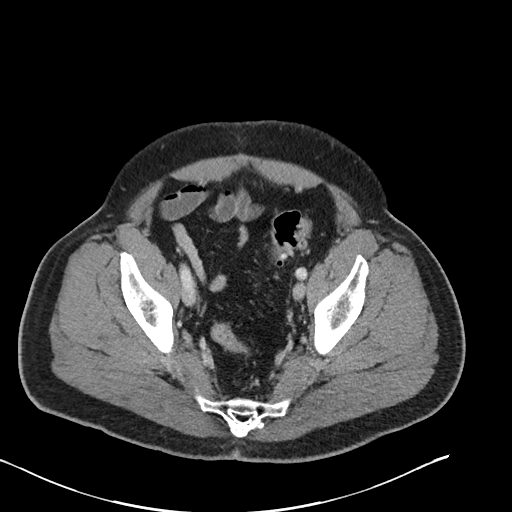
[im 31/91  soft-tissue]
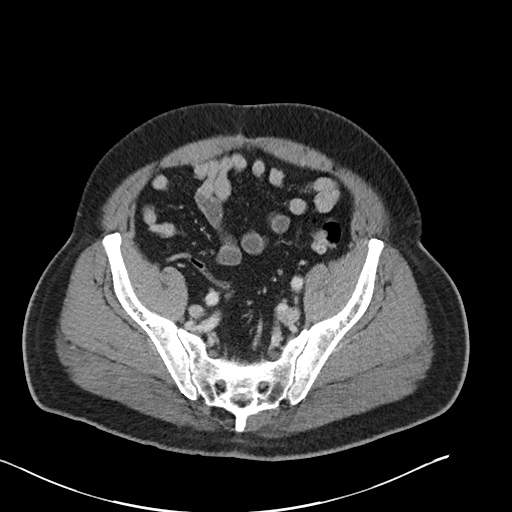
[im 35/91  soft-tissue]
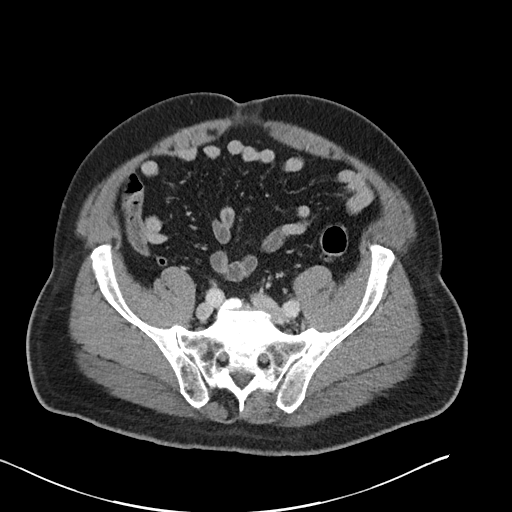
[im 43/91  soft-tissue]
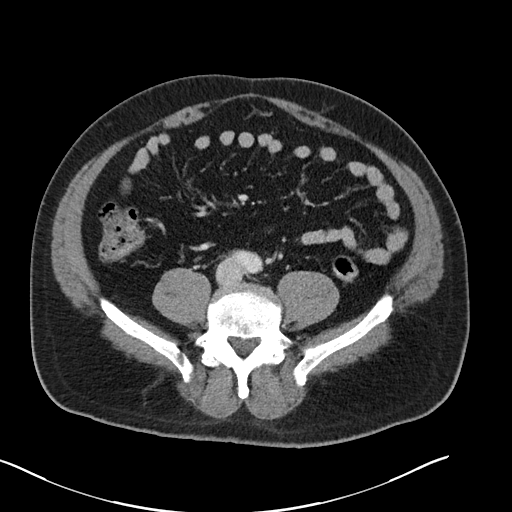
[im 48/91  soft-tissue]
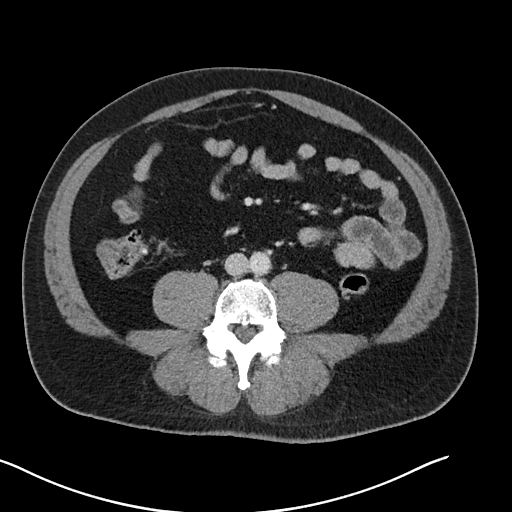
[im 56/91  soft-tissue]
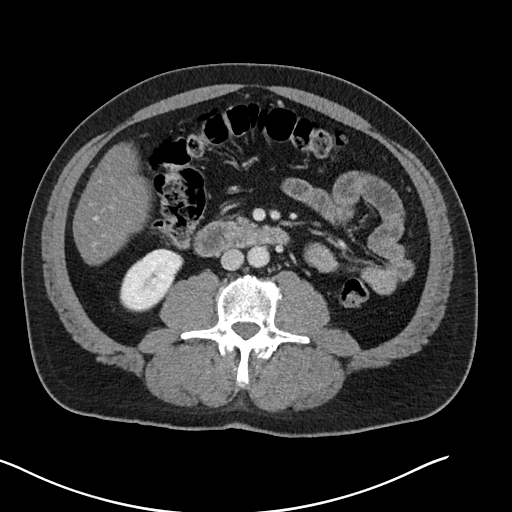
[im 56/91  bone]
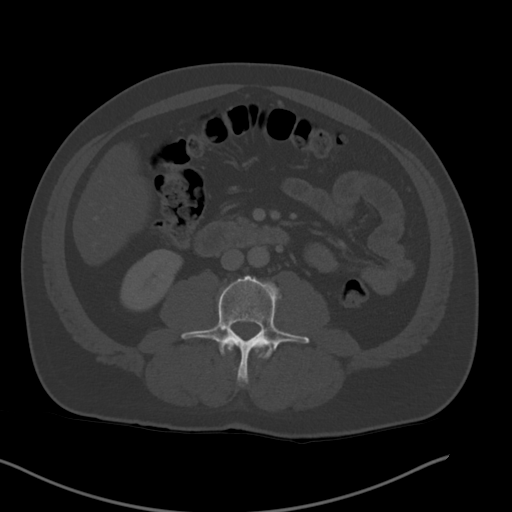
[im 61/91  soft-tissue]
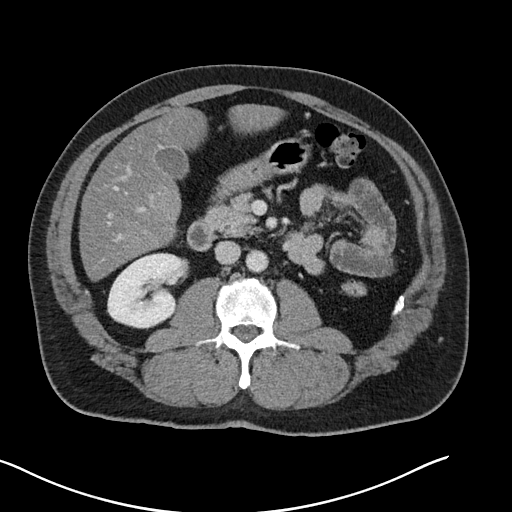
[im 69/91  soft-tissue]
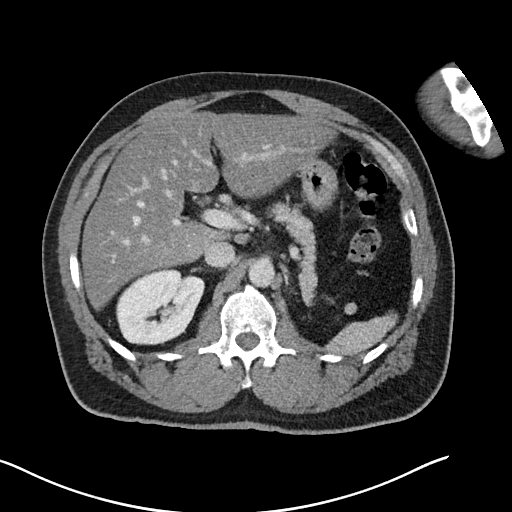
[im 73/91  soft-tissue]
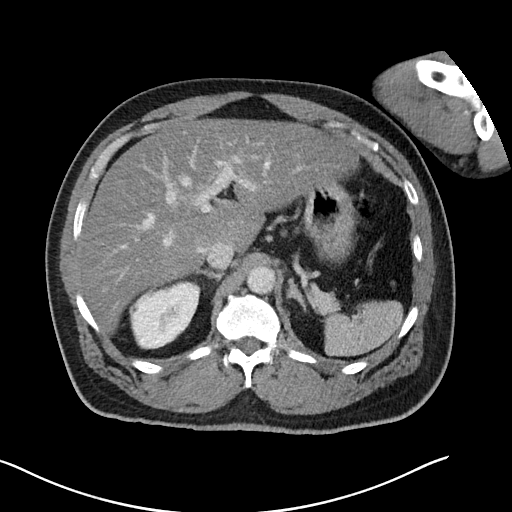
[im 78/91  soft-tissue]
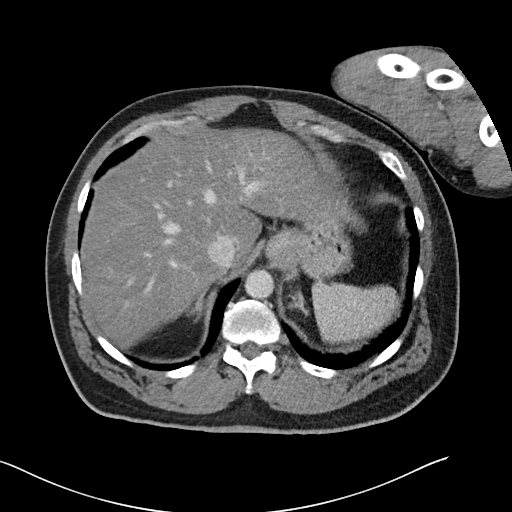
[im 86/91  soft-tissue]
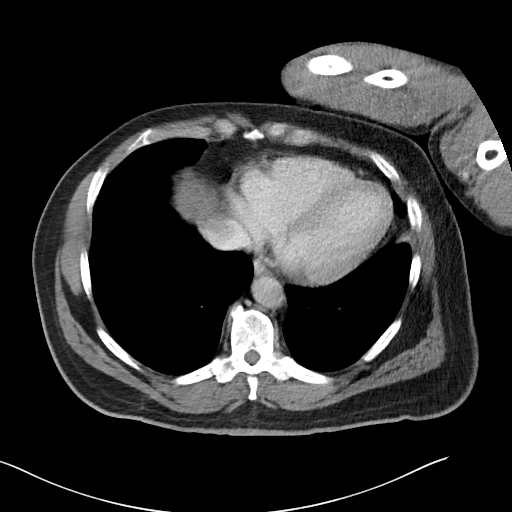

[Series 5: coronal soft tissue · coronal · 0.89mm/px · 3 of 124 slices shown]
[im 42/124  soft-tissue]
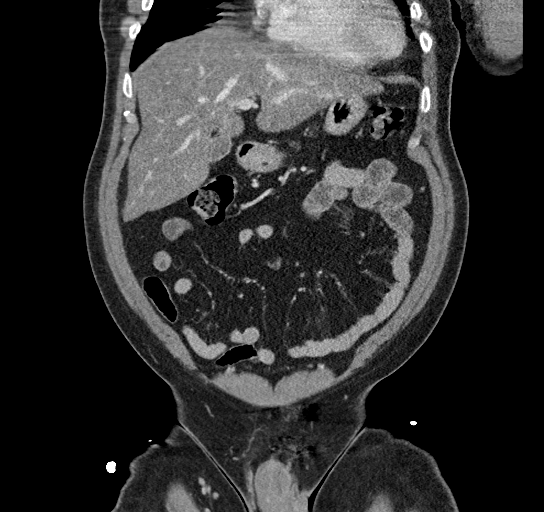
[im 55/124  soft-tissue]
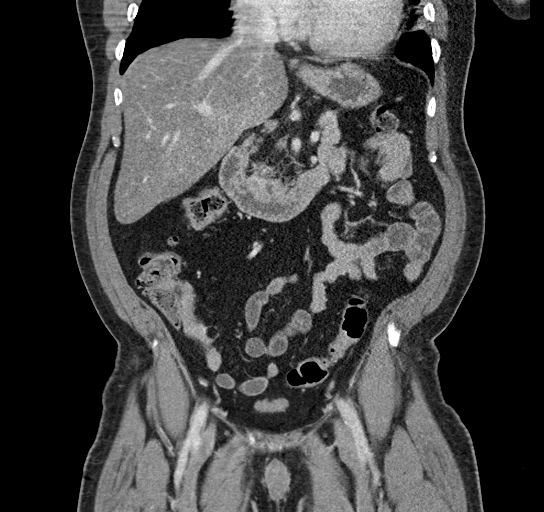
[im 69/124  soft-tissue]
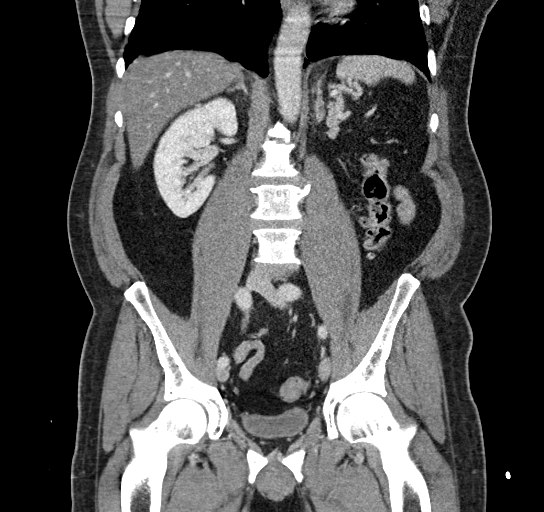

[17 of 46 positions shown; findings below may reference images not displayed]

FINDINGS: Lower Chest: No acute findings.

Hepatobiliary: No hepatic masses identified. Moderate diffuse
hepatic steatosis is again demonstrated. Gallbladder is
unremarkable. No evidence of biliary ductal dilatation.

Pancreas:  No mass or inflammatory changes.

Spleen: Within normal limits in size and appearance.

Adrenals/Urinary Tract: Normal adrenal glands. Previous left
nephrectomy. A few tiny sub-cm right renal cysts are noted. No
masses identified. No evidence of ureteral calculi or
hydronephrosis.

Stomach/Bowel: No evidence of obstruction, inflammatory process or
abnormal fluid collections. Normal appendix visualized.
Diverticulosis is again seen involving the ascending and sigmoid
portions of the colon, however there is no evidence of
diverticulitis.

Vascular/Lymphatic: No pathologically enlarged lymph nodes. No acute
vascular findings.

Reproductive:  No mass or other significant abnormality.

Other:  None.

Musculoskeletal:  No suspicious bone lesions identified.
IMPRESSION: No evidence of appendicitis or other acute findings.

Colonic diverticulosis, without radiographic evidence of
diverticulitis.

Stable moderate hepatic steatosis.

## 2021-01-20 MED ORDER — PROMETHAZINE-DM 6.25-15 MG/5ML PO SYRP: 5.0000 mL | ORAL_SOLUTION | Freq: Four times a day (QID) | ORAL | 0 refills | Status: DC | PRN

## 2021-01-20 MED ORDER — ONDANSETRON 4 MG PO TBDP: 4.0000 mg | ORAL_TABLET | Freq: Three times a day (TID) | ORAL | 0 refills | Status: DC | PRN

## 2021-01-20 MED ORDER — ACETAMINOPHEN 500 MG PO TABS
1000.0000 mg | ORAL_TABLET | Freq: Once | ORAL | Status: AC
  Administered 2021-01-20: 1000 mg via ORAL
  Filled 2021-01-20: qty 2

## 2021-01-20 MED ORDER — ACETAMINOPHEN 500 MG PO TABS: 500.0000 mg | ORAL_TABLET | Freq: Four times a day (QID) | ORAL | 0 refills | Status: DC | PRN

## 2021-01-20 MED ORDER — ONDANSETRON 4 MG PO TBDP
4.0000 mg | ORAL_TABLET | Freq: Once | ORAL | Status: AC
  Administered 2021-01-20: 4 mg via ORAL
  Filled 2021-01-20: qty 1

## 2021-01-20 MED ORDER — BENZONATATE 100 MG PO CAPS: 100.0000 mg | ORAL_CAPSULE | Freq: Three times a day (TID) | ORAL | 0 refills | Status: DC

## 2021-01-20 MED ORDER — IOHEXOL 350 MG/ML SOLN
80.0000 mL | Freq: Once | INTRAVENOUS | Status: AC | PRN
  Administered 2021-01-20: 80 mL via INTRAVENOUS

## 2021-01-20 NOTE — ED Provider Notes (Signed)
[facility] EMERGENCY DEPARTMENT Provider Note   CSN: [id number] Arrival date & time: 01/20/21  0932     History Chief Complaint  Patient presents with   Abdominal Pain   Near Syncope    Curtis Williamson is a 60 y.o. male.  HPI Patient is a 60 year old male with past medical history significant for CKD with 1 kidney, DM2, HTN, HLD, HTN  Patient is presented to ER today with symptoms of abdominal pain nausea, has had some mild diarrhea as well.  Feels fatigued.  No chest pain or shortness of breath.  States he has had intermittent fevers has not been taking medications for this.  States his abdomen is painful as well he states is worse in the right lower quadrant than a rales.  He states mild cough no rhinorrhea or congestion.  No other associated symptoms.       Past Medical History:  Diagnosis Date   Chronic kidney disease    only has one kidney   Diabetes mellitus without complication ([place])    Essential hypertension    Hyperlipidemia    Hypertension    Phreesia 01/13/2020    Patient Active Problem List   Diagnosis Date Noted   Low back pain 10/28/2020   Pain due to onychomycosis of toenails of both feet 10/09/2020   Hyperlipidemia 09/22/2020   Thickened nails 09/22/2020   Insomnia 09/22/2020   Acute pain of left shoulder 05/19/2020   Tobacco dependence 03/26/2019   Essential hypertension 03/26/2019   Type 2 diabetes mellitus without complication, without long-term current use of insulin ([place]) 03/26/2019   Aneurysm, cerebral, nonruptured 03/26/2019    Past Surgical History:  Procedure Laterality Date   KIDNEY DONATION     MANDIBLE FRACTURE SURGERY     ROTATOR CUFF REPAIR Right        Family History  Problem Relation Age of Onset   Hypertension Mother    Kidney failure Mother    Kidney disease Mother    Heart disease Mother    ADD / ADHD Daughter    Anxiety disorder Daughter    Alcohol abuse Father    Colon cancer Neg Hx     Stomach cancer Neg Hx    Esophageal cancer Neg Hx    Colon polyps Neg Hx     Social History   Tobacco Use   Smoking status: Every Day    Packs/day: 0.25    Years: 30.00    Pack years: 7.50    Types: Cigarettes   Smokeless tobacco: Never   Tobacco comments:    off and on, has tried to quit  Vaping Use   Vaping Use: Never used  Substance Use Topics   Alcohol use: Yes    Comment: rarely   Drug use: Never    Home Medications Prior to Admission medications   Medication Sig Start Date End Date Taking? Authorizing Provider  acetaminophen (TYLENOL) 500 MG tablet Take 1 tablet (500 mg total) by mouth every 6 (six) hours as needed for fever or moderate pain. 01/20/21  Yes Torez Beauregard S, PA  benzonatate (TESSALON) 100 MG capsule Take 1 capsule (100 mg total) by mouth every 8 (eight) hours. 01/20/21  Yes Keymora Grillot S, PA  ondansetron (ZOFRAN ODT) 4 MG disintegrating tablet Take 1 tablet (4 mg total) by mouth every 8 (eight) hours as needed for nausea or vomiting. 01/20/21  Yes Gideon Burstein S, PA  promethazine-dextromethorphan (PROMETHAZINE-DM) 6.25-15 MG/5ML syrup Take 5 mLs by mouth 4 (  four) times daily as needed for cough. 01/20/21  Yes Pati Gallo S, PA  Accu-Chek Softclix Lancets lancets Use as instructed 09/22/20   Mayers, Cari S, PA-C  acetaminophen-codeine (TYLENOL #3) 300-30 MG tablet Take 1 tablet by mouth every 12 (twelve) hours as needed for moderate pain. 09/09/20   Meredith Pel, MD  atorvastatin (LIPITOR) 10 MG tablet TAKE 1 TABLET (10 MG TOTAL) BY MOUTH DAILY. 01/18/21   Dorna Mai, MD  baclofen (LIORESAL) 10 MG tablet Take 0.5-1 tablets (5-10 mg total) by mouth 3 (three) times daily as needed for muscle spasms. 01/20/20   Hilts, Legrand Como, MD  Blood Glucose Monitoring Suppl (ACCU-CHEK GUIDE ME) w/Device KIT 1 each by Does not apply route 2 (two) times daily. Use to check FSBS twice a day. Dx: E11.9 09/22/20   Mayers, Cari S, PA-C  cefdinir (OMNICEF) 300 MG capsule  Take 1 capsule (300 mg total) by mouth 2 (two) times daily. 01/06/21   Jaynee Eagles, PA-C  chlorzoxazone (PARAFON) 500 MG tablet TAKE 1 TABLET BY MOUTH THREE TIMES A DAY AS NEEDED FOR MUSCLE SPASMS Patient taking differently: Take 500 mg by mouth 3 (three) times daily as needed for muscle spasms. 10/21/20   Hilts, Legrand Como, MD  docusate sodium (COLACE) 100 MG capsule Take 1 capsule (100 mg total) by mouth 2 (two) times daily. 07/10/20 07/10/21  Magnant, Gerrianne Scale, PA-C  empagliflozin (JARDIANCE) 10 MG TABS tablet Take 1 tablet (10 mg total) by mouth daily before breakfast. 12/23/20   Camillia Herter, NP  fluticasone (FLONASE) 50 MCG/ACT nasal spray SPRAY 2 SPRAYS INTO EACH NOSTRIL EVERY DAY Patient taking differently: Place 2 sprays into both nostrils daily. 01/18/21   Dorna Mai, MD  gabapentin (NEURONTIN) 300 MG capsule Take 1 capsule (300 mg total) by mouth 3 (three) times daily. 12/24/20 12/24/21  Magnant, Charles L, PA-C  glucose blood (ACCU-CHEK GUIDE) test strip Use as instructed 09/22/20   Mayers, Cari S, PA-C  hydrOXYzine (ATARAX/VISTARIL) 25 MG tablet Take 1 tablet (25 mg total) by mouth every 6 (six) hours. 11/19/20   Mayers, Cari S, PA-C  lisinopril-hydrochlorothiazide (ZESTORETIC) 10-12.5 MG tablet Take 1 tablet by mouth daily. 12/23/20   Camillia Herter, NP  meloxicam (MOBIC) 15 MG tablet TAKE 1 TABLET (15 MG TOTAL) BY MOUTH DAILY. 01/21/21   Magnant, Charles L, PA-C  metFORMIN (GLUCOPHAGE) 1000 MG tablet Take 1 tablet (1,000 mg total) by mouth 2 (two) times daily with a meal. 12/23/20   Camillia Herter, NP  traZODone (DESYREL) 50 MG tablet TAKE 0.5-1 TABLETS (25-50 MG TOTAL) BY MOUTH AT BEDTIME AS NEEDED FOR SLEEP. 10/20/20   Mayers, Cari S, PA-C  triamcinolone cream (KENALOG) 0.1 % Apply 1 application topically 2 (two) times daily. 07/11/19   Nicolette Bang, MD    Allergies    Mushroom extract complex  Review of Systems   Review of Systems  Constitutional:  Positive for fatigue. Negative  for chills and fever.  HENT:  Negative for congestion.   Eyes:  Negative for pain.  Respiratory:  Negative for cough and shortness of breath.   Cardiovascular:  Negative for chest pain and leg swelling.  Gastrointestinal:  Positive for abdominal pain, diarrhea, nausea and vomiting.  Genitourinary:  Negative for dysuria.  Musculoskeletal:  Negative for myalgias.  Skin:  Negative for rash.  Neurological:  Positive for light-headedness. Negative for dizziness and headaches.   Physical Exam Updated Vital Signs BP 138/85   Pulse 98   Temp 100  F (37.8 C) (Oral)   Resp 20   SpO2 98%   Physical Exam Vitals and nursing note reviewed.  Constitutional:      General: He is not in acute distress.    Comments: Somewhat fatigued appearing 60 year old male  HENT:     Head: Normocephalic and atraumatic.     Nose: Nose normal.     Mouth/Throat:     Mouth: Mucous membranes are moist.  Eyes:     General: No scleral icterus. Cardiovascular:     Rate and Rhythm: Normal rate and regular rhythm.     Pulses: Normal pulses.     Heart sounds: Normal heart sounds.  Pulmonary:     Effort: Pulmonary effort is normal. No respiratory distress.     Breath sounds: No wheezing.  Abdominal:     Palpations: Abdomen is soft.     Tenderness: There is abdominal tenderness.     Comments: Soft abdomen, diffuse abdominal tenderness.  No guarding or rebound.  More tender in the right lower quadrant  Musculoskeletal:     Cervical back: Normal range of motion.     Right lower leg: No edema.     Left lower leg: No edema.  Skin:    General: Skin is warm and dry.     Capillary Refill: Capillary refill takes less than 2 seconds.  Neurological:     Mental Status: He is alert. Mental status is at baseline.  Psychiatric:        Mood and Affect: Mood normal.        Behavior: Behavior normal.    ED Results / Procedures / Treatments   Labs (all labs ordered are listed, but only abnormal results are  displayed) Labs Reviewed  RESP PANEL BY RT-PCR (FLU A&B, COVID) ARPGX2 - Abnormal; Notable for the following components:      Result Value   SARS Coronavirus 2 by RT PCR POSITIVE (*)    All other components within normal limits  CBC WITH DIFFERENTIAL/PLATELET - Abnormal; Notable for the following components:   Lymphs Abs 0.4 (*)    Monocytes Absolute 1.3 (*)    Abs Immature Granulocytes 0.08 (*)    All other components within normal limits  COMPREHENSIVE METABOLIC PANEL - Abnormal; Notable for the following components:   Glucose, Bld 210 (*)    Creatinine, Ser 1.28 (*)    Calcium 8.8 (*)    AST 74 (*)    ALT 88 (*)    All other components within normal limits  URINALYSIS, ROUTINE W REFLEX MICROSCOPIC - Abnormal; Notable for the following components:   Glucose, UA 50 (*)    Ketones, ur 5 (*)    All other components within normal limits  LIPASE, BLOOD    EKG None  Radiology CT ABDOMEN PELVIS W CONTRAST  Result Date: 01/20/2021 CLINICAL DATA:  Right lower quadrant pain.  Suspected appendicitis. EXAM: CT ABDOMEN AND PELVIS WITH CONTRAST TECHNIQUE: Multidetector CT imaging of the abdomen and pelvis was performed using the standard protocol following bolus administration of intravenous contrast. CONTRAST:  5m OMNIPAQUE IOHEXOL 350 MG/ML SOLN COMPARISON:  03/29/2020 FINDINGS: Lower Chest: No acute findings. Hepatobiliary: No hepatic masses identified. Moderate diffuse hepatic steatosis is again demonstrated. Gallbladder is unremarkable. No evidence of biliary ductal dilatation. Pancreas:  No mass or inflammatory changes. Spleen: Within normal limits in size and appearance. Adrenals/Urinary Tract: Normal adrenal glands. Previous left nephrectomy. A few tiny sub-cm right renal cysts are noted. No masses identified. No evidence of  ureteral calculi or hydronephrosis. Stomach/Bowel: No evidence of obstruction, inflammatory process or abnormal fluid collections. Normal appendix visualized.  Diverticulosis is again seen involving the ascending and sigmoid portions of the colon, however there is no evidence of diverticulitis. Vascular/Lymphatic: No pathologically enlarged lymph nodes. No acute vascular findings. Reproductive:  No mass or other significant abnormality. Other:  None. Musculoskeletal:  No suspicious bone lesions identified. IMPRESSION: No evidence of appendicitis or other acute findings. Colonic diverticulosis, without radiographic evidence of diverticulitis. Stable moderate hepatic steatosis. Electronically Signed   By: Marlaine Hind M.D.   On: 01/20/2021 12:28    Procedures Procedures   Medications Ordered in ED Medications  ondansetron (ZOFRAN-ODT) disintegrating tablet 4 mg (4 mg Oral Given 01/20/21 1015)  iohexol (OMNIPAQUE) 350 MG/ML injection 80 mL (80 mLs Intravenous Contrast Given 01/20/21 1133)  acetaminophen (TYLENOL) tablet 1,000 mg (1,000 mg Oral Given 01/20/21 1631)  ondansetron (ZOFRAN-ODT) disintegrating tablet 4 mg (4 mg Oral Given 01/20/21 1632)    ED Course  I have reviewed the triage vital signs and the nursing notes.  Pertinent labs & imaging results that were available during my care of the patient were reviewed by me and considered in my medical decision making (see chart for details).  Clinical Course as of 01/22/21 0028  Wed Jan 20, 2021  1657 Patient ambulated without difficulty blood pressures have been within normal limits.  Tolerated p.o. after Zofran.  Will discharge home at this time. [WF]  Thu Jan 21, 2021  2053 SARS Coronavirus 2 by RT PCR(!): POSITIVE [WF]    Clinical Course User Index [WF] Tedd Sias, [place]   MDM Rules/Calculators/A&P                          Patient is a COVID-positive 60 year old male found to be COVID-positive today.  Has been having nausea vomiting diarrhea lightheadedness feels somewhat dehydrated  Did have abdominal tenderness on examination and obtain CT abdomen pelvis in triage.  This is negative for  appendicitis.  Patient has CBC without abnormalities CMP with creatinine approximately patient's baseline actually improved from 5 days ago.  Lipase within normal limits doubt pancreatitis urinalysis unremarkable for systemic ketones likely from some dehydration.  Blood sugar somewhat elevated today.  Not significantly so.  Offered IV fluids he would prefer ODT Zofran Tylenol and discharged home.  I think this is reasonable.  Given return precautions.  Repeat abdominal exam is relatively benign.  Does not have any focal tenderness continues to be somewhat tender diffusely.  Given normal CT scan, improvement in symptoms ambulatory without difficulty and tolerating p.o. will discharge home with recommendations to use Zofran, cough medications as prescribed, Tylenol and drink plenty of water.  Bland food recommended.  All questions answered best my ability.  Curtis Williamson was evaluated in Emergency Department on 01/22/2021 for the symptoms described in the history of present illness. He was evaluated in the context of the global COVID-19 pandemic, which necessitated consideration that the patient might be at risk for infection with the SARS-CoV-2 virus that causes COVID-19. Institutional protocols and algorithms that pertain to the evaluation of patients at risk for COVID-19 are in a state of rapid change based on information released by regulatory bodies including the CDC and federal and state organizations. These policies and algorithms were followed during the patient's care in the ED.   Final Clinical Impression(s) / ED Diagnoses Final diagnoses:  COVID-19  Nausea and vomiting, unspecified vomiting type  Rx / DC Orders ED Discharge Orders          Ordered    acetaminophen (TYLENOL) 500 MG tablet  Every 6 hours PRN        01/20/21 1618    ondansetron (ZOFRAN ODT) 4 MG disintegrating tablet  Every 8 hours PRN        01/20/21 1618    benzonatate (TESSALON) 100 MG capsule  Every 8 hours         01/20/21 1618    promethazine-dextromethorphan (PROMETHAZINE-DM) 6.25-15 MG/5ML syrup  4 times daily PRN        01/20/21 1618             Tedd Sias, [place] 01/22/21 0030    Teressa Lower, MD 01/22/21 1725

## 2021-01-20 NOTE — ED Triage Notes (Signed)
Pt was here on 10/2 for similar. Pt and family reports that pt had syncopal episode with jerking movements. Having RLQ pain with n/v, cough and family reports pt had low bp and low pulse ox on Sunday.

## 2021-01-20 NOTE — ED Provider Notes (Signed)
Emergency Medicine Provider Triage Evaluation Note  Curtis Williamson , a 60 y.o. male  was evaluated in triage.  Pt complains of right lower quadrant abdominal pain and nausea.  Patient reports that nausea has been present since 10/2.  Abdominal pain has started over the last few days.  Pain is located to right lower quadrant.  Patient endorses diarrhea as well.  Review of Systems  Positive: Fever, right lower quadrant abdominal pain, nausea, diarrhea Negative: Chills, vomiting, melena, blood in stool, constipation, dysuria, hematuria, urinary frequency, syncope  Physical Exam  BP 128/63 (BP Location: Right Arm)   Pulse (!) 111   Temp 100.2 F (37.9 C) (Oral)   Resp 18   SpO2 99%  Gen:   Awake, no distress   Resp:  Normal effort  MSK:   Moves extremities without difficulty  Other:  Abdomen protuberant, soft, tenderness to right lower quadrant.  Medical Decision Making  Medically screening exam initiated at 10:11 AM.  Appropriate orders placed.  Robert Sperl was informed that the remainder of the evaluation will be completed by another provider, this initial triage assessment does not replace that evaluation, and the importance of remaining in the ED until their evaluation is complete.  The patient appears stable so that the remainder of the work up may be completed by another provider.      Haskel Schroeder, PA-C 01/20/21 1012    Benjiman Core, MD 01/20/21 [phone]

## 2021-01-20 NOTE — ED Notes (Signed)
Pt given sandwich bag and ginger ale.  

## 2021-01-20 NOTE — Discharge Instructions (Addendum)
You were diagnosed with COVID-19 today.  As we discussed your CT scan was without any evidence of appendicitis or other acute abnormality.  You were found to have some mild fatty liver disease you will need to follow-up with your primary care provider to discuss these findings.  Please primarily take Tylenol for fever Drink plenty of water Take Zofran as needed for nausea.  Return to the ER for any new or concerning symptoms.  Please use Tylenol or ibuprofen for pain.  You may use 400 mg ibuprofen every 6 hours and 1000 mg of Tylenol every 6 hours (please know that this includes tylenol in other forms/can be found in pain medicine such as oxycodone or tylenol #3). You may choose to alternate between the 2.  This would be most effective.  Not to exceed 4 g of Tylenol within 24 hours.  Not to exceed 3200 mg ibuprofen 24 hours.

## 2021-01-20 NOTE — Progress Notes (Signed)
Patient c/o BP drooping for the last few months. Patient said he was taken to ER  Patient said he stop taking his medication until he can get it adjusted

## 2021-01-20 NOTE — Telephone Encounter (Signed)
Transition Care Management Follow-up Telephone Call Date of discharge and from where: 01/20/2021 from [facility] ED How have you been since you were released from the hospital? Pt stated that he is not feeling well. Recent dx for COVID Any questions or concerns? No  Items Reviewed: Did the pt receive and understand the discharge instructions provided? Yes  Medications obtained and verified? Yes  Other? No  Any new allergies since your discharge? No  Dietary orders reviewed? No Do you have support at home? Yes   Functional Questionnaire: (I = Independent and D = Dependent) ADLs: I  Bathing/Dressing- I  Meal Prep- I  Eating- I  Maintaining continence- I  Transferring/Ambulation- I  Managing Meds- I  Follow up appointments reviewed:  PCP Hospital f/u appt confirmed? No   Specialist Hospital f/u appt confirmed? No   Are transportation arrangements needed? No  If their condition worsens, is the pt aware to call PCP or go to the Emergency Dept.? Yes Was the patient provided with contact information for the PCP's office or ED? Yes Was to pt encouraged to call back with questions or concerns? Yes

## 2021-01-20 NOTE — Telephone Encounter (Signed)
Transition Care Management Follow-up Telephone Call Date of discharge and from where: 01/17/2021 from [facility] ED How have you been since you were released from the hospital? Pt stated that he is not feeling well. Pt went back to the ED and was dx with COVID.  Any questions or concerns? No  Items Reviewed: Did the pt receive and understand the discharge instructions provided? Yes  Medications obtained and verified? Yes  Other? No  Any new allergies since your discharge? No  Dietary orders reviewed? No Do you have support at home? Yes   Functional Questionnaire: (I = Independent and D = Dependent) ADLs: I  Bathing/Dressing- I  Meal Prep- I  Eating- I  Maintaining continence- I  Transferring/Ambulation- I  Managing Meds- I  Follow up appointments reviewed:  PCP Hospital f/u appt confirmed? No   Specialist Hospital f/u appt confirmed? No   Are transportation arrangements needed? No  If their condition worsens, is the pt aware to call PCP or go to the Emergency Dept.? Yes Was the patient provided with contact information for the PCP's office or ED? Yes Was to pt encouraged to call back with questions or concerns? Yes

## 2021-01-21 ENCOUNTER — Other Ambulatory Visit: Payer: Self-pay | Admitting: Surgical

## 2021-01-21 ENCOUNTER — Encounter: Payer: Self-pay | Admitting: Family Medicine

## 2021-01-21 NOTE — Progress Notes (Signed)
Established Patient Office Visit  Subjective:  Patient ID: Curtis Williamson, male    DOB: 06/16/1960  Age: 60 y.o. MRN: [id number]  CC:  Chief Complaint  Patient presents with   Follow-up   Hypertension   Diabetes    HPI Curtis Williamson presents for to follow-up on chronic medical issues including diabetes and hypertension.  Patient reports however that on today he is feeling ill with abdominal pain nausea and some vomiting.  He reports that over the last couple of days he has been seen in the emergency department but after treatment he was sent home and felt better.  He reports however that the symptoms have persisted and now he is feeling again where he was feeling a couple of days ago.  Past Medical History:  Diagnosis Date   Chronic kidney disease    only has one kidney   Diabetes mellitus without complication ([place])    Essential hypertension    Hyperlipidemia    Hypertension    Phreesia 01/13/2020      Social History   Socioeconomic History   Marital status: Single    Spouse name: Not on file   Number of children: 1   Years of education: Not on file   Highest education level: Some college, no degree  Occupational History   Occupation: Administrator  Tobacco Use   Smoking status: Every Day    Packs/day: 0.25    Years: 30.00    Pack years: 7.50    Types: Cigarettes   Smokeless tobacco: Never   Tobacco comments:    off and on, has tried to quit  Vaping Use   Vaping Use: Never used  Substance and Sexual Activity   Alcohol use: Yes    Comment: rarely   Drug use: Never   Sexual activity: Not Currently  Other Topics Concern   Not on file  Social History Narrative   Right handed   One story home   No caffeine    Social Determinants of Health   Financial Resource Strain: Not on file  Food Insecurity: Not on file  Transportation Needs: Not on file  Physical Activity: Not on file  Stress: Not on file  Social Connections: Not on file  Intimate  Partner Violence: Not on file    ROS Review of Systems  Gastrointestinal:  Positive for abdominal pain, nausea and vomiting.  Neurological:  Positive for weakness.  All other systems reviewed and are negative.  Objective:   Today's Vitals: Ht _0  (1.778 m)   Wt 201 lb 12.8 oz (91.5 kg)   SpO2 93%   BMI 28.96 kg/m   Physical Exam Vitals and nursing note reviewed.  Constitutional:      General: He is in acute distress.     Appearance: He is ill-appearing.  Cardiovascular:     Rate and Rhythm: Normal rate and regular rhythm.  Pulmonary:     Effort: Pulmonary effort is normal.     Breath sounds: Normal breath sounds.  Abdominal:     Palpations: Abdomen is soft. There is no mass.     Tenderness: There is abdominal tenderness. There is no guarding or rebound.  Skin:    General: Skin is warm and dry.  Neurological:     General: No focal deficit present.     Mental Status: He is alert and oriented to person, place, and time.    Assessment & Plan:   1. Hypoxia Patient with pulselike noted in the office around  91%.  Patient was referred to the ED for further evaluation and management.  Patient relative will be transporting by private vehicle.  Patient voiced understanding of importance of going directly to the emergency department.  Patient appears stable for transport by private vehicle.  2. Fever, unspecified Patient noted office to have elevated temperature above 101.  Patient referred to ED as above.  3. Generalized abdominal pain Patient was noted in office to have generalized abdominal pain.  Patient referred to ED as above.  4. Nausea Patient is nauseated in the office.  Patient referred to ED as above.  Outpatient Encounter Medications as of 01/20/2021  Medication Sig   Accu-Chek Softclix Lancets lancets Use as instructed   acetaminophen-codeine (TYLENOL #3) 300-30 MG tablet Take 1 tablet by mouth every 12 (twelve) hours as needed for moderate pain.    atorvastatin (LIPITOR) 10 MG tablet TAKE 1 TABLET (10 MG TOTAL) BY MOUTH DAILY.   baclofen (LIORESAL) 10 MG tablet Take 0.5-1 tablets (5-10 mg total) by mouth 3 (three) times daily as needed for muscle spasms.   Blood Glucose Monitoring Suppl (ACCU-CHEK GUIDE ME) w/Device KIT 1 each by Does not apply route 2 (two) times daily. Use to check FSBS twice a day. Dx: E11.9   cefdinir (OMNICEF) 300 MG capsule Take 1 capsule (300 mg total) by mouth 2 (two) times daily.   chlorzoxazone (PARAFON) 500 MG tablet TAKE 1 TABLET BY MOUTH THREE TIMES A DAY AS NEEDED FOR MUSCLE SPASMS (Patient taking differently: Take 500 mg by mouth 3 (three) times daily as needed for muscle spasms.)   docusate sodium (COLACE) 100 MG capsule Take 1 capsule (100 mg total) by mouth 2 (two) times daily.   empagliflozin (JARDIANCE) 10 MG TABS tablet Take 1 tablet (10 mg total) by mouth daily before breakfast.   fluticasone (FLONASE) 50 MCG/ACT nasal spray SPRAY 2 SPRAYS INTO EACH NOSTRIL EVERY DAY (Patient taking differently: Place 2 sprays into both nostrils daily.)   gabapentin (NEURONTIN) 300 MG capsule Take 1 capsule (300 mg total) by mouth 3 (three) times daily.   glucose blood (ACCU-CHEK GUIDE) test strip Use as instructed   hydrOXYzine (ATARAX/VISTARIL) 25 MG tablet Take 1 tablet (25 mg total) by mouth every 6 (six) hours.   lisinopril-hydrochlorothiazide (ZESTORETIC) 10-12.5 MG tablet Take 1 tablet by mouth daily.   meloxicam (MOBIC) 15 MG tablet Take 1 tablet (15 mg total) by mouth daily.   metFORMIN (GLUCOPHAGE) 1000 MG tablet Take 1 tablet (1,000 mg total) by mouth 2 (two) times daily with a meal.   traZODone (DESYREL) 50 MG tablet TAKE 0.5-1 TABLETS (25-50 MG TOTAL) BY MOUTH AT BEDTIME AS NEEDED FOR SLEEP.   triamcinolone cream (KENALOG) 0.1 % Apply 1 application topically 2 (two) times daily.   No facility-administered encounter medications on file as of 01/20/2021.    Follow-up: No follow-ups on file.   Becky Sax, MD

## 2021-02-01 DIAGNOSIS — Z20822 Contact with and (suspected) exposure to covid-19: Secondary | ICD-10-CM | POA: Diagnosis not present

## 2021-02-03 DIAGNOSIS — H25812 Combined forms of age-related cataract, left eye: Secondary | ICD-10-CM | POA: Diagnosis not present

## 2021-02-03 DIAGNOSIS — H2511 Age-related nuclear cataract, right eye: Secondary | ICD-10-CM | POA: Diagnosis not present

## 2021-02-09 DIAGNOSIS — H5213 Myopia, bilateral: Secondary | ICD-10-CM | POA: Diagnosis not present

## 2021-02-17 DIAGNOSIS — H401111 Primary open-angle glaucoma, right eye, mild stage: Secondary | ICD-10-CM | POA: Diagnosis not present

## 2021-03-08 DIAGNOSIS — Z0100 Encounter for examination of eyes and vision without abnormal findings: Secondary | ICD-10-CM | POA: Diagnosis not present

## 2021-03-15 ENCOUNTER — Telehealth: Payer: Self-pay | Admitting: Orthopedic Surgery

## 2021-03-15 NOTE — Telephone Encounter (Signed)
Pt called requesting a renewal of handicap placard. Please call pt when ready for pick up. Pt phone number is [phone].

## 2021-03-15 NOTE — Telephone Encounter (Signed)
Patient has been notified that handicap was ready for pick up.

## 2021-03-17 DIAGNOSIS — H2513 Age-related nuclear cataract, bilateral: Secondary | ICD-10-CM | POA: Diagnosis not present

## 2021-03-17 DIAGNOSIS — H401122 Primary open-angle glaucoma, left eye, moderate stage: Secondary | ICD-10-CM | POA: Diagnosis not present

## 2021-03-17 DIAGNOSIS — H401111 Primary open-angle glaucoma, right eye, mild stage: Secondary | ICD-10-CM | POA: Diagnosis not present

## 2021-03-29 ENCOUNTER — Telehealth: Payer: Self-pay | Admitting: Orthopedic Surgery

## 2021-03-29 NOTE — Telephone Encounter (Signed)
Patient called advised he is still having a great deal of pain in his left shoulder and it's been 9 months out. Patient asked should he be experiencing this much pain after 9 month? The number to contact patient is [phone]

## 2021-03-29 NOTE — Telephone Encounter (Signed)
Usually the rotator cuff symptoms have plateaued around 9 to 12 months.  If he wants to come back in we can look at it again but in general the last time we looked at them the cuff repair felt pretty good and I doubt there is any other intervention to do.  The most aggressive thing we would do would be to check some blood work for infection labs.  Please call.  Thanks

## 2021-03-30 NOTE — Telephone Encounter (Signed)
IC discussed with patient. He verbalized understanding. He will keep appt with Dr [name] coming up

## 2021-04-03 ENCOUNTER — Other Ambulatory Visit: Payer: Self-pay | Admitting: Family

## 2021-04-03 DIAGNOSIS — E119 Type 2 diabetes mellitus without complications: Secondary | ICD-10-CM

## 2021-04-05 NOTE — Telephone Encounter (Signed)
Patient needs appointment with [facility] for refill

## 2021-04-05 NOTE — Telephone Encounter (Signed)
Make patient aware importance of scheduling an appointment with Georganna Skeans, MD.

## 2021-04-07 ENCOUNTER — Ambulatory Visit (INDEPENDENT_AMBULATORY_CARE_PROVIDER_SITE_OTHER): Payer: Worker's Compensation | Admitting: Orthopedic Surgery

## 2021-04-07 ENCOUNTER — Other Ambulatory Visit: Payer: Self-pay

## 2021-04-07 DIAGNOSIS — S46012D Strain of muscle(s) and tendon(s) of the rotator cuff of left shoulder, subsequent encounter: Secondary | ICD-10-CM | POA: Diagnosis not present

## 2021-04-12 ENCOUNTER — Other Ambulatory Visit: Payer: Self-pay | Admitting: Physician Assistant

## 2021-04-12 ENCOUNTER — Encounter: Payer: Self-pay | Admitting: Orthopedic Surgery

## 2021-04-12 DIAGNOSIS — G4700 Insomnia, unspecified: Secondary | ICD-10-CM

## 2021-04-12 DIAGNOSIS — E119 Type 2 diabetes mellitus without complications: Secondary | ICD-10-CM | POA: Diagnosis not present

## 2021-04-12 NOTE — Progress Notes (Signed)
Office Visit Note   Patient: Curtis Williamson           Date of Birth: [birth year]           MRN: [id number] Visit Date: 04/07/2021 Requested by: [name], MD [address] [place],  [place] [address] PCP: Georganna Skeans, MD  Subjective: Chief Complaint  Patient presents with   Left Shoulder - Follow-up    HPI: Curtis Williamson is a patient who is now about 8 months out left shoulder rotator cuff repair.  He has plateaued in physical therapy and had an FCE.  That FCE is reviewed.  They recommended 50 pound lifting limit floor to waist 20 pounds waist to shoulder and 10 pounds overhead.  He has not yet gone back to work.  He has been doing home exercise program.  He is a smoker.  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              ROS: All systems reviewed are negative as they relate to the chief complaint within the history of present illness.  Patient denies  fevers or chills.   Assessment & Plan: Visit Diagnoses:  1. Traumatic complete tear of left rotator cuff, subsequent encounter     Plan: Impression is left shoulder pain with pretty reasonable strength on examination today.  No physical exam evidence that rotator cuff integrity is not still intact.  Has reasonable strength to infraspinatus supraspinatus and subscap muscle testing is 5+ out of 5.  Still slightly restricted in range of motion but that could continue to improve.  Plan at this time is to go with the FCE recommendations for lifting.  I think that 50 pounds floor to waist is achievable for Curtis Williamson at this time.  20 pounds waist to shoulder also reasonable.  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  I would not favor overhead lifting more than 20 pounds but the floor to waist I think would be okay to do at this time.  Follow-up with [place] as needed.  Follow-Up Instructions: Return if symptoms worsen or fail to improve.   Orders:  No orders of the defined types were placed in this encounter.  No orders of the defined types were placed in this encounter.     Procedures: No procedures performed   Clinical Data: No additional findings.  Objective: Vital Signs: There were no vitals taken for this visit.  Physical Exam:   Constitutional: Patient appears well-developed HEENT:  Head: Normocephalic Eyes:EOM are normal Neck: Normal range of motion Cardiovascular: Normal rate Pulmonary/chest: Effort normal Neurologic: Patient is alert Skin: Skin is warm Psychiatric: Patient has normal mood and affect   Ortho Exam: Ortho exam demonstrates passive range of motion of 30/80/150.  Subscap strength 5 out of 5 external rotation and supraspinatus/infraspinatus strength 5 out of 5 with no coarse grinding or crepitus present with internal and external rotation 90 degrees of AB duction.  Incision intact.  No AC joint tenderness to direct palpation.  Specialty Comments:  No specialty comments available.  Imaging: No results found.   PMFS History: Patient Active Problem List  Diagnosis Date Noted   Low back pain 10/28/2020   Pain due to onychomycosis of toenails of both feet 10/09/2020   Hyperlipidemia 09/22/2020   Thickened nails 09/22/2020   Insomnia 09/22/2020   Acute pain of left shoulder 05/19/2020   Tobacco dependence 03/26/2019   Essential hypertension 03/26/2019   Type 2 diabetes mellitus without complication, without long-term current use of insulin (HCC) 03/26/2019   Aneurysm, cerebral, nonruptured 03/26/2019   Past Medical History:  Diagnosis Date   Chronic kidney disease    only has one kidney   Diabetes mellitus without complication  (H[facility])    Essential hypertension    Hyperlipidemia    Hypertension    Phreesia 01/13/2020    Family History  Problem Relation Age of Onset   Hypertension Mother    Kidney failure Mother    Kidney disease Mother    Heart disease Mother    ADD / ADHD Daughter    Anxiety disorder Daughter    Alcohol abuse Father    Colon cancer Neg Hx    Stomach cancer Neg Hx    Esophageal cancer Neg Hx    Colon polyps Neg Hx     Past Surgical History:  Procedure Laterality Date   KIDNEY DONATION     MANDIBLE FRACTURE SURGERY     ROTATOR CUFF REPAIR Right    Social History   Occupational History   Occupation: Truck driver  Tobacco Use   Smoking status: Every Day    Packs/day: 0.25    Years: 30.00    Pack years: 7.50    Types: Cigarettes   Smokeless tobacco: Never   Tobacco comments:    off and on, has tried to quit  Vaping Use   Vaping Use: Never used  Substance and Sexual Activity   Alcohol use: Yes    Comment: rarely   Drug use: Never   Sexual activity: Not Currently

## 2021-04-15 DIAGNOSIS — E119 Type 2 diabetes mellitus without complications: Secondary | ICD-10-CM | POA: Diagnosis not present

## 2021-04-16 ENCOUNTER — Ambulatory Visit (INDEPENDENT_AMBULATORY_CARE_PROVIDER_SITE_OTHER): Payer: Medicaid Other | Admitting: Podiatry

## 2021-04-16 ENCOUNTER — Encounter: Payer: Self-pay | Admitting: Podiatry

## 2021-04-16 ENCOUNTER — Other Ambulatory Visit: Payer: Self-pay

## 2021-04-16 DIAGNOSIS — M79674 Pain in right toe(s): Secondary | ICD-10-CM | POA: Diagnosis not present

## 2021-04-16 DIAGNOSIS — B351 Tinea unguium: Secondary | ICD-10-CM | POA: Diagnosis not present

## 2021-04-16 DIAGNOSIS — E119 Type 2 diabetes mellitus without complications: Secondary | ICD-10-CM

## 2021-04-16 DIAGNOSIS — M79675 Pain in left toe(s): Secondary | ICD-10-CM | POA: Diagnosis not present

## 2021-04-16 NOTE — Progress Notes (Signed)
This patient returns to my office for at risk foot care.  This patient requires this care by a professional since this patient will be at risk due to having diabetes and CKD.    This patient is unable to cut nails himself since the patient cannot reach his nails.These nails are painful walking and wearing shoes.  This patient presents for at risk foot care today.  General Appearance  Alert, conversant and in no acute stress.  Vascular  Dorsalis pedis and posterior tibial  pulses are palpable  bilaterally.  Capillary return is within normal limits  bilaterally. Temperature is within normal limits  bilaterally.  Neurologic  Senn-Weinstein monofilament wire test within normal limits  bilaterally. Muscle power within normal limits bilaterally.  Nails Thick disfigured discolored nails with subungual debris  from hallux to fifth toes bilaterally. No evidence of bacterial infection or drainage bilaterally.  Orthopedic  No limitations of motion  feet .  No crepitus or effusions noted.  No bony pathology or digital deformities noted.  Bone spur fifth toe right foot.  Skin  normotropic skin with no porokeratosis noted bilaterally.  No signs of infections or ulcers noted.     Onychomycosis  Pain in right toes  Pain in left toes  Consent was obtained for treatment procedures.   Mechanical debridement of nails 1-5  bilaterally performed with a nail nipper.  Filed with dremel without incident.    Return office visit    3 months                 Told patient to return for periodic foot care and evaluation due to potential at risk complications.   Helane Gunther DPM

## 2021-04-21 ENCOUNTER — Telehealth: Payer: Self-pay | Admitting: Orthopedic Surgery

## 2021-04-21 NOTE — Telephone Encounter (Signed)
Pt called stating he had an appt on 04/07/21 and his case manager was supposed to get copies of his progress notes. Pt states his lawyer never got her copy and now he can't get in touch with his case manager. Pt would like [place] to fax the progress notes and then give him a call when that's been done please.  Lawyer Fax# [phone] Pt# [phone]

## 2021-04-29 ENCOUNTER — Other Ambulatory Visit: Payer: Self-pay | Admitting: Family

## 2021-04-29 DIAGNOSIS — E119 Type 2 diabetes mellitus without complications: Secondary | ICD-10-CM

## 2021-05-18 DIAGNOSIS — E119 Type 2 diabetes mellitus without complications: Secondary | ICD-10-CM | POA: Diagnosis not present

## 2021-06-17 DIAGNOSIS — E119 Type 2 diabetes mellitus without complications: Secondary | ICD-10-CM | POA: Diagnosis not present

## 2021-07-16 ENCOUNTER — Ambulatory Visit: Payer: Medicaid Other | Admitting: Podiatry

## 2021-07-27 DIAGNOSIS — I10 Essential (primary) hypertension: Secondary | ICD-10-CM | POA: Diagnosis not present

## 2021-07-28 DIAGNOSIS — H401111 Primary open-angle glaucoma, right eye, mild stage: Secondary | ICD-10-CM | POA: Diagnosis not present

## 2021-07-28 DIAGNOSIS — H2513 Age-related nuclear cataract, bilateral: Secondary | ICD-10-CM | POA: Diagnosis not present

## 2021-07-28 DIAGNOSIS — H401122 Primary open-angle glaucoma, left eye, moderate stage: Secondary | ICD-10-CM | POA: Diagnosis not present

## 2021-08-25 ENCOUNTER — Ambulatory Visit: Payer: Self-pay | Admitting: *Deleted

## 2021-08-25 NOTE — Telephone Encounter (Signed)
?  Chief Complaint:Blood pressure high and low ?Symptoms: "Maybe passed out 2 weeks ago" ?Frequency: Last episode 2 weeks ago ?Pertinent Negatives: Patient denies no symptoms presently except slight nausea. Denies CP ?Disposition: [] ED /[] Urgent Care (no appt availability in office) / [x] Appointment(In office/virtual)/ []  [facility]/ [] Home Care/ [] Refused Recommended Disposition /[] [facility]/ []  Follow-up with PCP ?Additional Notes: Pt evasive historian. States episode 2 weeks ago was witnessed, he was outside,hot "Maybe passed out, I was told I was shaking, sat in my car with the air on,went home took a cold shower and felt better." No BP values at that time. States on 4/28/ BP 82/51  days later 121/90 HR 111. During call 128/90. States BS has been running high, no values. Secured next available appt 09/02/21, placed on Wait list. Advised ED if symptoms return. Advised to journal BP and blood sugars. Care advise per protocol, pt verbalizes understanding. ?Reason for Disposition ? Brief (now gone) dizziness or lightheadedness after standing up or eating ? ?Answer Assessment - Initial Assessment Questions ?1. BLOOD PRESSURE: "What is the blood pressure?" "Did you take at least two measurements 5 minutes apart?" ?    128/90  HR 92 ?2. ONSET: "When did you take your blood pressure?" ?    During call ?3. HOW: "How did you obtain the blood pressure?" (e.g., visiting nurse, automatic home BP monitor) ?    Home monitor ?4. HISTORY: "Do you have a history of low blood pressure?" "What is your blood pressure normally?" ?     ?5. MEDICATIONS: "Are you taking any medications for blood pressure?" If Yes, ask: "Have they been changed recently?" ?    Yes, not daily, no changes ?6. PULSE RATE: "Do you know what your pulse rate is?"  ?    92 ?7. OTHER SYMPTOMS: "Have you been sick recently?" "Have you had a recent injury?" ?    Nausea ? ?Protocols used: Blood Pressure - Low-A-AH ? ?

## 2021-08-28 ENCOUNTER — Other Ambulatory Visit: Payer: Self-pay | Admitting: Family Medicine

## 2021-08-28 DIAGNOSIS — E119 Type 2 diabetes mellitus without complications: Secondary | ICD-10-CM

## 2021-09-02 ENCOUNTER — Ambulatory Visit (INDEPENDENT_AMBULATORY_CARE_PROVIDER_SITE_OTHER): Payer: Medicaid Other | Admitting: Physician Assistant

## 2021-09-02 ENCOUNTER — Encounter: Payer: Self-pay | Admitting: Physician Assistant

## 2021-09-02 VITALS — BP 128/79 | HR 90 | Temp 98.2°F | Resp 18 | Ht 71.0 in | Wt 192.0 lb

## 2021-09-02 DIAGNOSIS — I10 Essential (primary) hypertension: Secondary | ICD-10-CM

## 2021-09-02 DIAGNOSIS — R197 Diarrhea, unspecified: Secondary | ICD-10-CM | POA: Diagnosis not present

## 2021-09-02 DIAGNOSIS — E1165 Type 2 diabetes mellitus with hyperglycemia: Secondary | ICD-10-CM | POA: Diagnosis not present

## 2021-09-02 DIAGNOSIS — Z905 Acquired absence of kidney: Secondary | ICD-10-CM

## 2021-09-02 DIAGNOSIS — Z114 Encounter for screening for human immunodeficiency virus [HIV]: Secondary | ICD-10-CM

## 2021-09-02 DIAGNOSIS — E119 Type 2 diabetes mellitus without complications: Secondary | ICD-10-CM | POA: Diagnosis not present

## 2021-09-02 LAB — POCT GLYCOSYLATED HEMOGLOBIN (HGB A1C): Hemoglobin A1C: 10.3 % — AB (ref 4.0–5.6)

## 2021-09-02 MED ORDER — METFORMIN HCL 500 MG PO TABS: 500.0000 mg | ORAL_TABLET | Freq: Two times a day (BID) | ORAL | 1 refills | Status: DC

## 2021-09-02 MED ORDER — EMPAGLIFLOZIN 25 MG PO TABS: 25.0000 mg | ORAL_TABLET | Freq: Every day | ORAL | 1 refills | Status: DC

## 2021-09-02 NOTE — Progress Notes (Addendum)
Established Patient Office Visit  Subjective   Patient ID: Curtis Williamson, male    DOB: 31-Dec-1960  Age: 61 y.o. MRN: [id number]  Chief Complaint  Patient presents with   Blood Pressure Check    States that he has been having episodes of low blood pressure readings.  States that on April 28 he had an episode where he feels he got overheated, states that he sat in his vehicle with the air conditioning on, states that he went home and took a shower and felt better afterwards.  States that he did not check his blood pressure until later that day.  States that he did have people tell him that he passed out, but he states that he remembers the episode.  States that he did have excessive sweating and vomited.  States that he checked his blood pressure and it was 82/51, states that he checked it a couple days later and it was 121/91.  States that he did not go to the hospital for evaluation after this episode.  States that he has some form of diarrhea most days.  States that he feels it is from the metformin.  States that some days he feels the diarrhea is worse and it is dehydrating him.  States that he works hard to stay well-hydrated with plenty of water, denies alcohol or caffeine use.  States that since then he has been checking his blood pressure every day.  States that if "his top number is less than 120 he will not take his blood pressure medication that day".  Endorses that his blood glucose levels have been over 200 in the morning.  Does endorse that he is not following a diabetic diet, states that he had donuts for breakfast this morning, yesterday had [place] toast with honey.   Past Medical History:  Diagnosis Date   Chronic kidney disease    only has one kidney   Diabetes mellitus without complication ([facility])    Essential hypertension    Hyperlipidemia    Hypertension    Phreesia 01/13/2020   Social History   Socioeconomic History   Marital status: Single    Spouse name:  Not on file   Number of children: 1   Years of education: Not on file   Highest education level: Some college, no degree  Occupational History   Occupation: Naval architect  Tobacco Use   Smoking status: Every Day    Packs/day: 0.50    Years: 30.00    Pack years: 15.00    Types: Cigarettes   Smokeless tobacco: Never   Tobacco comments:    off and on, has tried to quit  Vaping Use   Vaping Use: Never used  Substance and Sexual Activity   Alcohol use: Yes    Comment: rarely   Drug use: Never   Sexual activity: Not Currently  Other Topics Concern   Not on file  Social History Narrative   Right handed   One story home   No caffeine    Social Determinants of Health   Financial Resource Strain: Not on file  Food Insecurity: Not on file  Transportation Needs: Not on file  Physical Activity: Not on file  Stress: Not on file  Social Connections: Not on file  Intimate Partner Violence: Not on file   Family History  Problem Relation Age of Onset   Hypertension Mother    Kidney failure Mother    Kidney disease Mother    Heart disease Mother  ADD / ADHD Daughter    Anxiety disorder Daughter    Alcohol abuse Father    Colon cancer Neg Hx    Stomach cancer Neg Hx    Esophageal cancer Neg Hx    Colon polyps Neg Hx    Allergies  Allergen Reactions   Mushroom Extract Complex       Review of Systems  Constitutional: Negative.   HENT: Negative.    Eyes: Negative.   Respiratory:  Negative for shortness of breath.   Cardiovascular:  Negative for chest pain and palpitations.  Gastrointestinal:  Positive for diarrhea. Negative for abdominal pain.  Genitourinary: Negative.   Musculoskeletal: Negative.   Skin: Negative.   Neurological:  Negative for dizziness, loss of consciousness and headaches.  Endo/Heme/Allergies: Negative.   Psychiatric/Behavioral: Negative.       Objective:     BP 128/79 (BP Location: Right Arm, Patient Position: Sitting, Cuff Size: Normal)    Pulse 90   Temp 98.2 F (36.8 C) (Oral)   Resp 18   Ht 5\' 11"  (1.803 m)   Wt 192 lb (87.1 kg)   SpO2 95%   BMI 26.78 kg/m  BP Readings from Last 3 Encounters:  09/02/21 128/79  01/20/21 138/85  01/18/21 112/87   Wt Readings from Last 3 Encounters:  09/02/21 192 lb (87.1 kg)  01/20/21 201 lb 12.8 oz (91.5 kg)  12/23/20 193 lb 9.6 oz (87.8 kg)      Physical Exam Vitals and nursing note reviewed.  Constitutional:      General: He is not in acute distress.    Appearance: Normal appearance. He is not ill-appearing.  HENT:     Head: Normocephalic and atraumatic.     Right Ear: External ear normal.     Left Ear: External ear normal.     Nose: Nose normal.     Mouth/Throat:     Mouth: Mucous membranes are moist.     Pharynx: Oropharynx is clear.  Eyes:     Extraocular Movements: Extraocular movements intact.     Conjunctiva/sclera: Conjunctivae normal.     Pupils: Pupils are equal, round, and reactive to light.  Cardiovascular:     Rate and Rhythm: Normal rate and regular rhythm.     Pulses: Normal pulses.     Heart sounds: Normal heart sounds.  Pulmonary:     Effort: Pulmonary effort is normal.     Breath sounds: Normal breath sounds.  Musculoskeletal:        General: Normal range of motion.     Cervical back: Normal range of motion and neck supple.  Skin:    General: Skin is warm and dry.  Neurological:     General: No focal deficit present.     Mental Status: He is alert and oriented to person, place, and time.  Psychiatric:        Mood and Affect: Mood normal.        Behavior: Behavior normal.        Thought Content: Thought content normal.        Judgment: Judgment normal.       Assessment & Plan:   Problem List Items Addressed This Visit       Cardiovascular and Mediastinum   Essential hypertension     Endocrine   Type 2 diabetes mellitus without complication, without long-term current use of insulin (HCC) - Primary   Relevant Medications    metFORMIN (GLUCOPHAGE) 500 MG tablet   empagliflozin (JARDIANCE) 25 MG  TABS tablet   Other Visit Diagnoses     Uncontrolled type 2 diabetes mellitus with hyperglycemia (HCC)       Relevant Medications   metFORMIN (GLUCOPHAGE) 500 MG tablet   empagliflozin (JARDIANCE) 25 MG TABS tablet   Acquired absence of left kidney       Diarrhea, unspecified type       Screening for HIV without presence of risk factors       Relevant Orders   HIV antibody (with reflex)     1. Type 2 diabetes mellitus with hyperglycemia, without long-term current use of insulin (HCC) A1C 10.3.  Patient is having difficulty tolerating metformin, reduce dosing 500 mg twice a day.  Increase Jardiance to 25 mg.  Encouraged patient to eliminate added sugars  Encouraged to check blood glucose levels on a daily basis, keep a written log and have available for all office visits.  Patient given appointment to follow-up with primary care provider in 4 weeks.  Red flags given for prompt reevaluation. - HgB A1c - metFORMIN (GLUCOPHAGE) 500 MG tablet; Take 1 tablet (500 mg total) by mouth 2 (two) times daily with a meal.  Dispense: 60 tablet; Refill: 1 - empagliflozin (JARDIANCE) 25 MG TABS tablet; Take 1 tablet (25 mg total) by mouth daily before breakfast.  Dispense: 30 tablet; Refill: 1 - CBC with Differential/Platelet - Comp. Metabolic Panel (12) - TSH  2. Uncontrolled type 2 diabetes mellitus with hyperglycemia (HCC)   3. Acquired absence of left kidney   4. Essential hypertension Patient encouraged to increase water intake, continue checking blood pressure on a daily basis, keep a written log and have available for all office visits.  Red flags given for prompt reevaluation  5. Diarrhea, unspecified type   6. Screening for HIV without presence of risk factors  - HIV antibody (with reflex)    I have reviewed the patient's medical history (PMH, PSH, Social History, Family History, Medications, and allergies)  , and have been updated if relevant. I spent 30 minutes reviewing chart and  face to face time with patient.   Return in about 4 weeks (around 09/30/2021) for [facility].    Kasandra Knudsenari S Mayers, PA-C

## 2021-09-02 NOTE — Progress Notes (Signed)
BP 138/98 BEFORE MEDICATION HR 101. CBG 156 FASTING Patient reports recent nausea and syncope episodes.

## 2021-09-02 NOTE — Patient Instructions (Signed)
You are going to start taking a higher dose of Jardiance 25 mg, and lower dose of metformin 500 mg twice a day.  I strongly encourage you to discontinue any food with added sugar.  You are going to check your blood sugar levels, your blood pressure and your pulse every morning and keep a written log and bring that with you when you follow-up with Dr. Andrey Campanile in 1 month so she can review those levels and make any other changes at that time.  We will call you with today's lab results.  Roney Jaffe, PA-C Physician Assistant [facility] [email or web address]   Carbohydrate Counting for Diabetes Mellitus, Adult Carbohydrate counting is a method of keeping track of how many carbohydrates you eat. Eating carbohydrates increases the amount of sugar (glucose) in the blood. Counting how many carbohydrates you eat improves how well you manage your blood glucose. This, in turn, helps you manage your diabetes. Carbohydrates are measured in grams (g) per serving. It is important to know how many carbohydrates (in grams or by serving size) you can have in each meal. This is different for every person. A dietitian can help you make a meal plan and calculate how many carbohydrates you should have at each meal and snack. What foods contain carbohydrates? Carbohydrates are found in the following foods: Grains, such as breads and cereals. Dried beans and soy products. Starchy vegetables, such as potatoes, peas, and corn. Fruit and fruit juices. Milk and yogurt. Sweets and snack foods, such as cake, cookies, candy, chips, and soft drinks. How do I count carbohydrates in foods? There are two ways to count carbohydrates in food. You can read food labels or learn standard serving sizes of foods. You can use either of these methods or a combination of both. Using the Nutrition Facts label The Nutrition Facts list is included on the labels of almost all packaged  foods and beverages in the [place]. It includes: The serving size. Information about nutrients in each serving, including the grams of carbohydrate per serving. To use the Nutrition Facts, decide how many servings you will have. Then, multiply the number of servings by the number of carbohydrates per serving. The resulting number is the total grams of carbohydrates that you will be having. Learning the standard serving sizes of foods When you eat carbohydrate foods that are not packaged or do not include Nutrition Facts on the label, you need to measure the servings in order to count the grams of carbohydrates. Measure the foods that you will eat with a food scale or measuring cup, if needed. Decide how many standard-size servings you will eat. Multiply the number of servings by 15. For foods that contain carbohydrates, one serving equals 15 g of carbohydrates. For example, if you eat 2 cups or 10 oz (300 g) of strawberries, you will have eaten 2 servings and 30 g of carbohydrates (2 servings x 15 g = 30 g). For foods that have more than one food mixed, such as soups and casseroles, you must count the carbohydrates in each food that is included. The following list contains standard serving sizes of common carbohydrate-rich foods. Each of these servings has about 15 g of carbohydrates: 1 slice of bread. 1 six-inch (15 cm) tortilla. ? cup or 2 oz (53 g) cooked rice or pasta.  cup or 3 oz (85 g) cooked or canned, drained and rinsed beans or lentils.  cup or 3 oz (85 g) starchy vegetable, such as peas,  corn, or squash.  cup or 4 oz (120 g) hot cereal.  cup or 3 oz (85 g) boiled or mashed potatoes, or  or 3 oz (85 g) of a large baked potato.  cup or 4 fl oz (118 mL) fruit juice. 1 cup or 8 fl oz (237 mL) milk. 1 small or 4 oz (106 g) apple.  or 2 oz (63 g) of a medium banana. 1 cup or 5 oz (150 g) strawberries. 3 cups or 1 oz (28.3 g) popped popcorn. What is an example of  carbohydrate counting? To calculate the grams of carbohydrates in this sample meal, follow the steps shown below. Sample meal 3 oz (85 g) chicken breast. ? cup or 4 oz (106 g) brown rice.  cup or 3 oz (85 g) corn. 1 cup or 8 fl oz (237 mL) milk. 1 cup or 5 oz (150 g) strawberries with sugar-free whipped topping. Carbohydrate calculation Identify the foods that contain carbohydrates: Rice. Corn. Milk. Strawberries. Calculate how many servings you have of each food: 2 servings rice. 1 serving corn. 1 serving milk. 1 serving strawberries. Multiply each number of servings by 15 g: 2 servings rice x 15 g = 30 g. 1 serving corn x 15 g = 15 g. 1 serving milk x 15 g = 15 g. 1 serving strawberries x 15 g = 15 g. Add together all of the amounts to find the total grams of carbohydrates eaten: 30 g + 15 g + 15 g + 15 g = 75 g of carbohydrates total. What are tips for following this plan? Shopping Develop a meal plan and then make a shopping list. Buy fresh and frozen vegetables, fresh and frozen fruit, dairy, eggs, beans, lentils, and whole grains. Look at food labels. Choose foods that have more fiber and less sugar. Avoid processed foods and foods with added sugars. Meal planning Aim to have the same number of grams of carbohydrates at each meal and for each snack time. Plan to have regular, balanced meals and snacks. Where to find more information American Diabetes Association: diabetes.org Centers for Disease Control and Prevention: TonerPromos.no Academy of Nutrition and Dietetics: eatright.org Association of Diabetes Care & Education Specialists: diabeteseducator.org Summary Carbohydrate counting is a method of keeping track of how many carbohydrates you eat. Eating carbohydrates increases the amount of sugar (glucose) in your blood. Counting how many carbohydrates you eat improves how well you manage your blood glucose. This helps you manage your diabetes. A dietitian can help you  make a meal plan and calculate how many carbohydrates you should have at each meal and snack. This information is not intended to replace advice given to you by your health care provider. Make sure you discuss any questions you have with your health care provider. Document Revised: 11/06/2019 Document Reviewed: 11/06/2019 Elsevier Patient Education  2023 [employer].

## 2021-09-03 LAB — COMP. METABOLIC PANEL (12)
AST: 40 IU/L (ref 0–40)
Albumin/Globulin Ratio: 1.6 (ref 1.2–2.2)
Albumin: 4.5 g/dL (ref 3.8–4.8)
Alkaline Phosphatase: 92 IU/L (ref 44–121)
BUN/Creatinine Ratio: 11 (ref 10–24)
BUN: 14 mg/dL (ref 8–27)
Bilirubin Total: 0.3 mg/dL (ref 0.0–1.2)
Calcium: 10.2 mg/dL (ref 8.6–10.2)
Chloride: 101 mmol/L (ref 96–106)
Creatinine, Ser: 1.22 mg/dL (ref 0.76–1.27)
Globulin, Total: 2.8 g/dL (ref 1.5–4.5)
Glucose: 191 mg/dL — ABNORMAL HIGH (ref 70–99)
Potassium: 4.4 mmol/L (ref 3.5–5.2)
Sodium: 141 mmol/L (ref 134–144)
Total Protein: 7.3 g/dL (ref 6.0–8.5)
eGFR: 67 mL/min/{1.73_m2} (ref >59)

## 2021-09-03 LAB — TSH: TSH: 0.694 u[IU]/mL (ref 0.450–4.500)

## 2021-09-03 LAB — CBC WITH DIFFERENTIAL/PLATELET
Basophils Absolute: 0.1 10*3/uL (ref 0.0–0.2)
Basos: 1 %
EOS (ABSOLUTE): 0.5 10*3/uL — ABNORMAL HIGH (ref 0.0–0.4)
Eos: 4 %
Hematocrit: 46.4 % (ref 37.5–51.0)
Hemoglobin: 15.2 g/dL (ref 13.0–17.7)
Immature Grans (Abs): 0 10*3/uL (ref 0.0–0.1)
Immature Granulocytes: 0 %
Lymphocytes Absolute: 3.2 10*3/uL — ABNORMAL HIGH (ref 0.7–3.1)
Lymphs: 27 %
MCH: 28.6 pg (ref 26.6–33.0)
MCHC: 32.8 g/dL (ref 31.5–35.7)
MCV: 87 fL (ref 79–97)
Monocytes Absolute: 0.6 10*3/uL (ref 0.1–0.9)
Monocytes: 5 %
Neutrophils Absolute: 7.3 10*3/uL — ABNORMAL HIGH (ref 1.4–7.0)
Neutrophils: 63 %
Platelets: 332 10*3/uL (ref 150–450)
RBC: 5.32 x10E6/uL (ref 4.14–5.80)
RDW: 14.1 % (ref 11.6–15.4)
WBC: 11.8 10*3/uL — ABNORMAL HIGH (ref 3.4–10.8)

## 2021-09-03 LAB — HIV ANTIBODY (ROUTINE TESTING W REFLEX): HIV Screen 4th Generation wRfx: NONREACTIVE

## 2021-09-08 ENCOUNTER — Telehealth: Payer: Self-pay

## 2021-09-08 NOTE — Telephone Encounter (Signed)
-----   Message from Roney Jaffe, [place] sent at 09/07/2021  5:06 PM EDT ----- Please call patient and let him know that his thyroid function, kidney function and liver function are within normal limits.  He does not show signs of anemia.  He does have a slightly elevated white blood cell count which could be a sign of an infection, could also be due to allergies.  This is more than likely not attributed to his episodes of low blood pressure readings that he has been having.  His random blood glucose level was elevated as expected.  His screening for HIV was negative.

## 2021-09-24 ENCOUNTER — Other Ambulatory Visit: Payer: Self-pay | Admitting: Physician Assistant

## 2021-09-24 DIAGNOSIS — E1165 Type 2 diabetes mellitus with hyperglycemia: Secondary | ICD-10-CM

## 2021-09-24 NOTE — Telephone Encounter (Signed)
Metformin refilled for 60 day supply on 09/02/2021 by Carrolyn Meiers, PA. Call patient to confirm necessity of early refill request.

## 2021-09-28 DIAGNOSIS — E119 Type 2 diabetes mellitus without complications: Secondary | ICD-10-CM | POA: Diagnosis not present

## 2021-10-01 ENCOUNTER — Ambulatory Visit (INDEPENDENT_AMBULATORY_CARE_PROVIDER_SITE_OTHER): Payer: Medicaid Other | Admitting: Family Medicine

## 2021-10-01 ENCOUNTER — Encounter: Payer: Self-pay | Admitting: Family Medicine

## 2021-10-01 VITALS — BP 127/86 | HR 73 | Temp 98.1°F | Resp 16 | Ht 71.0 in | Wt 193.0 lb

## 2021-10-01 DIAGNOSIS — E785 Hyperlipidemia, unspecified: Secondary | ICD-10-CM | POA: Diagnosis not present

## 2021-10-01 DIAGNOSIS — E1165 Type 2 diabetes mellitus with hyperglycemia: Secondary | ICD-10-CM

## 2021-10-01 DIAGNOSIS — I10 Essential (primary) hypertension: Secondary | ICD-10-CM

## 2021-10-01 DIAGNOSIS — E1169 Type 2 diabetes mellitus with other specified complication: Secondary | ICD-10-CM | POA: Diagnosis not present

## 2021-10-01 MED ORDER — GABAPENTIN 300 MG PO CAPS: 300.0000 mg | ORAL_CAPSULE | Freq: Three times a day (TID) | ORAL | 1 refills | Status: DC

## 2021-10-01 MED ORDER — LISINOPRIL 10 MG PO TABS: 10.0000 mg | ORAL_TABLET | Freq: Every day | ORAL | 0 refills | Status: DC

## 2021-10-01 NOTE — Progress Notes (Signed)
Established Patient Office Visit  Subjective    Patient ID: Curtis Williamson, male    DOB: 1960-08-04  Age: 61 y.o. MRN: [id number]  CC:  Chief Complaint  Patient presents with   Diabetes    HPI Curtis Williamson presents for review of chronic med issues including diabetes and hypertension. Patient reports that he has been feeling better but that his blood pressure has been having episodes intermittently of extreme lower blood pressure. He has been taking his meds on a prn basis.    Outpatient Encounter Medications as of 10/01/2021  Medication Sig   Accu-Chek Softclix Lancets lancets Use as instructed   acetaminophen (TYLENOL) 500 MG tablet Take 1 tablet (500 mg total) by mouth every 6 (six) hours as needed for fever or moderate pain.   atorvastatin (LIPITOR) 10 MG tablet TAKE 1 TABLET (10 MG TOTAL) BY MOUTH DAILY.   Blood Glucose Monitoring Suppl (ACCU-CHEK GUIDE ME) w/Device KIT 1 each by Does not apply route 2 (two) times daily. Use to check FSBS twice a day. Dx: E11.9   empagliflozin (JARDIANCE) 25 MG TABS tablet Take 1 tablet (25 mg total) by mouth daily before breakfast.   fluticasone (FLONASE) 50 MCG/ACT nasal spray SPRAY 2 SPRAYS INTO EACH NOSTRIL EVERY DAY (Patient taking differently: Place 2 sprays into both nostrils daily.)   gabapentin (NEURONTIN) 300 MG capsule Take 1 capsule (300 mg total) by mouth 3 (three) times daily.   glucose blood (ACCU-CHEK GUIDE) test strip Use as instructed   hydrOXYzine (ATARAX/VISTARIL) 25 MG tablet Take 1 tablet (25 mg total) by mouth every 6 (six) hours.   latanoprost (XALATAN) 0.005 % ophthalmic solution SMARTSIG:1 Drop(s) In Eye(s) Every Evening   lisinopril-hydrochlorothiazide (ZESTORETIC) 10-12.5 MG tablet Take 1 tablet by mouth daily.   meloxicam (MOBIC) 15 MG tablet TAKE 1 TABLET (15 MG TOTAL) BY MOUTH DAILY.   metFORMIN (GLUCOPHAGE) 500 MG tablet Take 1 tablet (500 mg total) by mouth 2 (two) times daily with a meal.   ondansetron  (ZOFRAN ODT) 4 MG disintegrating tablet Take 1 tablet (4 mg total) by mouth every 8 (eight) hours as needed for nausea or vomiting.   traZODone (DESYREL) 50 MG tablet TAKE 0.5-1 TABLETS BY MOUTH AT BEDTIME AS NEEDED FOR SLEEP.   triamcinolone cream (KENALOG) 0.1 % Apply 1 application topically 2 (two) times daily.   acetaminophen-codeine (TYLENOL #3) 300-30 MG tablet Take 1 tablet by mouth every 12 (twelve) hours as needed for moderate pain. (Patient not taking: Reported on 09/02/2021)   chlorzoxazone (PARAFON) 500 MG tablet TAKE 1 TABLET BY MOUTH THREE TIMES A DAY AS NEEDED FOR MUSCLE SPASMS (Patient not taking: Reported on 09/02/2021)   No facility-administered encounter medications on file as of 10/01/2021.    Past Medical History:  Diagnosis Date   Chronic kidney disease    only has one kidney   Diabetes mellitus without complication ([place])    Essential hypertension    Hyperlipidemia    Hypertension    Phreesia 01/13/2020    Past Surgical History:  Procedure Laterality Date   KIDNEY DONATION     MANDIBLE FRACTURE SURGERY     ROTATOR CUFF REPAIR Right     Family History  Problem Relation Age of Onset   Hypertension Mother    Kidney failure Mother    Kidney disease Mother    Heart disease Mother    ADD / ADHD Daughter    Anxiety disorder Daughter    Alcohol abuse Father    Colon  cancer Neg Hx    Stomach cancer Neg Hx    Esophageal cancer Neg Hx    Colon polyps Neg Hx     Social History   Socioeconomic History   Marital status: Single    Spouse name: Not on file   Number of children: 1   Years of education: Not on file   Highest education level: Some college, no degree  Occupational History   Occupation: Truck driver  Tobacco Use   Smoking status: Every Day    Packs/day: 0.50    Years: 30.00    Total pack years: 15.00    Types: Cigarettes   Smokeless tobacco: Never   Tobacco comments:    off and on, has tried to quit  Vaping Use   Vaping Use: Never used   Substance and Sexual Activity   Alcohol use: Yes    Comment: rarely   Drug use: Never   Sexual activity: Not Currently  Other Topics Concern   Not on file  Social History Narrative   Right handed   One story home   No caffeine    Social Determinants of Health   Financial Resource Strain: Not on file  Food Insecurity: Not on file  Transportation Needs: Not on file  Physical Activity: Not on file  Stress: Not on file  Social Connections: Not on file  Intimate Partner Violence: Not on file    Review of Systems  All other systems reviewed and are negative.       Objective    BP 127/86   Pulse 73   Temp 98.1 F (36.7 C) (Oral)   Resp 16   Ht 5' 11"  (1.803 m)   Wt 193 lb (87.5 kg)   BMI 26.92 kg/m   Physical Exam Vitals and nursing note reviewed.  Constitutional:      General: He is not in acute distress. Cardiovascular:     Rate and Rhythm: Normal rate and regular rhythm.  Pulmonary:     Effort: Pulmonary effort is normal.     Breath sounds: Normal breath sounds.  Abdominal:     Palpations: Abdomen is soft.     Tenderness: There is no abdominal tenderness.  Musculoskeletal:     Right lower leg: No edema.     Left lower leg: No edema.  Neurological:     General: No focal deficit present.     Mental Status: He is alert and oriented to person, place, and time.         Assessment & Plan:   1. Type 2 diabetes mellitus with hyperglycemia, without long-term current use of insulin (HCC) Appears to be improving RBS with present management. Continue and monitor  2. Essential hypertension Discussed compliance. Secondary to patient's stated sx will change bp meds from zestoretic  10-12.5 to lisinopril 10 mg and monitor  3. Hyperlipidemia associated with type 2 diabetes mellitus ([place]) Continue present management    No follow-ups on file.   Becky Sax, MD

## 2021-10-05 ENCOUNTER — Emergency Department (HOSPITAL_COMMUNITY)
Admission: EM | Admit: 2021-10-05 | Discharge: 2021-10-06 | Disposition: A | Discharge status: Home / Self Care | Payer: Medicaid Other | Attending: Student | Admitting: Student

## 2021-10-05 ENCOUNTER — Emergency Department (HOSPITAL_COMMUNITY): Payer: Medicaid Other

## 2021-10-05 ENCOUNTER — Other Ambulatory Visit: Payer: Self-pay

## 2021-10-05 ENCOUNTER — Encounter (HOSPITAL_COMMUNITY): Payer: Self-pay

## 2021-10-05 DIAGNOSIS — M79675 Pain in left toe(s): Secondary | ICD-10-CM | POA: Diagnosis not present

## 2021-10-05 DIAGNOSIS — Z7984 Long term (current) use of oral hypoglycemic drugs: Secondary | ICD-10-CM | POA: Diagnosis not present

## 2021-10-05 DIAGNOSIS — I10 Essential (primary) hypertension: Secondary | ICD-10-CM | POA: Insufficient documentation

## 2021-10-05 DIAGNOSIS — Z79899 Other long term (current) drug therapy: Secondary | ICD-10-CM | POA: Diagnosis not present

## 2021-10-05 DIAGNOSIS — X509XXA Other and unspecified overexertion or strenuous movements or postures, initial encounter: Secondary | ICD-10-CM | POA: Insufficient documentation

## 2021-10-05 DIAGNOSIS — E119 Type 2 diabetes mellitus without complications: Secondary | ICD-10-CM | POA: Diagnosis not present

## 2021-10-05 DIAGNOSIS — S99922A Unspecified injury of left foot, initial encounter: Secondary | ICD-10-CM | POA: Insufficient documentation

## 2021-10-05 IMAGING — CR DG FOOT COMPLETE 3+V*L*
3 series · 3 of 3 positions shown · non-contrast
Comparison: None Available.

CLINICAL DATA: Fall with toe pain

EXAM:
LEFT FOOT - COMPLETE 3+ VIEW

[foot ap]
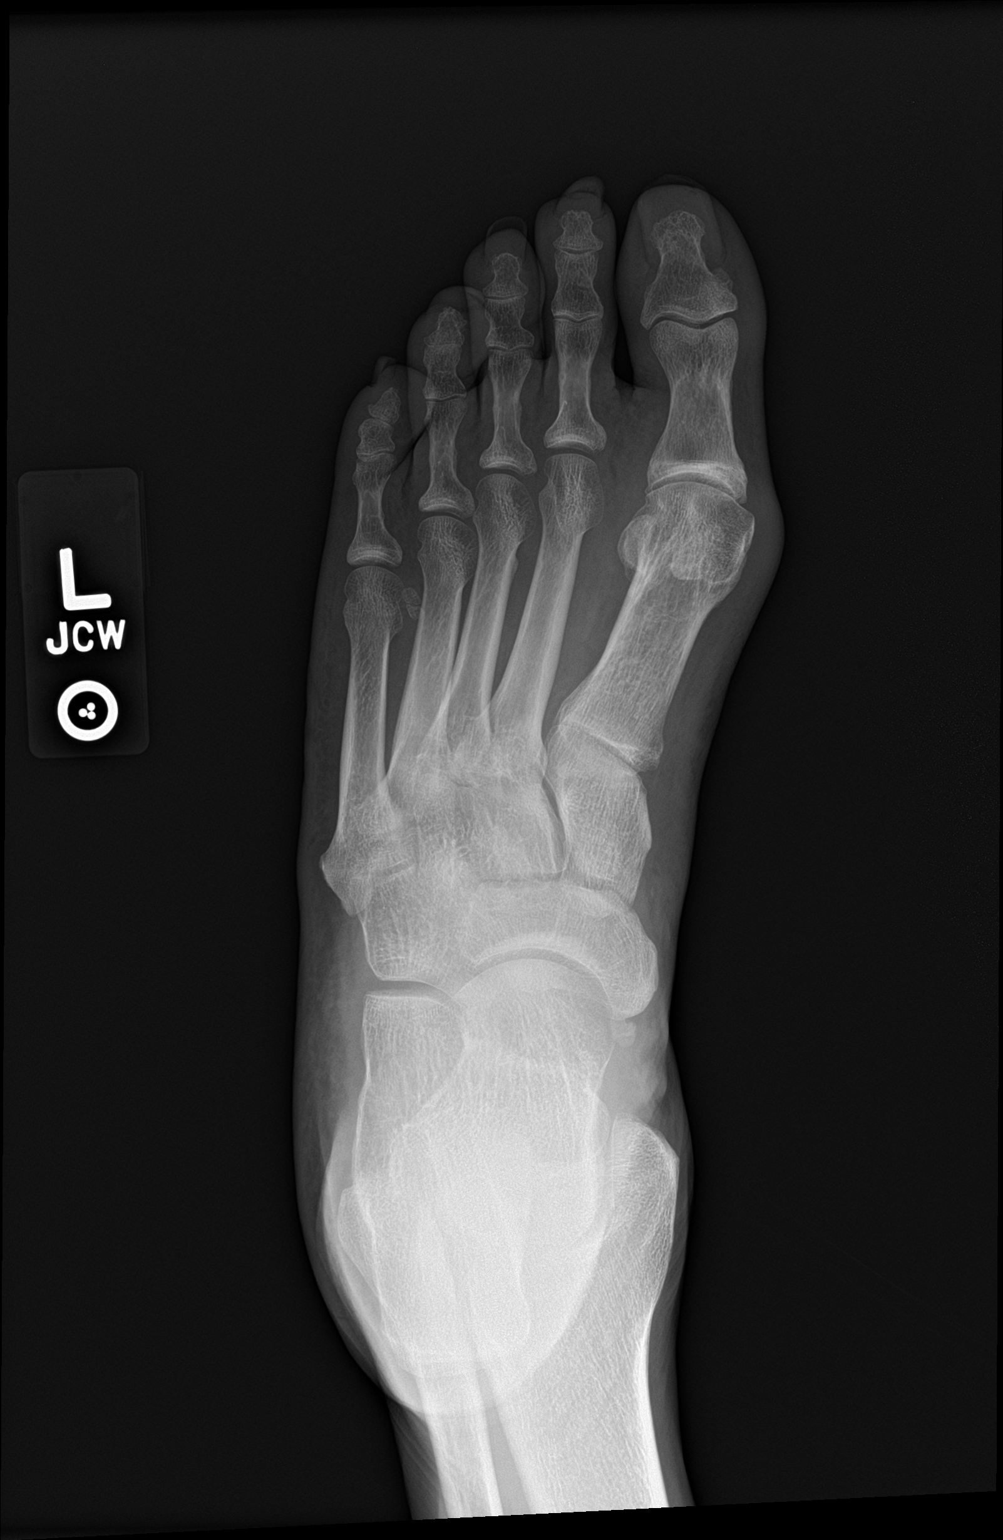

[foot obl]
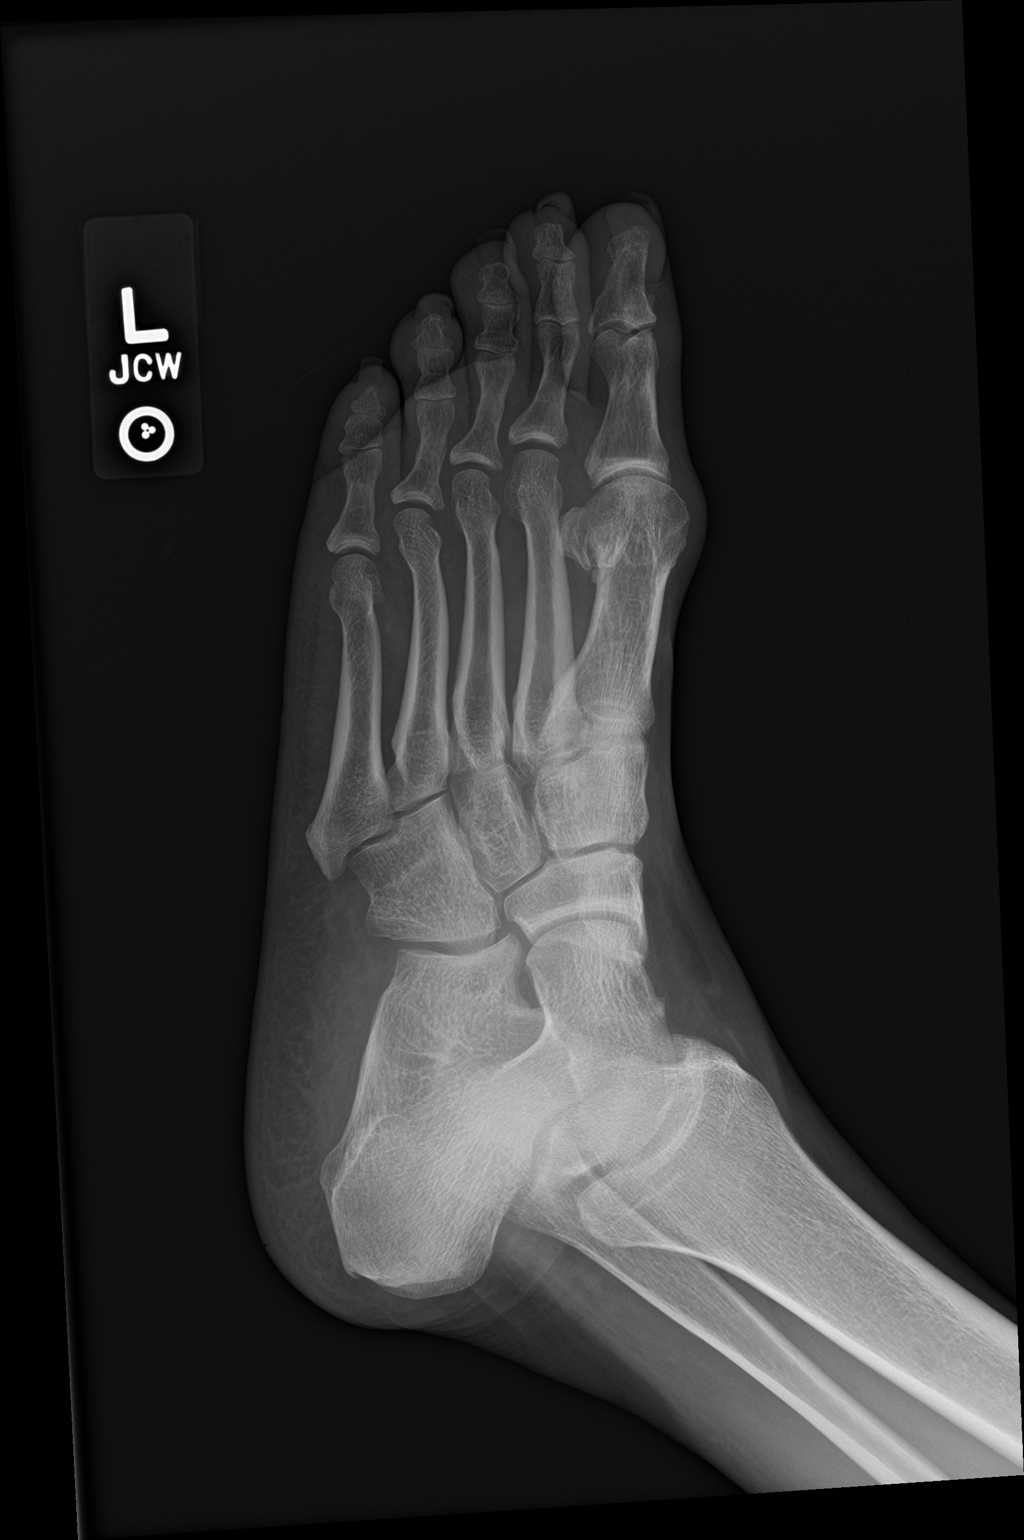

[foot lat]
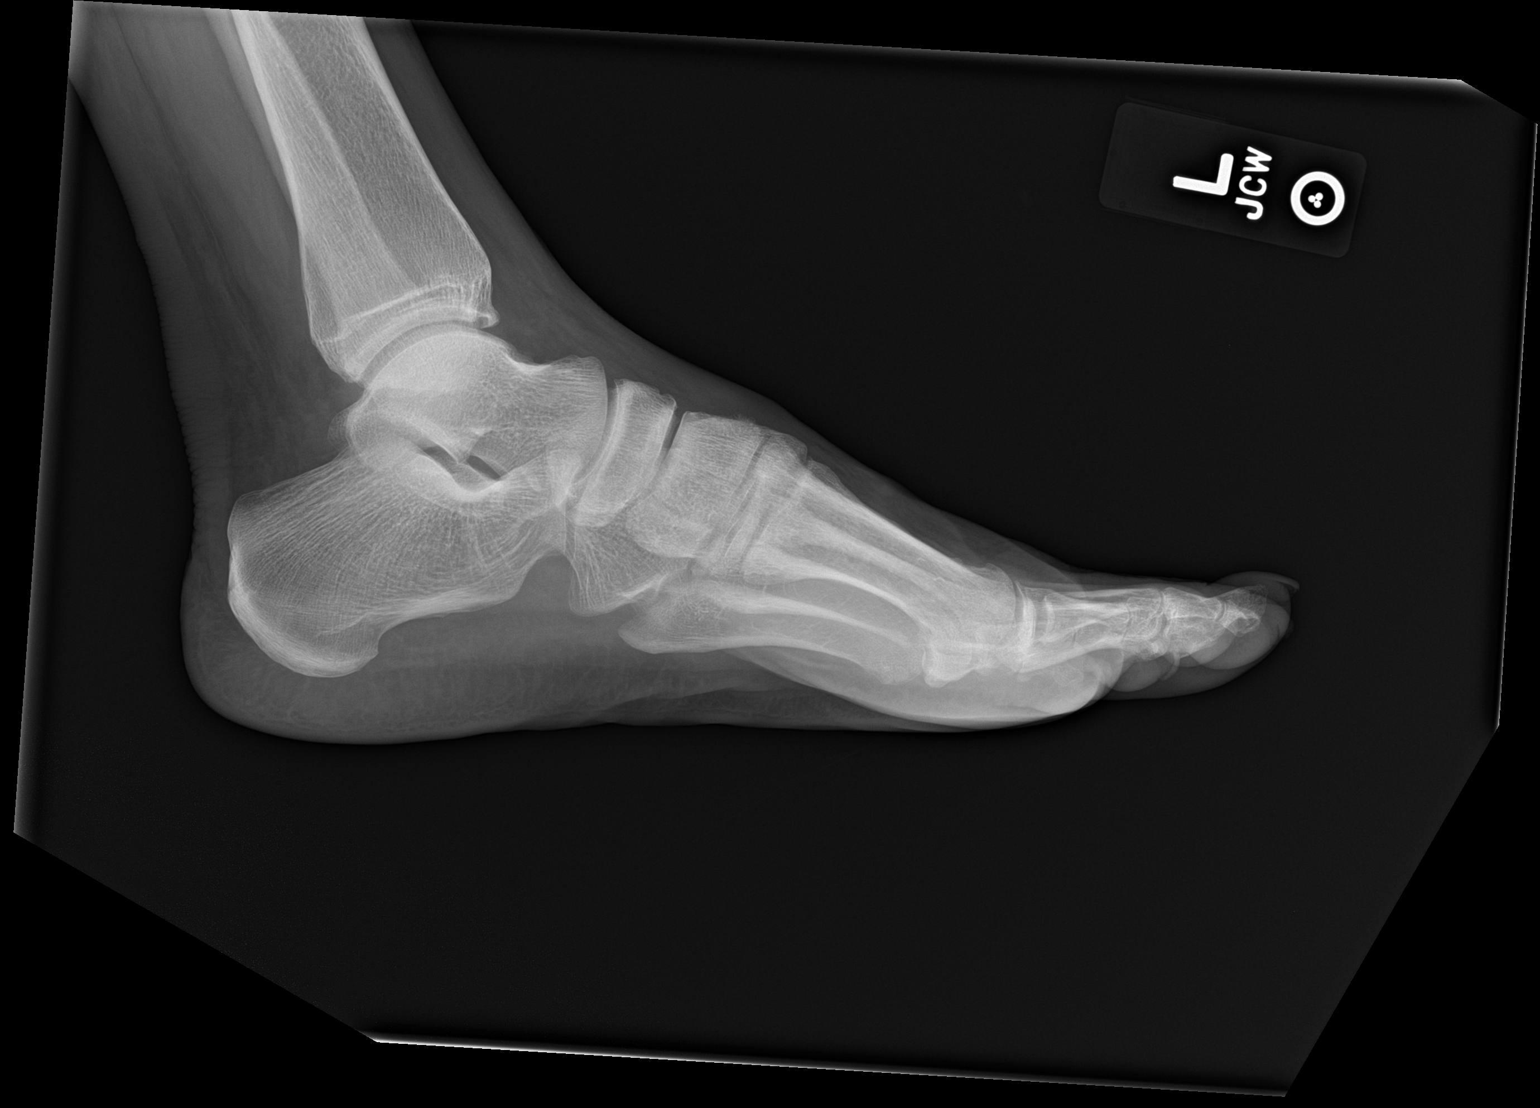

[3 of 3 positions shown; findings below may reference images not displayed]

FINDINGS: No fracture or malalignment. Mild degenerative change at the first
MTP joint.
IMPRESSION: No acute osseous abnormality

## 2021-10-05 NOTE — ED Provider Triage Note (Signed)
Emergency Medicine Provider Triage Evaluation Note  Curtis Williamson , a 61 y.o. male  was evaluated in triage.  Pt complains of left foot pain S/p injury. Great toe bent backwards. Pain worse with movement.  Review of Systems  Positive: arthralgia Negative: wound  Physical Exam  BP (!) 129/96 (BP Location: Left Arm)   Pulse 85   Temp 98.3 F (36.8 C) (Oral)   Resp 16   SpO2 94%  Gen:   Awake, no distress   Resp:  Normal effort  MSK:   TTP to the left 1st digit/MTP and the medial forefoot/midfoot.   Medical Decision Making  Medically screening exam initiated at 11:35 PM.  Appropriate orders placed.  Yazid Pop was informed that the remainder of the evaluation will be completed by another provider, this initial triage assessment does not replace that evaluation, and the importance of remaining in the ED until their evaluation is complete.  Toe injury.    Cherly Anderson, [place] 10/05/21 2336

## 2021-10-05 NOTE — ED Triage Notes (Signed)
Pt reports he tripped today and his left big toe bent backward and all of his weight fell onto it. He reports pain, swelling and bruising

## 2021-10-06 DIAGNOSIS — M79675 Pain in left toe(s): Secondary | ICD-10-CM | POA: Diagnosis not present

## 2021-10-06 MED ORDER — ACETAMINOPHEN 325 MG PO TABS
650.0000 mg | ORAL_TABLET | Freq: Once | ORAL | Status: AC
  Administered 2021-10-06: 650 mg via ORAL
  Filled 2021-10-06: qty 2

## 2021-10-06 MED ORDER — NAPROXEN 250 MG PO TABS
500.0000 mg | ORAL_TABLET | Freq: Once | ORAL | Status: DC
  Filled 2021-10-06: qty 2

## 2021-10-06 MED ORDER — DICLOFENAC SODIUM 1 % EX GEL: 4.0000 g | Freq: Four times a day (QID) | CUTANEOUS | 0 refills | Status: DC | PRN

## 2021-10-06 NOTE — Discharge Instructions (Addendum)
Please read and follow all provided instructions.  You have been seen today for an injury to your left foot/toe.  Tests performed today include: An x-ray of the affected area - does NOT show any broken bones or dislocations.  Vital signs. See below for your results today.   Home care instructions: -- *PRICE in the first 24-48 hours after injury: Protect (with brace, splint, sling), if given by your provider Rest Ice- Do not apply ice pack directly to your skin, place towel or similar between your skin and ice/ice pack. Apply ice for 20 min, then remove for 40 min while awake Compression- Wear brace, elastic bandage, splint as directed by your provider Elevate affected extremity above the level of your heart when not walking around for the first 24-48 hours   Medications:  Diclofenac gel-this is a topical pain medication to help with pain and swelling, please apply this to your foot in the affected area of pain 4 times per day as needed.  You make take Tylenol per over the counter dosing with these medications.   We have prescribed you new medication(s) today. Discuss the medications prescribed today with your pharmacist as they can have adverse effects and interactions with your other medicines including over the counter and prescribed medications. Seek medical evaluation if you start to experience new or abnormal symptoms after taking one of these medicines, seek care immediately if you start to experience difficulty breathing, feeling of your throat closing, facial swelling, or rash as these could be indications of a more serious allergic reaction   Follow-up instructions: Please follow-up with your primary care provider or the provided orthopedic physician (bone specialist) if you continue to have significant pain in 1 week. In this case you may have a more severe injury that requires further care.   Return instructions:  Please return if your digits or extremity are numb or tingling,  appear gray or blue, or you have severe pain (also elevate the extremity and loosen splint or wrap if you were given one) Please return if you have redness or fevers.  Please return to the Emergency Department if you experience worsening symptoms.  Please return if you have any other emergent concerns. Additional Information:  Your vital signs today were: BP (!) 129/96 (BP Location: Left Arm)   Pulse 85   Temp 98.3 F (36.8 C) (Oral)   Resp 16   Ht 5\' 11"  (1.803 m)   Wt 87.5 kg   SpO2 94%   BMI 26.92 kg/m  If your blood pressure (BP) was elevated above 135/85 this visit, please have this repeated by your doctor within one month. ---------------

## 2021-10-06 NOTE — ED Provider Notes (Signed)
[facility] EMERGENCY DEPARTMENT Provider Note   CSN: [id number] Arrival date & time: 10/05/21  2155     History  Chief Complaint  Patient presents with   Toe Pain    Curtis Williamson is a 61 y.o. male with a hx of DM, HTN, hyperlipidemia and prior kidney donation who presents to the ED with complaints of left great toe pain S/p injury shortly PTA.  Patient reports that he slipped and his left great toe bent underneath his foot causing pain.  Reports associated swelling.  Denies open wounds or other areas of injury.  HPI     Home Medications Prior to Admission medications   Medication Sig Start Date End Date Taking? Authorizing Provider  Accu-Chek Softclix Lancets lancets Use as instructed 09/22/20   Mayers, Cari S, PA-C  acetaminophen (TYLENOL) 500 MG tablet Take 1 tablet (500 mg total) by mouth every 6 (six) hours as needed for fever or moderate pain. 01/20/21   Tedd Sias, PA  acetaminophen-codeine (TYLENOL #3) 300-30 MG tablet Take 1 tablet by mouth every 12 (twelve) hours as needed for moderate pain. Patient not taking: Reported on 09/02/2021 09/09/20   Meredith Pel, MD  atorvastatin (LIPITOR) 10 MG tablet TAKE 1 TABLET (10 MG TOTAL) BY MOUTH DAILY. 01/18/21   Dorna Mai, MD  Blood Glucose Monitoring Suppl (ACCU-CHEK GUIDE ME) w/Device KIT 1 each by Does not apply route 2 (two) times daily. Use to check FSBS twice a day. Dx: E11.9 09/22/20   Mayers, Cari S, PA-C  chlorzoxazone (PARAFON) 500 MG tablet TAKE 1 TABLET BY MOUTH THREE TIMES A DAY AS NEEDED FOR MUSCLE SPASMS Patient not taking: Reported on 09/02/2021 10/21/20   Hilts, Legrand Como, MD  empagliflozin (JARDIANCE) 25 MG TABS tablet Take 1 tablet (25 mg total) by mouth daily before breakfast. 09/02/21   Mayers, Cari S, PA-C  fluticasone (FLONASE) 50 MCG/ACT nasal spray SPRAY 2 SPRAYS INTO EACH NOSTRIL EVERY DAY Patient taking differently: Place 2 sprays into both nostrils daily. 01/18/21   Dorna Mai, MD  gabapentin (NEURONTIN) 300 MG capsule Take 1 capsule (300 mg total) by mouth 3 (three) times daily. 10/01/21 10/01/22  Dorna Mai, MD  glucose blood (ACCU-CHEK GUIDE) test strip Use as instructed 09/22/20   Mayers, Cari S, PA-C  hydrOXYzine (ATARAX/VISTARIL) 25 MG tablet Take 1 tablet (25 mg total) by mouth every 6 (six) hours. 11/19/20   Mayers, Cari S, PA-C  latanoprost (XALATAN) 0.005 % ophthalmic solution SMARTSIG:1 Drop(s) In Eye(s) Every Evening 08/28/21   [provider]  lisinopril (ZESTRIL) 10 MG tablet Take 1 tablet (10 mg total) by mouth daily. 10/01/21   Dorna Mai, MD  meloxicam (MOBIC) 15 MG tablet TAKE 1 TABLET (15 MG TOTAL) BY MOUTH DAILY. 01/21/21   Magnant, Charles L, PA-C  metFORMIN (GLUCOPHAGE) 500 MG tablet Take 1 tablet (500 mg total) by mouth 2 (two) times daily with a meal. 09/02/21   Mayers, Cari S, PA-C  ondansetron (ZOFRAN ODT) 4 MG disintegrating tablet Take 1 tablet (4 mg total) by mouth every 8 (eight) hours as needed for nausea or vomiting. 01/20/21   Tedd Sias, PA  traZODone (DESYREL) 50 MG tablet TAKE 0.5-1 TABLETS BY MOUTH AT BEDTIME AS NEEDED FOR SLEEP. 04/28/21   Dorna Mai, MD  triamcinolone cream (KENALOG) 0.1 % Apply 1 application topically 2 (two) times daily. 07/11/19   Nicolette Bang, MD      Allergies    Mushroom extract complex  Review of Systems   Review of Systems  Constitutional:  Negative for chills and fever.  Musculoskeletal:  Positive for arthralgias and joint swelling.  Skin:  Negative for wound.  Neurological:  Negative for weakness and numbness.  All other systems reviewed and are negative.   Physical Exam Updated Vital Signs BP (!) 129/96 (BP Location: Left Arm)   Pulse 85   Temp 98.3 F (36.8 C) (Oral)   Resp 16   Ht 5' 11" (1.803 m)   Wt 87.5 kg   SpO2 94%   BMI 26.92 kg/m  Physical Exam Vitals and nursing note reviewed.  Constitutional:      General: He is not in acute distress.     Appearance: He is not ill-appearing or toxic-appearing.  HENT:     Head: Normocephalic and atraumatic.  Cardiovascular:     Pulses:          Dorsalis pedis pulses are 2+ on the right side and 2+ on the left side.       Posterior tibial pulses are 2+ on the right side and 2+ on the left side.  Pulmonary:     Effort: Pulmonary effort is normal.  Musculoskeletal:     Comments: Lower extremities: Mild swelling over the left first MTP joint.  No significant open wounds.  Patient able to move all digits, normal range of motion to the ankle and knee.  Tender palpation to the left great toe, first MTP, as well as the medial aspect of the mid and forefoot.  Otherwise nontender.  Specifically no malleolar tenderness.   Skin:    General: Skin is warm and dry.     Capillary Refill: Capillary refill takes less than 2 seconds.  Neurological:     Mental Status: He is alert.     Comments: Alert. Clear speech. Sensation grossly intact to bilateral lower extremities. 5/5 strength with plantar/dorsiflexion bilaterally.   Psychiatric:        Mood and Affect: Mood normal.        Behavior: Behavior normal.     ED Results / Procedures / Treatments   Labs (all labs ordered are listed, but only abnormal results are displayed) Labs Reviewed - No data to display  EKG None  Radiology DG Foot Complete Left  Result Date: 10/06/2021 CLINICAL DATA:  Fall with toe pain EXAM: LEFT FOOT - COMPLETE 3+ VIEW COMPARISON:  None Available. FINDINGS: No fracture or malalignment. Mild degenerative change at the first MTP joint. IMPRESSION: No acute osseous abnormality Electronically Signed   By: Donavan Foil M.D.   On: 10/06/2021 00:07    Procedures Procedures    Medications Ordered in ED Medications - No data to display  ED Course/ Medical Decision Making/ A&P                           Medical Decision Making Amount and/or Complexity of Data Reviewed Radiology: ordered.  Risk OTC drugs.   Patient  presents to the emergency department with complaints of left foot/toe injury.  Nontoxic, blood pressure with mildly elevated diastolic BP, doubt hypertensive emergency.  I viewed most recent labs on record, creatinine from May 2023 is within normal limits.  I ordered, viewed, and interpreted imaging including left foot x-ray, agree with radiologist: No acute osseous abnormality.  X-ray without fracture or dislocation.  No significant overlying wounds.  Neurovascular intact distally.  Plan to buddy tape toes, provide postop shoe, and analgesics. PRICE,  orthopedics follow up. I discussed results, treatment plan, need for follow-up, and return precautions with the patient. Provided opportunity for questions, patient confirmed understanding and is in agreement with plan.    Final Clinical Impression(s) / ED Diagnoses Final diagnoses:  Injury of toe on left foot, initial encounter    Rx / DC Orders ED Discharge Orders          Ordered    diclofenac Sodium (VOLTAREN) 1 % GEL  4 times daily PRN        10/06/21 0421              Petrucelli, Nelagoney R, PA-C 10/06/21 0535    Orpah Greek, MD 10/06/21 [phone]

## 2021-10-25 ENCOUNTER — Other Ambulatory Visit: Payer: Self-pay | Admitting: Family Medicine

## 2021-10-25 ENCOUNTER — Other Ambulatory Visit: Payer: Self-pay | Admitting: Physician Assistant

## 2021-10-25 DIAGNOSIS — E1165 Type 2 diabetes mellitus with hyperglycemia: Secondary | ICD-10-CM

## 2021-10-28 DIAGNOSIS — E119 Type 2 diabetes mellitus without complications: Secondary | ICD-10-CM | POA: Diagnosis not present

## 2021-11-01 ENCOUNTER — Ambulatory Visit
Admission: RE | Admit: 2021-11-01 | Discharge: 2021-11-01 | Disposition: A | Discharge status: Home / Self Care | Payer: Medicaid Other | Source: Ambulatory Visit | Attending: Neurology | Admitting: Neurology

## 2021-11-01 DIAGNOSIS — Z8679 Personal history of other diseases of the circulatory system: Secondary | ICD-10-CM | POA: Diagnosis not present

## 2021-11-01 DIAGNOSIS — Q283 Other malformations of cerebral vessels: Secondary | ICD-10-CM | POA: Diagnosis not present

## 2021-11-01 DIAGNOSIS — I671 Cerebral aneurysm, nonruptured: Secondary | ICD-10-CM

## 2021-11-04 NOTE — Progress Notes (Signed)
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   UPDATE: MRA of head on 12/03/2020 personally reviewed was stable - 1-2 mm inferomedially projecting outpouching from the distal left ICA felt to be an infundibulum, as well as the 2 mm medially projecting outpouching arising from the left ACA near the A1/A2 junction which can be either a tiny aneurysm or infundibulum.  Repeat MRA of head on 11/01/2021 personally reviewed was unchanged.    He was having dizziness.  Diabetes had been uncontrolled.  He had adjustments to his medication and he is now feeling well.     HISTORY: He moved to [place] from [place] in September 2020.  Around August, he started having headaches.  At first they were mild and infrequent, but gradually became more intense and frequent.  He presented to [facility] ED on 02/26/2019 for 3 days of intermittent dizziness and lightheadedness as well as severe pulsating left temporal and retro-orbital headache.  No associated slurred speech, facial droop or unilateral numbness or weakness.  He had been out of his Metformin for 3 days.  CTA of head and neck performed was personally reviewed and showed a 2 mm aneurysm vs infundibulum of the supraclinoid intracranial left ICA but no acute abnormality or large vessel high-grade stenosis, occlusion or dissection.  He was advised to follow up with his PCP regarding the CT finding. He had a repeat head CT on 04/02/2019 which showed no acute intracranial abnormality.  Headaches started to dissipate by end of December.  He reports decreased emotional stress.  Subsequently, headaches have been mild,  infrequent and lasting up to 30 minutes.     Repeat MRA of head on 11/03/2019 personally reviewed demonstrated stable 1.5 mm left supraclinoid ICA infundibulum versus aneurysm.   He is not aware of any family history of aneurysms.   Of note, when he was still in [place], he had a couple of episodes of syncope.  They occurred during work rehabilitation (he had been out of work for a year due to shoulder surgery.  PAST MEDICAL HISTORY: Past Medical History:  Diagnosis Date   Chronic kidney disease    only has one kidney   Diabetes mellitus without complication ([place])    Essential hypertension    Hyperlipidemia    Hypertension    Phreesia 01/13/2020    MEDICATIONS: Current Outpatient Medications on File Prior to Visit  Medication Sig Dispense Refill   Accu-Chek Softclix Lancets lancets Use as instructed 100 each 12   acetaminophen (TYLENOL) 500 MG tablet Take 1 tablet (500 mg total) by mouth every 6 (six) hours as needed for fever or moderate pain. 30 tablet 0   atorvastatin (LIPITOR) 10 MG tablet TAKE 1 TABLET (10 MG TOTAL) BY MOUTH DAILY. 90 tablet 1   Blood Glucose Monitoring Suppl (ACCU-CHEK GUIDE ME) w/Device KIT 1 each by Does not apply route 2 (two) times daily. Use to check FSBS twice a day. Dx: E11.9 1 kit 0   diclofenac Sodium (VOLTAREN) 1 % GEL Apply 4 g topically 4 (four) times daily as needed (pain/swelling). 50 g 0   fluticasone (FLONASE) 50 MCG/ACT nasal spray SPRAY 2 SPRAYS INTO [facility]  NOSTRIL EVERY DAY (Patient taking differently: Place 2 sprays into both nostrils daily.) 16 mL 0   gabapentin (NEURONTIN) 300 MG capsule Take 1 capsule (300 mg total) by mouth 3 (three) times daily. 90 capsule 1   glucose blood (ACCU-CHEK GUIDE) test strip Use as instructed 50 strip 7   hydrOXYzine (ATARAX/VISTARIL) 25 MG tablet Take 1 tablet (25 mg total) by mouth every 6 (six) hours. 180 tablet 1   JARDIANCE 25 MG TABS tablet TAKE 1 TABLET BY MOUTH DAILY BEFORE BREAKFAST. 30 tablet 1    latanoprost (XALATAN) 0.005 % ophthalmic solution SMARTSIG:1 Drop(s) In Eye(s) Every Evening     lisinopril (ZESTRIL) 10 MG tablet TAKE 1 TABLET BY MOUTH EVERY DAY 90 tablet 0   meloxicam (MOBIC) 15 MG tablet TAKE 1 TABLET (15 MG TOTAL) BY MOUTH DAILY. 30 tablet 0   metFORMIN (GLUCOPHAGE) 500 MG tablet Take 1 tablet (500 mg total) by mouth 2 (two) times daily with a meal. 60 tablet 1   ondansetron (ZOFRAN ODT) 4 MG disintegrating tablet Take 1 tablet (4 mg total) by mouth every 8 (eight) hours as needed for nausea or vomiting. 20 tablet 0   traZODone (DESYREL) 50 MG tablet TAKE 0.5-1 TABLETS BY MOUTH AT BEDTIME AS NEEDED FOR SLEEP. 90 tablet 0   triamcinolone cream (KENALOG) 0.1 % Apply 1 application topically 2 (two) times daily. 30 g 2   No current facility-administered medications on file prior to visit.    ALLERGIES: Allergies  Allergen Reactions   Mushroom Extract Complex     FAMILY HISTORY: Family History  Problem Relation Age of Onset   Hypertension Mother    Kidney failure Mother    Kidney disease Mother    Heart disease Mother    ADD / ADHD Daughter    Anxiety disorder Daughter    Alcohol abuse Father    Colon cancer Neg Hx    Stomach cancer Neg Hx    Esophageal cancer Neg Hx    Colon polyps Neg Hx       Objective:  Blood pressure 119/83, pulse 97, height _0  (1.803 m), weight 193 lb (87.5 kg), SpO2 94 %. General: No acute distress.  Patient appears well-groomed.   Head:  Normocephalic/atraumatic Eyes:  Fundi examined but not visualized Neck: supple, no paraspinal tenderness, full range of motion Heart:  Regular rate and rhythm Neurological Exam: alert and oriented to person, place, and time.  Speech fluent and not dysarthric, language intact.  CN II-XII intact. Bulk and tone normal, muscle strength 5/5 throughout.  Sensation to light touch intact.  Deep tendon reflexes 2+ throughout, toes downgoing.  Finger to nose testing intact.  Gait normal, Romberg  negative.   Metta Clines, DO  CC: Dorna Mai, MD

## 2021-11-08 ENCOUNTER — Encounter: Payer: Self-pay | Admitting: Neurology

## 2021-11-08 ENCOUNTER — Ambulatory Visit: Payer: Medicaid Other | Admitting: Neurology

## 2021-11-08 VITALS — BP 119/83 | HR 97 | Ht 71.0 in | Wt 193.0 lb

## 2021-11-08 DIAGNOSIS — I671 Cerebral aneurysm, nonruptured: Secondary | ICD-10-CM

## 2021-11-08 NOTE — Patient Instructions (Signed)
I want to schedule for repeat MRA of head in early July 2026 and I want you to follow up with me after the repeat MRA but before November 07 2024.

## 2021-11-12 ENCOUNTER — Telehealth: Payer: Self-pay | Admitting: Orthopedic Surgery

## 2021-11-12 NOTE — Telephone Encounter (Signed)
Patient called. He would like Dr. [name] to renew his handicap plaqued. His call back number is [phone]

## 2021-11-15 ENCOUNTER — Telehealth: Payer: Self-pay | Admitting: Orthopedic Surgery

## 2021-11-15 NOTE — Telephone Encounter (Signed)
See other note. Waiting for response from Dr [name]

## 2021-11-15 NOTE — Telephone Encounter (Signed)
The main requirement from an orthopedic standpoint for handicap placard is not being able to walk more than 200 feet without stopping to rest due to orthopedic condition.  We operated on his shoulder.  I will think we have never really dealt with any of his lower extremities so at this point I cannot really write that since he is out of the postop period for his shoulder.  Unless there is something about his lower extremities or back that I do not know about

## 2021-11-15 NOTE — Telephone Encounter (Signed)
Pt called for a newel of handicap placard. Please call pt when ready for pick up. Pt phone number is [phone]

## 2021-11-16 NOTE — Telephone Encounter (Signed)
IC advised per Dr Dean 

## 2021-11-17 ENCOUNTER — Ambulatory Visit: Payer: Self-pay

## 2021-11-17 ENCOUNTER — Encounter: Payer: Self-pay | Admitting: Surgery

## 2021-11-17 ENCOUNTER — Ambulatory Visit: Payer: Medicaid Other | Admitting: Surgery

## 2021-11-17 VITALS — BP 123/85 | HR 88 | Ht 71.0 in | Wt 193.0 lb

## 2021-11-17 DIAGNOSIS — M545 Low back pain, unspecified: Secondary | ICD-10-CM | POA: Diagnosis not present

## 2021-11-17 DIAGNOSIS — G8929 Other chronic pain: Secondary | ICD-10-CM
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
## 2021-11-18 ENCOUNTER — Other Ambulatory Visit: Payer: Self-pay | Admitting: Physician Assistant

## 2021-11-18 DIAGNOSIS — E1165 Type 2 diabetes mellitus with hyperglycemia: Secondary | ICD-10-CM

## 2021-11-18 NOTE — Telephone Encounter (Signed)
Order complete. 

## 2021-11-26 ENCOUNTER — Ambulatory Visit
Admission: RE | Admit: 2021-11-26 | Discharge: 2021-11-26 | Disposition: A | Discharge status: Home / Self Care | Payer: Medicaid Other | Source: Ambulatory Visit | Attending: Surgery | Admitting: Surgery

## 2021-11-26 DIAGNOSIS — M48061 Spinal stenosis, lumbar region without neurogenic claudication: Secondary | ICD-10-CM | POA: Diagnosis not present

## 2021-11-26 DIAGNOSIS — M545 Low back pain, unspecified: Secondary | ICD-10-CM | POA: Diagnosis not present

## 2021-11-26 DIAGNOSIS — M4316 Spondylolisthesis, lumbar region: Secondary | ICD-10-CM | POA: Diagnosis not present

## 2021-11-26 DIAGNOSIS — G8929 Other chronic pain: Secondary | ICD-10-CM

## 2021-11-29 DIAGNOSIS — E119 Type 2 diabetes mellitus without complications: Secondary | ICD-10-CM | POA: Diagnosis not present

## 2021-12-13 DIAGNOSIS — M47817 Spondylosis without myelopathy or radiculopathy, lumbosacral region: Secondary | ICD-10-CM | POA: Insufficient documentation

## 2021-12-15 ENCOUNTER — Telehealth: Payer: Self-pay | Admitting: Radiology

## 2021-12-15 ENCOUNTER — Encounter: Payer: Self-pay | Admitting: Orthopaedic Surgery

## 2021-12-15 ENCOUNTER — Ambulatory Visit (INDEPENDENT_AMBULATORY_CARE_PROVIDER_SITE_OTHER): Payer: Worker's Compensation | Admitting: Orthopaedic Surgery

## 2021-12-15 VITALS — BP 116/79 | HR 86 | Ht 71.0 in | Wt 193.0 lb

## 2021-12-15 DIAGNOSIS — M545 Low back pain, unspecified: Secondary | ICD-10-CM | POA: Diagnosis not present

## 2021-12-15 NOTE — Telephone Encounter (Signed)
Patient came in to office today to review MRI lumbar spine. Dr Ophelia Charter needs FCE results from FCE he ordered a year ago. Patient also had second opinion at [facility] this week through Work Comp. He needs to have those notes. He has asked patient to make follow up appointment next week. Could you please reach out to Work Comp and see if they will send you his records?
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
## 2021-12-18 ENCOUNTER — Other Ambulatory Visit: Payer: Self-pay | Admitting: Family

## 2021-12-18 DIAGNOSIS — E1165 Type 2 diabetes mellitus with hyperglycemia: Secondary | ICD-10-CM

## 2021-12-21 ENCOUNTER — Ambulatory Visit (INDEPENDENT_AMBULATORY_CARE_PROVIDER_SITE_OTHER): Payer: Medicaid Other | Admitting: Family Medicine

## 2021-12-21 ENCOUNTER — Encounter: Payer: Self-pay | Admitting: Family Medicine

## 2021-12-21 VITALS — BP 107/72 | HR 84 | Temp 97.7°F | Resp 16 | Wt 193.8 lb

## 2021-12-21 DIAGNOSIS — E1165 Type 2 diabetes mellitus with hyperglycemia: Secondary | ICD-10-CM | POA: Diagnosis not present

## 2021-12-21 DIAGNOSIS — E785 Hyperlipidemia, unspecified: Secondary | ICD-10-CM | POA: Diagnosis not present

## 2021-12-21 DIAGNOSIS — I10 Essential (primary) hypertension: Secondary | ICD-10-CM

## 2021-12-21 DIAGNOSIS — E1169 Type 2 diabetes mellitus with other specified complication: Secondary | ICD-10-CM | POA: Diagnosis not present

## 2021-12-21 NOTE — Telephone Encounter (Signed)
Requested Prescriptions  Pending Prescriptions Disp Refills  . metFORMIN (GLUCOPHAGE) 500 MG tablet [Pharmacy Med Name: METFORMIN HCL 500 MG TABLET] 180 tablet 0    Sig: TAKE 1 TABLET BY MOUTH TWICE A DAY WITH FOOD     Endocrinology:  Diabetes - Biguanides Failed - 12/18/2021  8:31 AM      Failed - HBA1C is between 0 and 7.9 and within 180 days    Hemoglobin A1C  Date Value Ref Range Status  09/02/2021 10.3 (A) 4.0 - 5.6 % Final   HbA1c, POC (controlled diabetic range)  Date Value Ref Range Status  07/13/2020 7.2 (A) 0.0 - 7.0 % Final         Failed - B12 Level in normal range and within 720 days    No results found for: "VITAMINB12"       Passed - Cr in normal range and within 360 days    Creatinine, Ser  Date Value Ref Range Status  09/02/2021 1.22 0.76 - 1.27 mg/dL Final         Passed - eGFR in normal range and within 360 days    GFR calc Af Amer  Date Value Ref Range Status  01/23/2020 75 >59 mL/min/1.73 Final    Comment:    **[facility] currently reports eGFR in compliance with the current**   recommendations of the [employer]. [facility] will   update reporting as new guidelines are published from the NKF-ASN   Task force.    GFR, Estimated  Date Value Ref Range Status  01/20/2021 >60 >60 mL/min Final    Comment:    (NOTE) Calculated using the CKD-EPI Creatinine Equation (2021)    eGFR  Date Value Ref Range Status  09/02/2021 67 >59 mL/min/1.73 Final         Passed - Valid encounter within last 6 months    Recent Outpatient Visits          Today    Primary Care at [facility], Clyde Canterbury, MD   2 months ago Type 2 diabetes mellitus with hyperglycemia, without long-term current use of insulin [facility])   Primary Care at [facility], MD   3 months ago Type 2 diabetes mellitus with hyperglycemia, without long-term current use of insulin [facility])   Primary Care at [facility], Cari S, PA-C   11 months ago Hypoxia    [facility], MD   12 months ago Essential hypertension   Primary Care at [facility], Flonnie Hailstone, NP      Future Appointments            In 3 days Marybelle Killings, MD [facility]   In 3 months Dorna Mai, MD Primary Care at [facility] - CBC within normal limits and completed in the last 12 months    WBC  Date Value Ref Range Status  09/02/2021 11.8 (H) 3.4 - 10.8 x10E3/uL Final  01/20/2021 9.7 4.0 - 10.5 K/uL Final   RBC  Date Value Ref Range Status  09/02/2021 5.32 4.14 - 5.80 x10E6/uL Final  01/20/2021 4.69 4.22 - 5.81 MIL/uL Final   Hemoglobin  Date Value Ref Range Status  09/02/2021 15.2 13.0 - 17.7 g/dL Final   Hematocrit  Date Value Ref Range Status  09/02/2021 46.4 37.5 - 51.0 % Final   MCHC  Date Value Ref Range Status  09/02/2021 32.8  31.5 - 35.7 g/dL Final  01/20/2021 31.9 30.0 - 36.0 g/dL Final   [facility]  Date Value Ref Range Status  09/02/2021 28.6 26.6 - 33.0 pg Final  01/20/2021 28.4 26.0 - 34.0 pg Final   MCV  Date Value Ref Range Status  09/02/2021 87 79 - 97 fL Final   No results found for: "PLTCOUNTKUC", "LABPLAT", "POCPLA" RDW  Date Value Ref Range Status  09/02/2021 14.1 11.6 - 15.4 % Final

## 2021-12-22 ENCOUNTER — Encounter: Payer: Self-pay | Admitting: Family Medicine

## 2021-12-22 NOTE — Progress Notes (Signed)
Established Patient Office Visit  Subjective    Patient ID: Curtis Williamson, male    DOB: [birth year]  Age: 61 y.o. MRN: [id number]  CC:  Chief Complaint  Patient presents with   Follow-up    HPI Curtis Williamson presents for routine follow up of chronic med issues. Patient denies acute complaints or concerns.    Outpatient Encounter Medications as of 12/21/2021  Medication Sig   Accu-Chek Softclix Lancets lancets Use as instructed   atorvastatin (LIPITOR) 10 MG tablet TAKE 1 TABLET (10 MG TOTAL) BY MOUTH DAILY.   Blood Glucose Monitoring Suppl (ACCU-CHEK GUIDE ME) w/Device KIT 1 each by Does not apply route 2 (two) times daily. Use to check FSBS twice a day. Dx: E11.9   gabapentin (NEURONTIN) 300 MG capsule Take 1 capsule (300 mg total) by mouth 3 (three) times daily.   glucose blood (ACCU-CHEK GUIDE) test strip Use as instructed   hydrOXYzine (ATARAX/VISTARIL) 25 MG tablet Take 1 tablet (25 mg total) by mouth every 6 (six) hours.   JARDIANCE 25 MG TABS tablet TAKE 1 TABLET BY MOUTH DAILY BEFORE BREAKFAST.   latanoprost (XALATAN) 0.005 % ophthalmic solution SMARTSIG:1 Drop(s) In Eye(s) Every Evening   lisinopril (ZESTRIL) 10 MG tablet TAKE 1 TABLET BY MOUTH EVERY DAY   traZODone (DESYREL) 50 MG tablet TAKE 0.5-1 TABLETS BY MOUTH AT BEDTIME AS NEEDED FOR SLEEP.   triamcinolone cream (KENALOG) 0.1 % Apply 1 application topically 2 (two) times daily.   [DISCONTINUED] metFORMIN (GLUCOPHAGE) 500 MG tablet TAKE 1 TABLET BY MOUTH 2 TIMES DAILY WITH A MEAL.   No facility-administered encounter medications on file as of 12/21/2021.    Past Medical History:  Diagnosis Date   Chronic kidney disease    only has one kidney   Diabetes mellitus without complication ([place])    Essential hypertension    Hyperlipidemia    Hypertension    Phreesia 01/13/2020    Past Surgical History:  Procedure Laterality Date   KIDNEY DONATION     MANDIBLE FRACTURE SURGERY     ROTATOR CUFF REPAIR  Right     Family History  Problem Relation Age of Onset   Hypertension Mother    Kidney failure Mother    Kidney disease Mother    Heart disease Mother    ADD / ADHD Daughter    Anxiety disorder Daughter    Alcohol abuse Father    Colon cancer Neg Hx    Stomach cancer Neg Hx    Esophageal cancer Neg Hx    Colon polyps Neg Hx     Social History   Socioeconomic History   Marital status: Single    Spouse name: Not on file   Number of children: 1   Years of education: Not on file   Highest education level: Some college, no degree  Occupational History   Occupation: Administrator  Tobacco Use   Smoking status: Every Day    Packs/day: 0.50    Years: 30.00    Total pack years: 15.00    Types: Cigarettes   Smokeless tobacco: Never   Tobacco comments:    off and on, has tried to quit  Vaping Use   Vaping Use: Never used  Substance and Sexual Activity   Alcohol use: Yes    Comment: rarely   Drug use: Never   Sexual activity: Not Currently  Other Topics Concern   Not on file  Social History Narrative   Right handed   One story home  No caffeine    Social Determinants of Radio broadcast assistant Strain: Not on file  Food Insecurity: Not on file  Transportation Needs: Not on file  Physical Activity: Not on file  Stress: Not on file  Social Connections: Not on file  Intimate Partner Violence: Not on file    Review of Systems  All other systems reviewed and are negative.       Objective    BP 107/72   Pulse 84   Temp 97.7 F (36.5 C) (Oral)   Resp 16   Wt 193 lb 12.8 oz (87.9 kg)   SpO2 93%   BMI 27.03 kg/m   Physical Exam Vitals and nursing note reviewed.  Constitutional:      General: He is not in acute distress. Cardiovascular:     Rate and Rhythm: Normal rate and regular rhythm.  Pulmonary:     Effort: Pulmonary effort is normal.     Breath sounds: Normal breath sounds.  Abdominal:     Palpations: Abdomen is soft.     Tenderness:  There is no abdominal tenderness.  Musculoskeletal:     Right lower leg: No edema.     Left lower leg: No edema.  Neurological:     General: No focal deficit present.     Mental Status: He is alert and oriented to person, place, and time.         Assessment & Plan:   1. Type 2 diabetes mellitus with hyperglycemia, without long-term current use of insulin ([place]) Patient left before obtaining A1c. Continue present management   2. Essential hypertension Appears stable. Continue present management and monitor  3. Hyperlipidemia associated with type 2 diabetes mellitus ([place]) Continue     No follow-ups on file.   Becky Sax, MD

## 2021-12-22 NOTE — Telephone Encounter (Signed)
noted 

## 2021-12-23 ENCOUNTER — Other Ambulatory Visit: Payer: Self-pay | Admitting: Family Medicine

## 2021-12-23 DIAGNOSIS — E1165 Type 2 diabetes mellitus with hyperglycemia: Secondary | ICD-10-CM

## 2021-12-24 ENCOUNTER — Encounter: Payer: Self-pay | Admitting: Orthopaedic Surgery

## 2021-12-24 ENCOUNTER — Ambulatory Visit (INDEPENDENT_AMBULATORY_CARE_PROVIDER_SITE_OTHER): Payer: Worker's Compensation | Admitting: Orthopaedic Surgery

## 2021-12-24 VITALS — BP 123/75 | HR 93 | Ht 71.0 in | Wt 193.0 lb

## 2021-12-24 DIAGNOSIS — M545 Low back pain, unspecified: Secondary | ICD-10-CM

## 2021-12-24 DIAGNOSIS — M5459 Other low back pain: Secondary | ICD-10-CM | POA: Diagnosis not present

## 2021-12-24 DIAGNOSIS — I10 Essential (primary) hypertension: Secondary | ICD-10-CM | POA: Diagnosis not present

## 2021-12-26 NOTE — Progress Notes (Signed)
Office Visit Note   Patient: Curtis Williamson           Date of Birth: 1960/10/22           MRN: [id number] Visit Date: 12/24/2021              Requested by: Georganna Skeans, MD [address] [place],  [place] [address] PCP: Georganna Skeans, MD   Assessment & Plan: Visit Diagnoses:  1. Low back pain without sciatica, unspecified back pain laterality, unspecified chronicity     Plan: Patient was a truck driver.  I ordered an FCE and nothing was done for a year.  He has been sent for an IME by [employer]. which has just been completed.  We will order the 4 weeks of therapy see him back right after the therapy is completed and release him for work resumption.  I agree with his IME opinion concerning PPI.  Follow-Up Instructions: No follow-ups on file.   Orders:  Orders Placed This Encounter  Procedures   Ambulatory referral to Physical Therapy   No orders of the defined types were placed in this encounter.     Procedures: No procedures performed   Clinical Data: No additional findings.   Subjective: Chief Complaint  Patient presents with   Lower Back - Pain    HPI 61 year old male returns with ongoing complaints of back pain with history of on-the-job injury.  Originally saw him 10/28/2020 and ordered FCE evaluation based on findings on with his physical exam.  Worker's Comp. did not follow my plan and patient returned a year later and I saw him on 12/15/2021.  He has an attorney he has not been back to work and just recently was seen at [facility] orthopedics for  an IME with Dr.Sameer Christie Beckers at [employer] a part of [employer].  I have his notes and his recommendation was continued 4 to 6 weeks of physical therapy at which time patient should be at MMI and no PPI rating was recommended.  He did not recommend any surgical intervention or injection therapy.  Review of Systems updated unchanged.   Objective: Vital Signs: BP 123/75   Pulse 93   Ht 5'  11" (1.803 m)   Wt 193 lb (87.5 kg)   BMI 26.92 kg/m   Physical Exam Constitutional:      Appearance: He is well-developed.  HENT:     Head: Normocephalic and atraumatic.     Right Ear: External ear normal.     Left Ear: External ear normal.  Eyes:     Pupils: Pupils are equal, round, and reactive to light.  Neck:     Thyroid: No thyromegaly.     Trachea: No tracheal deviation.  Cardiovascular:     Rate and Rhythm: Normal rate.  Pulmonary:     Effort: Pulmonary effort is normal.     Breath sounds: No wheezing.  Abdominal:     General: Bowel sounds are normal.     Palpations: Abdomen is soft.  Musculoskeletal:     Cervical back: Neck supple.  Skin:    General: Skin is warm and dry.     Capillary Refill: Capillary refill takes less than 2 seconds.  Neurological:     Mental Status: He is alert and oriented to person, place, and time.  Psychiatric:        Behavior: Behavior normal.        Thought Content: Thought content normal.  Judgment: Judgment normal.     Ortho Exam patient has intact reflexes.  He is slow changing from sitting to standing he is able to ambulate with a very slow deliberate gait pattern.  In anterior tib gastrocsoleus is intact.  Specialty Comments:  No specialty comments available.  Imaging: No results found.   PMFS History: Patient Active Problem List   Diagnosis Date Noted   Low back pain 10/28/2020   Pain due to onychomycosis of toenails of both feet 10/09/2020   Hyperlipidemia 09/22/2020   Thickened nails 09/22/2020   Insomnia 09/22/2020   Acute pain of left shoulder 05/19/2020   Tobacco dependence 03/26/2019   Essential hypertension 03/26/2019   Type 2 diabetes mellitus without complication, without long-term current use of insulin (H[facility]) 03/26/2019   Aneurysm, cerebral, nonruptured 03/26/2019   Past Medical History:  Diagnosis Date   Chronic kidney disease    only has one kidney   Diabetes mellitus without complication  (H[facility])    Essential hypertension    Hyperlipidemia    Hypertension    Phreesia 01/13/2020    Family History  Problem Relation Age of Onset   Hypertension Mother    Kidney failure Mother    Kidney disease Mother    Heart disease Mother    ADD / ADHD Daughter    Anxiety disorder Daughter    Alcohol abuse Father    Colon cancer Neg Hx    Stomach cancer Neg Hx    Esophageal cancer Neg Hx    Colon polyps Neg Hx     Past Surgical History:  Procedure Laterality Date   KIDNEY DONATION     MANDIBLE FRACTURE SURGERY     ROTATOR CUFF REPAIR Right    Social History   Occupational History   Occupation: Truck driver  Tobacco Use   Smoking status: Every Day    Packs/day: 0.50    Years: 30.00    Total pack years: 15.00    Types: Cigarettes   Smokeless tobacco: Never   Tobacco comments:    off and on, has tried to quit  Vaping Use   Vaping Use: Never used  Substance and Sexual Activity   Alcohol use: Yes    Comment: rarely   Drug use: Never   Sexual activity: Not Currently

## 2021-12-29 DIAGNOSIS — E119 Type 2 diabetes mellitus without complications: Secondary | ICD-10-CM | POA: Diagnosis not present

## 2022-01-26 ENCOUNTER — Other Ambulatory Visit: Payer: Self-pay | Admitting: Family Medicine

## 2022-01-26 DIAGNOSIS — E1165 Type 2 diabetes mellitus with hyperglycemia: Secondary | ICD-10-CM

## 2022-01-27 NOTE — Telephone Encounter (Signed)
Requested medication (s) are due for refill today: pharmacy requesting Prior approval   Requested medication (s) are on the active medication list: yes   Last refill:  01/27/22 #30 1 refill  Future visit scheduled: yes in 1 month  Notes to clinic:  pharmacy requesting prior approval. Error occurred , need to resend to pharmacy.     Requested Prescriptions  Pending Prescriptions Disp Refills   empagliflozin (JARDIANCE) 25 MG TABS tablet 30 tablet 1     Endocrinology:  Diabetes - SGLT2 Inhibitors Failed - 01/27/2022  4:45 PM      Failed - HBA1C is between 0 and 7.9 and within 180 days    Hemoglobin A1C  Date Value Ref Range Status  09/02/2021 10.3 (A) 4.0 - 5.6 % Final   HbA1c, POC (controlled diabetic range)  Date Value Ref Range Status  07/13/2020 7.2 (A) 0.0 - 7.0 % Final         Passed - Cr in normal range and within 360 days    Creatinine, Ser  Date Value Ref Range Status  09/02/2021 1.22 0.76 - 1.27 mg/dL Final         Passed - eGFR in normal range and within 360 days    GFR calc Af Amer  Date Value Ref Range Status  01/23/2020 75 >59 mL/min/1.73 Final    Comment:    **[facility] currently reports eGFR in compliance with the current**   recommendations of the [employer]. [facility] will   update reporting as new guidelines are published from the NKF-ASN   Task force.    GFR, Estimated  Date Value Ref Range Status  01/20/2021 >60 >60 mL/min Final    Comment:    (NOTE) Calculated using the CKD-EPI Creatinine Equation (2021)    eGFR  Date Value Ref Range Status  09/02/2021 67 >59 mL/min/1.73 Final         Passed - Valid encounter within last 6 months    Recent Outpatient Visits           1 month ago Type 2 diabetes mellitus with hyperglycemia, without long-term current use of insulin ([place])   Primary Care at [facility], Clyde Canterbury, MD   3 months ago Type 2 diabetes mellitus with hyperglycemia, without long-term current use of insulin  [facility])   Primary Care at [facility], Clyde Canterbury, MD   4 months ago Type 2 diabetes mellitus with hyperglycemia, without long-term current use of insulin [facility])   Primary Care at [facility], Loraine Grip, PA-C   1 year ago Hypoxia   Primary Care at [facility], MD   1 year ago Essential hypertension   Primary Care at [facility], [place], NP       Future Appointments             In 1 month Dorna Mai, MD [facility] Care at [facility]            Signed Prescriptions Disp Refills   JARDIANCE 25 MG TABS tablet 30 tablet 1    Sig: TAKE 1 TABLET BY MOUTH [place]     Endocrinology:  Diabetes - SGLT2 Inhibitors Failed - 01/26/2022  8:54 AM      Failed - HBA1C is between 0 and 7.9 and within 180 days    Hemoglobin A1C  Date Value Ref Range Status  09/02/2021 10.3 (A) 4.0 - 5.6 % Final   HbA1c, POC (controlled diabetic range)  Date  Value Ref Range Status  07/13/2020 7.2 (A) 0.0 - 7.0 % Final         Passed - Cr in normal range and within 360 days    Creatinine, Ser  Date Value Ref Range Status  09/02/2021 1.22 0.76 - 1.27 mg/dL Final         Passed - eGFR in normal range and within 360 days    GFR calc Af Amer  Date Value Ref Range Status  01/23/2020 75 >59 mL/min/1.73 Final    Comment:    **[facility] currently reports eGFR in compliance with the current**   recommendations of the [employer]. [facility] will   update reporting as new guidelines are published from the NKF-ASN   Task force.    GFR, Estimated  Date Value Ref Range Status  01/20/2021 >60 >60 mL/min Final    Comment:    (NOTE) Calculated using the CKD-EPI Creatinine Equation (2021)    eGFR  Date Value Ref Range Status  09/02/2021 67 >59 mL/min/1.73 Final         Passed - Valid encounter within last 6 months    Recent Outpatient Visits           1 month ago Type 2 diabetes mellitus with hyperglycemia, without long-term  current use of insulin ([place])   Primary Care at [facility], Clyde Canterbury, MD   3 months ago Type 2 diabetes mellitus with hyperglycemia, without long-term current use of insulin [facility])   Primary Care at [facility], Clyde Canterbury, MD   4 months ago Type 2 diabetes mellitus with hyperglycemia, without long-term current use of insulin [facility])   Primary Care at [facility], Loraine Grip, PA-C   1 year ago Hypoxia   Primary Care at [facility], MD   1 year ago Essential hypertension   Primary Care at [facility], Flonnie Hailstone, NP       Future Appointments             In 1 month Dorna Mai, MD Primary Care at [facility]

## 2022-01-27 NOTE — Addendum Note (Signed)
Addended by: Mliss Sax on: 01/27/2022 04:45 PM   Modules accepted: Orders

## 2022-01-28 DIAGNOSIS — E119 Type 2 diabetes mellitus without complications: Secondary | ICD-10-CM | POA: Diagnosis not present

## 2022-02-01 DIAGNOSIS — H401111 Primary open-angle glaucoma, right eye, mild stage: Secondary | ICD-10-CM | POA: Diagnosis not present

## 2022-02-01 DIAGNOSIS — H401122 Primary open-angle glaucoma, left eye, moderate stage: Secondary | ICD-10-CM | POA: Diagnosis not present

## 2022-02-01 DIAGNOSIS — H2513 Age-related nuclear cataract, bilateral: Secondary | ICD-10-CM | POA: Diagnosis not present

## 2022-02-02 ENCOUNTER — Ambulatory Visit: Payer: Medicaid Other | Admitting: Orthopaedic Surgery

## 2022-02-27 DIAGNOSIS — E119 Type 2 diabetes mellitus without complications: Secondary | ICD-10-CM | POA: Diagnosis not present

## 2022-03-03 ENCOUNTER — Other Ambulatory Visit: Payer: Self-pay | Admitting: Family Medicine

## 2022-03-03 DIAGNOSIS — E1165 Type 2 diabetes mellitus with hyperglycemia: Secondary | ICD-10-CM

## 2022-03-03 NOTE — Telephone Encounter (Signed)
Refused Jardiance 25 mg tablet because requested too soon.

## 2022-03-04 ENCOUNTER — Telehealth: Payer: Self-pay | Admitting: Family Medicine

## 2022-03-04 NOTE — Telephone Encounter (Signed)
[facility] calling to follow up on order faxed 02/23/22 for glucose blood (ACCU-CHEK GUIDE) test strip  Calibrator solution, and chips  They are his DME supplier, she said they have been supplying for quite some time.  Cb [phone] Fax  [phone]

## 2022-03-22 ENCOUNTER — Ambulatory Visit: Payer: Medicaid Other | Admitting: Family Medicine

## 2022-03-27 ENCOUNTER — Other Ambulatory Visit: Payer: Self-pay | Admitting: Family

## 2022-03-27 DIAGNOSIS — E1165 Type 2 diabetes mellitus with hyperglycemia: Secondary | ICD-10-CM

## 2022-03-29 DIAGNOSIS — E119 Type 2 diabetes mellitus without complications: Secondary | ICD-10-CM | POA: Diagnosis not present

## 2022-04-12 ENCOUNTER — Other Ambulatory Visit: Payer: Self-pay | Admitting: Family Medicine

## 2022-04-12 DIAGNOSIS — E1165 Type 2 diabetes mellitus with hyperglycemia: Secondary | ICD-10-CM

## 2022-04-19 ENCOUNTER — Other Ambulatory Visit: Payer: Self-pay

## 2022-04-19 ENCOUNTER — Emergency Department (HOSPITAL_COMMUNITY)
Admission: EM | Admit: 2022-04-19 | Discharge: 2022-04-19 | Disposition: A | Discharge status: Home / Self Care | Payer: Medicaid Other | Attending: Emergency Medicine | Admitting: Emergency Medicine

## 2022-04-19 DIAGNOSIS — I129 Hypertensive chronic kidney disease with stage 1 through stage 4 chronic kidney disease, or unspecified chronic kidney disease: Secondary | ICD-10-CM | POA: Diagnosis not present

## 2022-04-19 DIAGNOSIS — Z7984 Long term (current) use of oral hypoglycemic drugs: Secondary | ICD-10-CM | POA: Diagnosis not present

## 2022-04-19 DIAGNOSIS — R195 Other fecal abnormalities: Secondary | ICD-10-CM | POA: Insufficient documentation

## 2022-04-19 DIAGNOSIS — R110 Nausea: Secondary | ICD-10-CM | POA: Insufficient documentation

## 2022-04-19 DIAGNOSIS — R6889 Other general symptoms and signs: Secondary | ICD-10-CM

## 2022-04-19 DIAGNOSIS — J111 Influenza due to unidentified influenza virus with other respiratory manifestations: Secondary | ICD-10-CM | POA: Diagnosis not present

## 2022-04-19 DIAGNOSIS — E1122 Type 2 diabetes mellitus with diabetic chronic kidney disease: Secondary | ICD-10-CM | POA: Insufficient documentation

## 2022-04-19 DIAGNOSIS — Z1152 Encounter for screening for COVID-19: Secondary | ICD-10-CM | POA: Diagnosis not present

## 2022-04-19 DIAGNOSIS — N189 Chronic kidney disease, unspecified: Secondary | ICD-10-CM | POA: Insufficient documentation

## 2022-04-19 DIAGNOSIS — R519 Headache, unspecified: Secondary | ICD-10-CM | POA: Insufficient documentation

## 2022-04-19 DIAGNOSIS — R509 Fever, unspecified: Secondary | ICD-10-CM | POA: Insufficient documentation

## 2022-04-19 LAB — RESP PANEL BY RT-PCR (RSV, FLU A&B, COVID)  RVPGX2
Influenza A by PCR: NEGATIVE
Influenza B by PCR: NEGATIVE
Resp Syncytial Virus by PCR: NEGATIVE
SARS Coronavirus 2 by RT PCR: NEGATIVE

## 2022-04-19 NOTE — ED Triage Notes (Signed)
Pt c/o COVID symptoms including non-productive cough, fever, fatigue, headache and "just feeling bad" X3 days

## 2022-04-19 NOTE — Discharge Instructions (Addendum)
You were seen in the ER today for your flu-like symptoms. You have been tested for COVID and the flu. You may follow these test results in your mychart account, and return to the ER with any new severe symptoms.

## 2022-04-19 NOTE — ED Provider Notes (Signed)
[facility] EMERGENCY DEPARTMENT Provider Note   CSN: [id number] Arrival date & time: 04/19/22  0536     History  Chief Complaint  Patient presents with   Cough    Curtis Williamson is a 62 y.o. male who presents with bodyaches, fever, headache, nausea some loose stool and nonproductive cough for the last 3 days.  Requesting COVID test so that he "does not spread anything to my family".  I personally reviewed his medical records previous history of type 2 diabetes, hypertension, hyperlipidemia, CKD.  No anticoagulation.  Endorses decreased appetite but drinking normally and putting out a normal amount of urine.  HPI     Home Medications Prior to Admission medications   Medication Sig Start Date End Date Taking? Authorizing Provider  Accu-Chek Softclix Lancets lancets Use as instructed 09/22/20   Mayers, Cari S, PA-C  atorvastatin (LIPITOR) 10 MG tablet TAKE 1 TABLET (10 MG TOTAL) BY MOUTH DAILY. 01/18/21   Dorna Mai, MD  Blood Glucose Monitoring Suppl (ACCU-CHEK GUIDE ME) w/Device KIT 1 each by Does not apply route 2 (two) times daily. Use to check FSBS twice a day. Dx: E11.9 09/22/20   Mayers, Cari S, PA-C  gabapentin (NEURONTIN) 300 MG capsule Take 1 capsule (300 mg total) by mouth 3 (three) times daily. 10/01/21 10/01/22  Dorna Mai, MD  glucose blood (ACCU-CHEK GUIDE) test strip Use as instructed 09/22/20   Mayers, Cari S, PA-C  hydrOXYzine (ATARAX/VISTARIL) 25 MG tablet Take 1 tablet (25 mg total) by mouth every 6 (six) hours. 11/19/20   Mayers, Cari S, PA-C  JARDIANCE 25 MG TABS tablet TAKE 1 TABLET BY MOUTH EVERY DAY BEFORE BREAKFAST 04/12/22   Dorna Mai, MD  latanoprost (XALATAN) 0.005 % ophthalmic solution SMARTSIG:1 Drop(s) In Eye(s) Every Evening 08/28/21   [provider]  lisinopril (ZESTRIL) 10 MG tablet TAKE 1 TABLET BY MOUTH EVERY DAY 10/26/21   Dorna Mai, MD  metFORMIN (GLUCOPHAGE) 500 MG tablet TAKE 1 TABLET BY MOUTH TWICE A DAY  WITH FOOD 03/27/22   Dorna Mai, MD  traZODone (DESYREL) 50 MG tablet TAKE 0.5-1 TABLETS BY MOUTH AT BEDTIME AS NEEDED FOR SLEEP. 04/28/21   Dorna Mai, MD  triamcinolone cream (KENALOG) 0.1 % Apply 1 application topically 2 (two) times daily. 07/11/19   Nicolette Bang, MD      Allergies    Mushroom extract complex    Review of Systems   Review of Systems  Constitutional:  Positive for activity change, appetite change, chills, fatigue and fever.  HENT:  Positive for congestion and rhinorrhea.   Respiratory:  Positive for cough. Negative for chest tightness and shortness of breath.   Cardiovascular: Negative.   Gastrointestinal:  Positive for diarrhea. Negative for abdominal pain, nausea and vomiting.  Musculoskeletal:  Positive for myalgias. Negative for neck pain.  Skin: Negative.     Physical Exam Updated Vital Signs BP 103/71   Pulse 98   Temp 98.2 F (36.8 C) (Oral)   Resp 20   Ht _0  (1.803 m)   Wt 87.5 kg   SpO2 98%   BMI 26.90 kg/m  Physical Exam Vitals and nursing note reviewed.  Constitutional:      Appearance: He is not ill-appearing or toxic-appearing.  HENT:     Head: Normocephalic and atraumatic.     Nose: Congestion present.     Mouth/Throat:     Mouth: Mucous membranes are moist.     Pharynx: No oropharyngeal exudate or posterior oropharyngeal  erythema.  Eyes:     General:        Right eye: No discharge.        Left eye: No discharge.     Extraocular Movements: Extraocular movements intact.     Conjunctiva/sclera: Conjunctivae normal.     Pupils: Pupils are equal, round, and reactive to light.  Cardiovascular:     Rate and Rhythm: Normal rate and regular rhythm.     Pulses: Normal pulses.     Heart sounds: Normal heart sounds. No murmur heard. Pulmonary:     Effort: Pulmonary effort is normal. No respiratory distress.     Breath sounds: Normal breath sounds. No wheezing or rales.  Abdominal:     General: Bowel sounds are  normal. There is no distension.     Palpations: Abdomen is soft.     Tenderness: There is no abdominal tenderness. There is no guarding or rebound.  Musculoskeletal:        General: No deformity.     Cervical back: Neck supple.     Right lower leg: No edema.     Left lower leg: No edema.  Skin:    General: Skin is warm and dry.     Capillary Refill: Capillary refill takes less than 2 seconds.  Neurological:     General: No focal deficit present.     Mental Status: He is alert and oriented to person, place, and time. Mental status is at baseline.  Psychiatric:        Mood and Affect: Mood normal.     ED Results / Procedures / Treatments   Labs (all labs ordered are listed, but only abnormal results are displayed) Labs Reviewed  RESP PANEL BY RT-PCR (RSV, FLU A&B, COVID)  [id number]    EKG None  Radiology No results found.  Procedures Procedures    Medications Ordered in ED Medications - No data to display  ED Course/ Medical Decision Making/ A&P                           Medical Decision Making 62 year old male who presents with concern for flulike symptoms.  Mild tachycardic and low normal pressure on intake, vitals otherwise normal.  Cardiopulmonary and abdominal exams are benign.  Patient with reassuring hydration status clinically with moist mucous membranes, normal capillary refill, resolution of tachycardia at time my evaluation.     Clinical picture most consistent with acute viral illness, patient has been tested for COVID and the flu.  Strong desire by patient to be discharged at this time and follow-up with results at home.  Low normal BP 103/71.  Patient agrees to monitor his symptoms, was directed to avoid his antihypertensive medication this morning, increase his hydration and follow-up closely with his PCP.  Strict return precautions are given.  Clinical concern for emergent underlying etiology that warrant further ED workup or inpatient management is  exceedingly low.  Adhrit voiced understanding of his medical evaluation and treatment plan. Each of their questions answered to their expressed satisfaction.  Return precautions were given.  Patient is well-appearing, stable, and was discharged in good condition.  This chart was dictated using voice recognition software, Dragon. Despite the best efforts of this provider to proofread and correct errors, errors may still occur which can change documentation meaning.     Final Clinical Impression(s) / ED Diagnoses Final diagnoses:  Flu-like symptoms    Rx / DC Orders ED Discharge Orders  None         Emeline Darling, PA-C 04/19/22 0600    Veryl Speak, MD 04/20/22 [phone]

## 2022-04-28 DIAGNOSIS — E119 Type 2 diabetes mellitus without complications: Secondary | ICD-10-CM | POA: Diagnosis not present

## 2022-05-28 DIAGNOSIS — E119 Type 2 diabetes mellitus without complications: Secondary | ICD-10-CM | POA: Diagnosis not present

## 2022-06-02 ENCOUNTER — Other Ambulatory Visit: Payer: Self-pay

## 2022-06-02 ENCOUNTER — Encounter (HOSPITAL_COMMUNITY): Payer: Self-pay

## 2022-06-02 ENCOUNTER — Emergency Department (HOSPITAL_COMMUNITY)
Admission: EM | Admit: 2022-06-02 | Discharge: 2022-06-02 | Disposition: A | Discharge status: Home / Self Care | Payer: Medicaid Other | Attending: Emergency Medicine | Admitting: Emergency Medicine

## 2022-06-02 ENCOUNTER — Emergency Department (HOSPITAL_COMMUNITY): Payer: Medicaid Other

## 2022-06-02 DIAGNOSIS — Q283 Other malformations of cerebral vessels: Secondary | ICD-10-CM | POA: Diagnosis not present

## 2022-06-02 DIAGNOSIS — R519 Headache, unspecified: Secondary | ICD-10-CM | POA: Diagnosis not present

## 2022-06-02 DIAGNOSIS — Z8679 Personal history of other diseases of the circulatory system: Secondary | ICD-10-CM | POA: Diagnosis not present

## 2022-06-02 LAB — BASIC METABOLIC PANEL
Anion gap: 11 (ref 5–15)
BUN: 11 mg/dL (ref 8–23)
CO2: 25 mmol/L (ref 22–32)
Calcium: 9 mg/dL (ref 8.9–10.3)
Chloride: 103 mmol/L (ref 98–111)
Creatinine, Ser: 1.16 mg/dL (ref 0.61–1.24)
GFR, Estimated: >60 mL/min (ref >60)
Glucose, Bld: 183 mg/dL — ABNORMAL HIGH (ref 70–99)
Potassium: 3.2 mmol/L — ABNORMAL LOW (ref 3.5–5.1)
Sodium: 139 mmol/L (ref 135–145)

## 2022-06-02 LAB — CBC WITH DIFFERENTIAL/PLATELET
Abs Immature Granulocytes: 0.08 10*3/uL — ABNORMAL HIGH (ref 0.00–0.07)
Basophils Absolute: 0.1 10*3/uL (ref 0.0–0.1)
Basophils Relative: 1 %
Eosinophils Absolute: 0.2 10*3/uL (ref 0.0–0.5)
Eosinophils Relative: 1 %
HCT: 46.4 % (ref 39.0–52.0)
Hemoglobin: 15.3 g/dL (ref 13.0–17.0)
Immature Granulocytes: 1 %
Lymphocytes Relative: 17 %
Lymphs Abs: 3 10*3/uL (ref 0.7–4.0)
MCH: 29.9 pg (ref 26.0–34.0)
MCHC: 33 g/dL (ref 30.0–36.0)
MCV: 90.6 fL (ref 80.0–100.0)
Monocytes Absolute: 1 10*3/uL (ref 0.1–1.0)
Monocytes Relative: 6 %
Neutro Abs: 13 10*3/uL — ABNORMAL HIGH (ref 1.7–7.7)
Neutrophils Relative %: 74 %
Platelets: 397 10*3/uL (ref 150–400)
RBC: 5.12 MIL/uL (ref 4.22–5.81)
RDW: 14.6 % (ref 11.5–15.5)
WBC: 17.4 10*3/uL — ABNORMAL HIGH (ref 4.0–10.5)
nRBC: 0 % (ref 0.0–0.2)

## 2022-06-02 MED ORDER — SODIUM CHLORIDE 0.9 % IV BOLUS
1000.0000 mL | Freq: Once | INTRAVENOUS | Status: AC
  Administered 2022-06-02: 1000 mL via INTRAVENOUS

## 2022-06-02 MED ORDER — DIPHENHYDRAMINE HCL 50 MG/ML IJ SOLN
12.5000 mg | Freq: Once | INTRAMUSCULAR | Status: AC
  Administered 2022-06-02: 12.5 mg via INTRAVENOUS
  Filled 2022-06-02: qty 1

## 2022-06-02 MED ORDER — PROCHLORPERAZINE EDISYLATE 10 MG/2ML IJ SOLN
10.0000 mg | Freq: Once | INTRAMUSCULAR | Status: AC
  Administered 2022-06-02: 10 mg via INTRAVENOUS
  Filled 2022-06-02: qty 2

## 2022-06-02 MED ORDER — IOHEXOL 350 MG/ML SOLN
75.0000 mL | Freq: Once | INTRAVENOUS | Status: AC | PRN
  Administered 2022-06-02: 75 mL via INTRAVENOUS

## 2022-06-02 NOTE — ED Provider Notes (Signed)
[facility] Provider Note   CSN: [id number] Arrival date & time: 06/02/22  1235     History  CC: headache  Curtis Williamson is a 62 y.o. male.  Patient here with migraine type headache for the last day or 2.  Nothing makes it worse or better.  Has a history of aneurysms being followed by neurosurgery.  Monitor to make sure those were okay.  Overall mild headache.  History of headaches in the past.  Does not endorse any sudden thunderclap headache.  This is not the worst headache of his life.  He has a history of diabetes and high cholesterol.  Denies any neck pain or fever chills or bodyaches.  He does admit to poor sleep and some stressors.  The history is provided by the patient.       Home Medications Prior to Admission medications   Medication Sig Start Date End Date Taking? Authorizing Provider  Accu-Chek Softclix Lancets lancets Use as instructed 09/22/20   Mayers, Cari S, PA-C  atorvastatin (LIPITOR) 10 MG tablet TAKE 1 TABLET (10 MG TOTAL) BY MOUTH DAILY. 01/18/21   Dorna Mai, MD  Blood Glucose Monitoring Suppl (ACCU-CHEK GUIDE ME) w/Device KIT 1 each by Does not apply route 2 (two) times daily. Use to check FSBS twice a day. Dx: E11.9 09/22/20   Mayers, Cari S, PA-C  gabapentin (NEURONTIN) 300 MG capsule Take 1 capsule (300 mg total) by mouth 3 (three) times daily. 10/01/21 10/01/22  Dorna Mai, MD  glucose blood (ACCU-CHEK GUIDE) test strip Use as instructed 09/22/20   Mayers, Cari S, PA-C  hydrOXYzine (ATARAX/VISTARIL) 25 MG tablet Take 1 tablet (25 mg total) by mouth every 6 (six) hours. 11/19/20   Mayers, Cari S, PA-C  JARDIANCE 25 MG TABS tablet TAKE 1 TABLET BY MOUTH EVERY DAY BEFORE BREAKFAST 04/12/22   Dorna Mai, MD  latanoprost (XALATAN) 0.005 % ophthalmic solution SMARTSIG:1 Drop(s) In Eye(s) Every Evening 08/28/21   [provider]  lisinopril (ZESTRIL) 10 MG tablet TAKE 1 TABLET BY MOUTH EVERY DAY 10/26/21    Dorna Mai, MD  metFORMIN (GLUCOPHAGE) 500 MG tablet TAKE 1 TABLET BY MOUTH TWICE A DAY WITH FOOD 03/27/22   Dorna Mai, MD  traZODone (DESYREL) 50 MG tablet TAKE 0.5-1 TABLETS BY MOUTH AT BEDTIME AS NEEDED FOR SLEEP. 04/28/21   Dorna Mai, MD  triamcinolone cream (KENALOG) 0.1 % Apply 1 application topically 2 (two) times daily. 07/11/19   Nicolette Bang, MD      Allergies    Mushroom extract complex    Review of Systems   Review of Systems  Physical Exam Updated Vital Signs BP 121/82   Pulse 100   Temp 98.5 F (36.9 C) (Oral)   Resp (!) 21   Ht 5' 11"$  (1.803 m)   Wt 82.6 kg   SpO2 95%   BMI 25.38 kg/m  Physical Exam Vitals and nursing note reviewed.  Constitutional:      General: He is not in acute distress.    Appearance: He is well-developed. He is not ill-appearing.  HENT:     Head: Normocephalic and atraumatic.     Mouth/Throat:     Mouth: Mucous membranes are moist.  Eyes:     Extraocular Movements: Extraocular movements intact.     Conjunctiva/sclera: Conjunctivae normal.     Pupils: Pupils are equal, round, and reactive to light.  Cardiovascular:     Rate and Rhythm: Normal rate and regular  rhythm.     Pulses: Normal pulses.     Heart sounds: Normal heart sounds. No murmur heard. Pulmonary:     Effort: Pulmonary effort is normal. No respiratory distress.     Breath sounds: Normal breath sounds.  Abdominal:     Palpations: Abdomen is soft.     Tenderness: There is no abdominal tenderness.  Musculoskeletal:        General: No swelling.     Cervical back: Normal range of motion and neck supple.  Skin:    General: Skin is warm and dry.     Capillary Refill: Capillary refill takes less than 2 seconds.  Neurological:     General: No focal deficit present.     Mental Status: He is alert and oriented to person, place, and time.     Cranial Nerves: No cranial nerve deficit.     Sensory: No sensory deficit.     Motor: No weakness.      Coordination: Coordination normal.     Comments: 5+ out of 5 strength throughout, normal sensation, no drift, normal finger-nose-finger, normal speech  Psychiatric:        Mood and Affect: Mood normal.     ED Results / Procedures / Treatments   Labs (all labs ordered are listed, but only abnormal results are displayed) Labs Reviewed  BASIC METABOLIC PANEL - Abnormal; Notable for the following components:      Result Value   Potassium 3.2 (*)    Glucose, Bld 183 (*)    All other components within normal limits  CBC WITH DIFFERENTIAL/PLATELET - Abnormal; Notable for the following components:   WBC 17.4 (*)    Neutro Abs 13.0 (*)    Abs Immature Granulocytes 0.08 (*)    All other components within normal limits    EKG None  Radiology CT Angio Head Neck W WO CM  Result Date: 06/02/2022 CLINICAL DATA:  Known cerebral aneurysm with worsening headache. EXAM: CT HEAD WITHOUT CONTRAST CT ANGIOGRAPHY OF THE HEAD AND NECK TECHNIQUE: Contiguous axial images were obtained from the base of the skull through the vertex without intravenous contrast. Multidetector CT imaging of the head and neck was performed using the standard protocol during bolus administration of intravenous contrast. Multiplanar CT image reconstructions and MIPs were obtained to evaluate the vascular anatomy. Carotid stenosis measurements (when applicable) are obtained utilizing NASCET criteria, using the distal internal carotid diameter as the denominator. RADIATION DOSE REDUCTION: This exam was performed according to the departmental dose-optimization program which includes automated exposure control, adjustment of the mA and/or kV according to patient size and/or use of iterative reconstruction technique. CONTRAST:  73m OMNIPAQUE IOHEXOL 350 MG/ML SOLN COMPARISON:  MRA head 11/01/2021. FINDINGS: CT HEAD Brain: No acute hemorrhage, mass effect or midline shift. Gray-white differentiation is preserved. No hydrocephalus. No  extra-axial collection. Basilar cisterns are patent. Vascular: No hyperdense vessel or unexpected calcification. Skull: No calvarial fracture or suspicious bone lesion. Skull base is unremarkable. Sinuses/Orbits: Unremarkable. CTA NECK Aortic arch: 4 vessel arch configuration with common origin of the right brachiocephalic and left common carotid arteries, separate origin of the left vertebral artery, and aberrant right vertebral artery coursing posterior to the esophagus. Arch vessel origins are patent. Right carotid system: The common and internal carotid arteries are patent to the skull base without stenosis, aneurysm or dissection. Left carotid system: The common and internal carotid arteries are patent to the skull base without stenosis, aneurysm or dissection. Vertebral arteries:Patent from the origin to  the confluence with the basilar without stenosis or dissection. Skeleton: Unremarkable. Other neck: Unremarkable. 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 The previously questioned medially projecting outpouching arising from the left ACA near the A1-A2 junction is not visualized. The MCAs are patent without stenosis or aneurysm. Distal branches are symmetric. Posterior circulation: Normal basilar artery. The SCAs, AICAs and PICAs are patent proximally. The PCAs are patent proximally without stenosis or aneurysm. Distal branches are symmetric. Venous sinuses: Patent. Anatomic variants: Hypoplastic right A1 segment. IMPRESSION: 1. No acute intracranial abnormality. 2. 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 The previously questioned outpouching arising from the left ACA near the A1-A2 junction is not visualized. Electronically Signed   By: Emmit Alexanders M.D.   On: 06/02/2022  16:10    Procedures Procedures    Medications Ordered in ED Medications  iohexol (OMNIPAQUE) 350 MG/ML injection 75 mL (75 mLs Intravenous Contrast Given 06/02/22 1528)  prochlorperazine (COMPAZINE) injection 10 mg (10 mg Intravenous Given 06/02/22 1645)  diphenhydrAMINE (BENADRYL) injection 12.5 mg (12.5 mg Intravenous Given 06/02/22 1644)  sodium chloride 0.9 % bolus 1,000 mL (0 mLs Intravenous Stopped 06/02/22 1833)    ED Course/ Medical Decision Making/ A&P                             Medical Decision Making Risk Prescription drug management.   Curtis Williamson is here with headache.  History of high cholesterol and hypertension and known aneurysms in his brain.  Normal vitals.  No fever.  Normal neurological exam.  Very well-appearing.  Differential diagnosis likely migraine type headache.  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  Will get a CTA head and neck, CBC BMP headache cocktail with Benadryl and Compazine IV fluids.  Per my review interpretation labs scattered white count of 17 but overall nonspecific.  No significant anemia or electrolyte abnormality or kidney injury.  I have no concern for meningitis.  No concern for subarachnoid hemorrhage.  CT per radiology reports unremarkable.  Did not really identify any major aneurysms or growth of aneurysms seen prior.  Overall these areas appear to be stable.  There is no bleeding.  History physicals not consistent with any bleed and overall suspect migraine type headache.  He got improvement.  Will have him follow-up with his primary doctor as outpatient.  Discharged in good condition.  This chart was dictated using voice recognition software.  Despite best efforts to proofread,  errors can occur which can change the documentation meaning.        Final Clinical Impression(s) / ED Diagnoses Final diagnoses:  Nonintractable headache, unspecified chronicity pattern,  unspecified headache type    Rx / DC Orders ED Discharge Orders     None         Lennice Sites, DO 06/02/22 1835

## 2022-06-02 NOTE — ED Provider Triage Note (Signed)
Emergency Medicine Provider Triage Evaluation Note  Curtis Williamson , a 62 y.o. male  was evaluated in triage.  Pt complains of headache.  Patient states that he has had about 3 weeks of headache that was significantly worse last night and this morning.  He states that he has history of cerebral aneurysm that is being monitored.  He states that he has intermittently had blurry vision in his left eye and feels like there is pressure behind the left eye.  He denies numbness or tingling to his face, facial droop, slurred speech, confusion, numbness or tingling or weakness to his extremities..  Review of Systems  Positive: See above Negative:   Physical Exam  BP 124/87 (BP Location: Left Arm)   Pulse (!) 107   Temp 98.9 F (37.2 C) (Oral)   Resp 16   Ht 5' 11"$  (1.803 m)   Wt 82.6 kg   SpO2 92%   BMI 25.38 kg/m  Gen:   Awake, no distress   Resp:  Normal effort  MSK:   Moves extremities without difficulty  Other:  Slight dysarthria which is baseline for patient.  No cranial nerve deficits.  Strength is equal bilaterally.  Medical Decision Making  Medically screening exam initiated at 1:14 PM.  Appropriate orders placed.  Curtis Williamson was informed that the remainder of the evaluation will be completed by another provider, this initial triage assessment does not replace that evaluation, and the importance of remaining in the ED until their evaluation is complete.  Obtain CTA head and neck   Curtis Hillier, PA-C 06/02/22 1319

## 2022-06-02 NOTE — ED Triage Notes (Signed)
Pt came in POV d/t a HA that started last night in the same area he had an aneurysm at 3-4 yrs ago. Pt reports after 18 hrs worth of a HA he decided to come in to ED to be safe for evaluation. A/Ox4, endorses current HA pain 4/10.

## 2022-06-02 NOTE — ED Notes (Signed)
CT contacted and informed that patient has IV and bmp is resulted

## 2022-06-07 DIAGNOSIS — H401122 Primary open-angle glaucoma, left eye, moderate stage: Secondary | ICD-10-CM | POA: Diagnosis not present

## 2022-06-12 ENCOUNTER — Other Ambulatory Visit: Payer: Self-pay | Admitting: Family Medicine

## 2022-06-12 DIAGNOSIS — E1165 Type 2 diabetes mellitus with hyperglycemia: Secondary | ICD-10-CM

## 2022-06-18 DIAGNOSIS — Z6826 Body mass index (BMI) 26.0-26.9, adult: Secondary | ICD-10-CM | POA: Diagnosis not present

## 2022-06-18 DIAGNOSIS — E559 Vitamin D deficiency, unspecified: Secondary | ICD-10-CM | POA: Diagnosis not present

## 2022-06-18 DIAGNOSIS — Z125 Encounter for screening for malignant neoplasm of prostate: Secondary | ICD-10-CM | POA: Diagnosis not present

## 2022-06-18 DIAGNOSIS — M542 Cervicalgia: Secondary | ICD-10-CM | POA: Diagnosis not present

## 2022-06-18 DIAGNOSIS — G8929 Other chronic pain: Secondary | ICD-10-CM | POA: Diagnosis not present

## 2022-06-18 DIAGNOSIS — R5383 Other fatigue: Secondary | ICD-10-CM | POA: Diagnosis not present

## 2022-06-18 DIAGNOSIS — M546 Pain in thoracic spine: Secondary | ICD-10-CM | POA: Diagnosis not present

## 2022-06-18 DIAGNOSIS — Z1159 Encounter for screening for other viral diseases: Secondary | ICD-10-CM | POA: Diagnosis not present

## 2022-06-18 DIAGNOSIS — M545 Low back pain, unspecified: Secondary | ICD-10-CM | POA: Diagnosis not present

## 2022-06-18 DIAGNOSIS — Z9181 History of falling: Secondary | ICD-10-CM | POA: Diagnosis not present

## 2022-06-18 DIAGNOSIS — E119 Type 2 diabetes mellitus without complications: Secondary | ICD-10-CM | POA: Diagnosis not present

## 2022-06-18 DIAGNOSIS — M25551 Pain in right hip: Secondary | ICD-10-CM | POA: Diagnosis not present

## 2022-06-18 DIAGNOSIS — M129 Arthropathy, unspecified: Secondary | ICD-10-CM | POA: Diagnosis not present

## 2022-06-18 DIAGNOSIS — Z79899 Other long term (current) drug therapy: Secondary | ICD-10-CM | POA: Diagnosis not present

## 2022-06-23 DIAGNOSIS — G8929 Other chronic pain: Secondary | ICD-10-CM | POA: Diagnosis not present

## 2022-06-23 DIAGNOSIS — Z6826 Body mass index (BMI) 26.0-26.9, adult: Secondary | ICD-10-CM | POA: Diagnosis not present

## 2022-06-23 DIAGNOSIS — Z79899 Other long term (current) drug therapy: Secondary | ICD-10-CM | POA: Diagnosis not present

## 2022-06-23 DIAGNOSIS — Z9181 History of falling: Secondary | ICD-10-CM | POA: Diagnosis not present

## 2022-06-23 DIAGNOSIS — M25551 Pain in right hip: Secondary | ICD-10-CM | POA: Diagnosis not present

## 2022-06-23 DIAGNOSIS — E559 Vitamin D deficiency, unspecified: Secondary | ICD-10-CM | POA: Diagnosis not present

## 2022-06-23 DIAGNOSIS — E119 Type 2 diabetes mellitus without complications: Secondary | ICD-10-CM | POA: Diagnosis not present

## 2022-06-24 ENCOUNTER — Other Ambulatory Visit: Payer: Self-pay | Admitting: Family Medicine

## 2022-06-24 DIAGNOSIS — E1165 Type 2 diabetes mellitus with hyperglycemia: Secondary | ICD-10-CM

## 2022-06-24 NOTE — Telephone Encounter (Signed)
Courtesy refill  

## 2022-06-27 DIAGNOSIS — E119 Type 2 diabetes mellitus without complications: Secondary | ICD-10-CM | POA: Diagnosis not present

## 2022-06-27 DIAGNOSIS — Z79899 Other long term (current) drug therapy: Secondary | ICD-10-CM | POA: Diagnosis not present

## 2022-06-30 DIAGNOSIS — Z6826 Body mass index (BMI) 26.0-26.9, adult: Secondary | ICD-10-CM | POA: Diagnosis not present

## 2022-06-30 DIAGNOSIS — Z9181 History of falling: Secondary | ICD-10-CM | POA: Diagnosis not present

## 2022-06-30 DIAGNOSIS — G8929 Other chronic pain: Secondary | ICD-10-CM | POA: Diagnosis not present

## 2022-06-30 DIAGNOSIS — E559 Vitamin D deficiency, unspecified: Secondary | ICD-10-CM | POA: Diagnosis not present

## 2022-06-30 DIAGNOSIS — Z79899 Other long term (current) drug therapy: Secondary | ICD-10-CM | POA: Diagnosis not present

## 2022-06-30 DIAGNOSIS — E119 Type 2 diabetes mellitus without complications: Secondary | ICD-10-CM | POA: Diagnosis not present

## 2022-06-30 DIAGNOSIS — M25551 Pain in right hip: Secondary | ICD-10-CM | POA: Diagnosis not present

## 2022-07-04 DIAGNOSIS — Z79899 Other long term (current) drug therapy: Secondary | ICD-10-CM | POA: Diagnosis not present

## 2022-07-11 ENCOUNTER — Other Ambulatory Visit: Payer: Self-pay | Admitting: Family Medicine

## 2022-07-11 DIAGNOSIS — E1165 Type 2 diabetes mellitus with hyperglycemia: Secondary | ICD-10-CM

## 2022-07-11 NOTE — Therapy (Unsigned)
OUTPATIENT PHYSICAL THERAPY THORACOLUMBAR EVALUATION   Patient Name: Curtis Williamson MRN: [id number] DOB:Feb 12, 1961, 62 y.o., male Today's Date: 07/13/2022  END OF SESSION:  PT End of Session - 07/13/22 1303     Visit Number 1    Number of Visits 8    Date for PT Re-Evaluation 09/07/22    Authorization Type  MCD    PT Start Time 1045    PT Stop Time 1130    PT Time Calculation (min) 45 min    Activity Tolerance Patient tolerated treatment well    Behavior During Therapy [facility] for tasks assessed/performed             Past Medical History:  Diagnosis Date   Chronic kidney disease    only has one kidney   Diabetes mellitus without complication ([place])    Essential hypertension    Hyperlipidemia    Hypertension    Phreesia 01/13/2020   Past Surgical History:  Procedure Laterality Date   KIDNEY DONATION     MANDIBLE FRACTURE SURGERY     ROTATOR CUFF REPAIR Right    Patient Active Problem List   Diagnosis Date Noted   Low back pain 10/28/2020   Pain due to onychomycosis of toenails of both feet 10/09/2020   Hyperlipidemia 09/22/2020   Thickened nails 09/22/2020   Insomnia 09/22/2020   Acute pain of left shoulder 05/19/2020   Tobacco dependence 03/26/2019   Essential hypertension 03/26/2019   Type 2 diabetes mellitus without complication, without long-term current use of insulin ([place]) 03/26/2019   Aneurysm, cerebral, nonruptured 03/26/2019    PCP: Dorna Mai, MD   REFERRING PROVIDER: Starr Lake, PA-C   REFERRING DIAG: M54.2 (ICD-10-CM) - Cervicalgia  Rationale for Evaluation and Treatment: Rehabilitation  THERAPY DIAG:    ONSET DATE: 11/21  SUBJECTIVE:                                                                                                                                                                                           SUBJECTIVE STATEMENT: Relates a history of chronic neck back and shoulder pain ongoing since 11/21.   Has had previous PT and responded well.  Has had B RC repairs.    PERTINENT HISTORY:    PAIN:  Are you having pain? Yes: NPRS scale: "15"/10 Pain location: low back, neck, L shoulder Pain description: ache Aggravating factors: activity Relieving factors: pain meds , heat, massage  PRECAUTIONS: None  WEIGHT BEARING RESTRICTIONS: No  FALLS:  Has patient fallen in last 6 months? No  OCCUPATION: disabled  PLOF: Independent  PATIENT GOALS: To find some relief of symptoms  NEXT  MD VISIT: 07/14/22  OBJECTIVE:   DIAGNOSTIC FINDINGS:  None available  PATIENT SURVEYS:  Modified Oswestry 30/50(60% perceived disability)   SCREENING FOR RED FLAGS: negative   MUSCLE LENGTH: Hamstrings: Right 60 deg; Left 60 deg Thomas test: PKB restricted B  POSTURE: rounded shoulders and forward head  PALPATION: Deferred   LUMBAR ROM: Deferred due to time constraints  AROM eval  Flexion   Extension   Right lateral flexion   Left lateral flexion   Right rotation   Left rotation    (Blank rows = not tested)  LOWER EXTREMITY ROM:   WFL for gait and transfers  Active  Right eval Left eval  Hip flexion    Hip extension    Hip abduction    Hip adduction    Hip internal rotation    Hip external rotation    Knee flexion    Knee extension    Ankle dorsiflexion    Ankle plantarflexion    Ankle inversion    Ankle eversion     (Blank rows = not tested)  LOWER EXTREMITY MMT:    MMT Right eval Left eval  Hip flexion 4 4  Hip extension    Hip abduction 4 4  Hip adduction    Hip internal rotation    Hip external rotation    Knee flexion 4 4  Knee extension 4 4  Ankle dorsiflexion    Ankle plantarflexion 4 4  Ankle inversion    Ankle eversion     (Blank rows = not tested)  LUMBAR SPECIAL TESTS:  Straight leg raise test: Negative, Slump test: Positive, and FABER test: Negative positive R slump  FUNCTIONAL TESTS:  5 times sit to stand: 25s arms crossed  CERVICAL  ROM:   Active ROM A/PROM (deg) eval  Flexion 75%  Extension 75%  Right lateral flexion 50%  Left lateral flexion 25%  Right rotation 50%P!  Left rotation 90%   (Blank rows = not tested)  UPPER EXTREMITY ROM:  Active ROM Right eval Left eval  Shoulder flexion 135d 90d  Shoulder extension WNL WNL  Shoulder abduction 150d 100d  Shoulder adduction    Shoulder extension    Shoulder internal rotation    Shoulder external rotation    Elbow flexion    Elbow extension    Wrist flexion    Wrist extension    Wrist ulnar deviation    Wrist radial deviation    Wrist pronation    Wrist supination     (Blank rows = not tested)   GAIT: Distance walked: 76ft x2 Assistive device utilized: None Level of assistance: Complete Independence Comments: slow cadence antalgic gait  TODAY'S TREATMENT:                                                                                                                              DATE: 07/13/22    PATIENT EDUCATION:  Education details: Discussed eval findings, rehab rationale  and POC and patient is in agreement  Person educated: Patient Education method: Explanation Education comprehension: verbalized understanding and needs further education  HOME EXERCISE PROGRAM: Access Code: [facility] URL: [email or web address] Date: 07/13/2022 Prepared by: Sharlynn Oliphant  Exercises - Sit to Stand Without Arm Support  - 2 x daily - 5 x weekly - 2 sets - 5 reps  ASSESSMENT:  CLINICAL IMPRESSION: Patient is a 62 y.o. male who was seen today for physical therapy evaluation and treatment for chronic neck, back and L shoulder pain.  He presents with Limitations in L shoulder A/PROM, LE and core strength deficits, positive neural irritation on R, decreased cervical mobility and perceived disability of 60%.  Mobility restrictions appear soft tissue in nature.  Patient would benefit from several weeks of PT to establish a home based program to  improve mobility and function.  Pain levels and control to be addressed by pain management.  OBJECTIVE IMPAIRMENTS: Abnormal gait, decreased activity tolerance, decreased endurance, decreased mobility, difficulty walking, decreased ROM, decreased strength, impaired flexibility, impaired UE functional use, improper body mechanics, postural dysfunction, and pain.   ACTIVITY LIMITATIONS: carrying, lifting, bending, sitting, standing, squatting, sleeping, and stairs  PERSONAL FACTORS: Age, Fitness, Past/current experiences, Time since onset of injury/illness/exacerbation, and B RC repairs  are also affecting patient's functional outcome.   REHAB POTENTIAL: Fair based on chronic pain and time since onset  CLINICAL DECISION MAKING: Evolving/moderate complexity  EVALUATION COMPLEXITY: Moderate   GOALS: Goals reviewed with patient? No  SHORT TERM GOALS: Target date: 07/27/2022  Patient to demonstrate independence in HEP  Baseline: [id number] Goal status: INITIAL  2.  Assess trunk mobility Baseline: TBD Goal status: INITIAL    LONG TERM GOALS: Target date: 08/10/2022  Decrease ODI score to 22/50 Baseline: 30/50 Goal status: INITIAL  2.  Increase BLE strength to 4+/5 Baseline:  MMT Right eval Left eval  Hip flexion 4 4  Hip extension    Hip abduction 4 4  Hip adduction    Hip internal rotation    Hip external rotation    Knee flexion 4 4  Knee extension 4 4  Ankle dorsiflexion    Ankle plantarflexion 4 4   Goal status: INITIAL  3.  Increase cervical ROM to 75% globally Baseline:  Active ROM A/PROM (deg) eval  Flexion 75%  Extension 75%  Right lateral flexion 50%  Left lateral flexion 25%  Right rotation 50%P!  Left rotation 90%   Goal status: INITIAL  4.  Increase B shoulder flexion and abduction to 160d Baseline:  Active ROM Right eval Left eval  Shoulder flexion 135d 90d  Shoulder extension WNL WNL  Shoulder abduction 150d 100d   Goal status:  INITIAL    PLAN:  PT FREQUENCY: 2x/week  PT DURATION: 4 weeks  PLANNED INTERVENTIONS: Therapeutic exercises, Therapeutic activity, Neuromuscular re-education, Balance training, Gait training, Patient/Family education, Self Care, Joint mobilization, Dry Needling, Manual therapy, and Re-evaluation.  PLAN FOR NEXT SESSION: HEP review and update, ROM and flexibility tasks for all affected regions, posture exercises, core and LE strengthening   Lanice Shirts, PT 07/13/2022, 1:05 PM   Check all possible CPT codes: [phone] - PT Re-evaluation, 97110- Therapeutic Exercise, [phone]- Neuro Re-education, [phone] - Gait Training, [phone] - Manual Therapy, [phone] - Therapeutic Activities, and [phone] - Self Care    Check all conditions that are expected to impact treatment: None of these apply   If treatment provided at initial evaluation, no treatment charged due to lack of authorization.

## 2022-07-13 ENCOUNTER — Ambulatory Visit: Payer: Medicaid Other | Attending: Physician Assistant

## 2022-07-13 DIAGNOSIS — M545 Low back pain, unspecified: Secondary | ICD-10-CM | POA: Insufficient documentation

## 2022-07-13 DIAGNOSIS — M25612 Stiffness of left shoulder, not elsewhere classified: Secondary | ICD-10-CM | POA: Diagnosis present

## 2022-07-13 DIAGNOSIS — M6281 Muscle weakness (generalized): Secondary | ICD-10-CM | POA: Diagnosis present

## 2022-07-13 DIAGNOSIS — M542 Cervicalgia: Secondary | ICD-10-CM | POA: Insufficient documentation

## 2022-07-13 DIAGNOSIS — G8929 Other chronic pain: Secondary | ICD-10-CM | POA: Diagnosis present

## 2022-07-14 DIAGNOSIS — I10 Essential (primary) hypertension: Secondary | ICD-10-CM | POA: Diagnosis not present

## 2022-07-14 DIAGNOSIS — G8929 Other chronic pain: Secondary | ICD-10-CM | POA: Diagnosis not present

## 2022-07-14 DIAGNOSIS — M545 Low back pain, unspecified: Secondary | ICD-10-CM | POA: Diagnosis not present

## 2022-07-14 DIAGNOSIS — Z79899 Other long term (current) drug therapy: Secondary | ICD-10-CM | POA: Diagnosis not present

## 2022-07-14 DIAGNOSIS — E559 Vitamin D deficiency, unspecified: Secondary | ICD-10-CM | POA: Diagnosis not present

## 2022-07-14 DIAGNOSIS — M25551 Pain in right hip: Secondary | ICD-10-CM | POA: Diagnosis not present

## 2022-07-14 DIAGNOSIS — Z9181 History of falling: Secondary | ICD-10-CM | POA: Diagnosis not present

## 2022-07-14 DIAGNOSIS — Z6826 Body mass index (BMI) 26.0-26.9, adult: Secondary | ICD-10-CM | POA: Diagnosis not present

## 2022-07-14 DIAGNOSIS — M5430 Sciatica, unspecified side: Secondary | ICD-10-CM | POA: Diagnosis not present

## 2022-07-14 DIAGNOSIS — E119 Type 2 diabetes mellitus without complications: Secondary | ICD-10-CM | POA: Diagnosis not present

## 2022-07-19 ENCOUNTER — Other Ambulatory Visit: Payer: Self-pay | Admitting: Family Medicine

## 2022-07-19 DIAGNOSIS — E1165 Type 2 diabetes mellitus with hyperglycemia: Secondary | ICD-10-CM

## 2022-07-21 DIAGNOSIS — Z79899 Other long term (current) drug therapy: Secondary | ICD-10-CM | POA: Diagnosis not present

## 2022-07-27 ENCOUNTER — Ambulatory Visit: Payer: Medicaid Other | Attending: Physician Assistant | Admitting: Physical Therapy

## 2022-07-27 ENCOUNTER — Encounter: Payer: Self-pay | Admitting: Physical Therapy

## 2022-07-27 DIAGNOSIS — G8929 Other chronic pain: Secondary | ICD-10-CM | POA: Diagnosis present

## 2022-07-27 DIAGNOSIS — M6281 Muscle weakness (generalized): Secondary | ICD-10-CM | POA: Diagnosis present

## 2022-07-27 DIAGNOSIS — M25612 Stiffness of left shoulder, not elsewhere classified: Secondary | ICD-10-CM | POA: Diagnosis present

## 2022-07-27 DIAGNOSIS — M542 Cervicalgia: Secondary | ICD-10-CM | POA: Diagnosis present

## 2022-07-27 DIAGNOSIS — E119 Type 2 diabetes mellitus without complications: Secondary | ICD-10-CM | POA: Diagnosis not present

## 2022-07-27 DIAGNOSIS — M545 Low back pain, unspecified: Secondary | ICD-10-CM | POA: Insufficient documentation

## 2022-07-27 NOTE — Therapy (Signed)
OUTPATIENT PHYSICAL THERAPY TREATMENT NOTE   Patient Name: Curtis Williamson MRN: [id number] DOB:June 18, 1960, 62 y.o., male Today's Date: 07/27/2022  PCP: Georganna Skeans, MD    REFERRING PROVIDER: Jamey Reas, PA-C    PT End of Session - 07/27/22 [phone]     Visit Number 2    Number of Visits 8    Date for PT Re-Evaluation 09/07/22    Authorization Type [place] MCD    PT Start Time 0830    PT Stop Time 0915    PT Time Calculation (min) 45 min    Activity Tolerance Patient tolerated treatment well    Behavior During Therapy [facility] for tasks assessed/performed             Past Medical History:  Diagnosis Date   Chronic kidney disease    only has one kidney   Diabetes mellitus without complication    Essential hypertension    Hyperlipidemia    Hypertension    Phreesia 01/13/2020   Past Surgical History:  Procedure Laterality Date   KIDNEY DONATION     MANDIBLE FRACTURE SURGERY     ROTATOR CUFF REPAIR Right    Patient Active Problem List   Diagnosis Date Noted   Low back pain 10/28/2020   Pain due to onychomycosis of toenails of both feet 10/09/2020   Hyperlipidemia 09/22/2020   Thickened nails 09/22/2020   Insomnia 09/22/2020   Acute pain of left shoulder 05/19/2020   Tobacco dependence 03/26/2019   Essential hypertension 03/26/2019   Type 2 diabetes mellitus without complication, without long-term current use of insulin 03/26/2019   Aneurysm, cerebral, nonruptured 03/26/2019    THERAPY DIAG:  Chronic bilateral low back pain without sciatica  Stiffness of left shoulder, not elsewhere classified  Cervicalgia  Muscle weakness (generalized)   Rationale for Evaluation and Treatment Rehabilitation  REFERRING DIAG: M54.2 (ICD-10-CM) - Cervicalgia   PERTINENT HISTORY:      PRECAUTIONS/RESTRICTIONS:   none  SUBJECTIVE:  Pt reports he is in more pain than typical this morning @ 7/10  PAIN:  Are you having pain? Yes: NPRS scale: "15"/10 Pain  location: low back, neck, L shoulder Pain description: ache Aggravating factors: activity Relieving factors: pain meds , heat, massage  OBJECTIVE: (objective measures completed at initial evaluation unless otherwise dated)  DIAGNOSTIC FINDINGS:  None available   PATIENT SURVEYS:  Modified Oswestry 30/50(60% perceived disability)    SCREENING FOR RED FLAGS: negative     MUSCLE LENGTH: Hamstrings: Right 60 deg; Left 60 deg Thomas test: PKB restricted B   POSTURE: rounded shoulders and forward head   PALPATION: Deferred    LUMBAR ROM: Deferred due to time constraints   AROM eval  Flexion    Extension    Right lateral flexion    Left lateral flexion    Right rotation    Left rotation     (Blank rows = not tested)   LOWER EXTREMITY ROM:   WFL for gait and transfers   Active  Right eval Left eval  Hip flexion      Hip extension      Hip abduction      Hip adduction      Hip internal rotation      Hip external rotation      Knee flexion      Knee extension      Ankle dorsiflexion      Ankle plantarflexion      Ankle inversion      Ankle eversion       (  Blank rows = not tested)   LOWER EXTREMITY MMT:     MMT Right eval Left eval  Hip flexion 4 4  Hip extension      Hip abduction 4 4  Hip adduction      Hip internal rotation      Hip external rotation      Knee flexion 4 4  Knee extension 4 4  Ankle dorsiflexion      Ankle plantarflexion 4 4  Ankle inversion      Ankle eversion       (Blank rows = not tested)   LUMBAR SPECIAL TESTS:  Straight leg raise test: Negative, Slump test: Positive, and FABER test: Negative positive R slump   FUNCTIONAL TESTS:  5 times sit to stand: 25s arms crossed   CERVICAL ROM:    Active ROM A/PROM (deg) eval  Flexion 75%  Extension 75%  Right lateral flexion 50%  Left lateral flexion 25%  Right rotation 50%P!  Left rotation 90%   (Blank rows = not tested)  UPPER EXTREMITY ROM:   Active ROM Right eval  Left eval  Shoulder flexion 135d 90d  Shoulder extension WNL WNL  Shoulder abduction 150d 100d  Shoulder adduction      Shoulder extension      Shoulder internal rotation      Shoulder external rotation      Elbow flexion      Elbow extension      Wrist flexion      Wrist extension      Wrist ulnar deviation      Wrist radial deviation      Wrist pronation      Wrist supination       (Blank rows = not tested)    GAIT: Distance walked: 8775ft x2 Assistive device utilized: None Level of assistance: Complete Independence Comments: slow cadence antalgic gait   TODAY'S TREATMENT:                                                                                                                              DATE: 07/13/22      PATIENT EDUCATION:  Education details: Discussed eval findings, rehab rationale and POC and patient is in agreement  Person educated: Patient Education method: Explanation Education comprehension: verbalized understanding and needs further education   HOME EXERCISE PROGRAM: Access Code: [email or web address] URL: [email or web address] Date: 07/13/2022 Prepared by: Gustavus BryantJeffrey Ziemba   Exercises - Sit to Stand Without Arm Support  - 2 x daily - 5 x weekly - 2 sets - 5 reps    TREATMENT 4/10:  Therapeutic Exercise: - nu-step L5 6167m while taking subjective and planning session with patient - chin tuck in supine - 2x10 - L/R x10 ea - horizontal abd - RTB - 3x10 - supine diagonals - RTB - 2x10 - PPT - 2x10 - 3'' hold - hip adduction squeeze - 5'' hold 2x10 - Alternating supine clam with abdominal contraction -  RTB - 2x10 ea - small arc bridge  Manual Therapy: - Sub occipital release - STM bil UT and sub occipitals - STM to all listed muscles following TDN  Trigger Point Dry-Needling  Treatment instructions: Expect mild to moderate muscle soreness. S/S of pneumothorax if dry needled over a lung field, and to seek immediate medical attention should they  occur. Patient verbalized understanding of these instructions and education.  Patient Consent Given: Yes Education handout provided: No Muscles treated: R sub occipitals, R UT Electrical stimulation performed: No Parameters: N/A Treatment response/outcome: twitch   ASSESSMENT:   CLINICAL IMPRESSION: Justen tolerated session well with no adverse reaction.  Initiated gentle cervical and periscapular strengthening as well as gentle hip/core work.  Pt shows significant strength and endurance deficit throughout.  Pt reports reduction in sxs following manual therapy.    OBJECTIVE IMPAIRMENTS: Abnormal gait, decreased activity tolerance, decreased endurance, decreased mobility, difficulty walking, decreased ROM, decreased strength, impaired flexibility, impaired UE functional use, improper body mechanics, postural dysfunction, and pain.    ACTIVITY LIMITATIONS: carrying, lifting, bending, sitting, standing, squatting, sleeping, and stairs   PERSONAL FACTORS: Age, Fitness, Past/current experiences, Time since onset of injury/illness/exacerbation, and B RC repairs  are also affecting patient's functional outcome.    REHAB POTENTIAL: Fair based on chronic pain and time since onset   CLINICAL DECISION MAKING: Evolving/moderate complexity   EVALUATION COMPLEXITY: Moderate     GOALS: Goals reviewed with patient? No   SHORT TERM GOALS: Target date: 07/27/2022   Patient to demonstrate independence in HEP  Baseline: 88WLXHDH Goal status: MET   2.  Assess trunk mobility Baseline: TBD 4/10: limited all planes Goal status: MET       LONG TERM GOALS: Target date: 08/10/2022   Decrease ODI score to 22/50 Baseline: 30/50 Goal status: INITIAL   2.  Increase BLE strength to 4+/5 Baseline:  MMT Right eval Left eval  Hip flexion 4 4  Hip extension      Hip abduction 4 4  Hip adduction      Hip internal rotation      Hip external rotation      Knee flexion 4 4  Knee extension 4  4  Ankle dorsiflexion      Ankle plantarflexion 4 4    Goal status: INITIAL   3.  Increase cervical ROM to 75% globally Baseline:  Active ROM A/PROM (deg) eval  Flexion 75%  Extension 75%  Right lateral flexion 50%  Left lateral flexion 25%  Right rotation 50%P!  Left rotation 90%    Goal status: INITIAL   4.  Increase B shoulder flexion and abduction to 160d Baseline:  Active ROM Right eval Left eval  Shoulder flexion 135d 90d  Shoulder extension WNL WNL  Shoulder abduction 150d 100d    Goal status: INITIAL       PLAN:   PT FREQUENCY: 2x/week   PT DURATION: 4 weeks   PLANNED INTERVENTIONS: Therapeutic exercises, Therapeutic activity, Neuromuscular re-education, Balance training, Gait training, Patient/Family education, Self Care, Joint mobilization, Dry Needling, Manual therapy, and Re-evaluation.   PLAN FOR NEXT SESSION: HEP review and update, ROM and flexibility tasks for all affected regions, posture exercises, core and LE strengthening   Kimberlee Nearing Kassem Kibbe PT 07/27/2022, 9:19 AM

## 2022-07-28 DIAGNOSIS — M5430 Sciatica, unspecified side: Secondary | ICD-10-CM | POA: Diagnosis not present

## 2022-07-28 DIAGNOSIS — Z6826 Body mass index (BMI) 26.0-26.9, adult: Secondary | ICD-10-CM | POA: Diagnosis not present

## 2022-07-28 DIAGNOSIS — M545 Low back pain, unspecified: Secondary | ICD-10-CM | POA: Diagnosis not present

## 2022-07-28 DIAGNOSIS — R110 Nausea: Secondary | ICD-10-CM | POA: Diagnosis not present

## 2022-07-28 DIAGNOSIS — E119 Type 2 diabetes mellitus without complications: Secondary | ICD-10-CM | POA: Diagnosis not present

## 2022-07-28 DIAGNOSIS — M25551 Pain in right hip: Secondary | ICD-10-CM | POA: Diagnosis not present

## 2022-07-28 DIAGNOSIS — E559 Vitamin D deficiency, unspecified: Secondary | ICD-10-CM | POA: Diagnosis not present

## 2022-07-28 DIAGNOSIS — I10 Essential (primary) hypertension: Secondary | ICD-10-CM | POA: Diagnosis not present

## 2022-07-28 DIAGNOSIS — Z79899 Other long term (current) drug therapy: Secondary | ICD-10-CM | POA: Diagnosis not present

## 2022-07-28 DIAGNOSIS — Z9181 History of falling: Secondary | ICD-10-CM | POA: Diagnosis not present

## 2022-08-02 DIAGNOSIS — Z79899 Other long term (current) drug therapy: Secondary | ICD-10-CM | POA: Diagnosis not present

## 2022-08-03 ENCOUNTER — Ambulatory Visit: Payer: Medicaid Other

## 2022-08-03 DIAGNOSIS — M6281 Muscle weakness (generalized): Secondary | ICD-10-CM

## 2022-08-03 DIAGNOSIS — M542 Cervicalgia: Secondary | ICD-10-CM

## 2022-08-03 DIAGNOSIS — M545 Low back pain, unspecified: Secondary | ICD-10-CM | POA: Diagnosis not present

## 2022-08-03 DIAGNOSIS — G8929 Other chronic pain: Secondary | ICD-10-CM

## 2022-08-03 NOTE — Therapy (Signed)
OUTPATIENT PHYSICAL THERAPY TREATMENT NOTE   Patient Name: Curtis Williamson MRN: [id number] DOB:May 29, 1960, 62 y.o., male Today's Date: 08/03/2022  PCP: Georganna Skeans, MD    REFERRING PROVIDER: Jamey Reas, PA-C    PT End of Session - 08/03/22 [id number]     Visit Number 3    Number of Visits 8    Date for PT Re-Evaluation 09/07/22    Authorization Type [place] [employer]    PT Start Time 0830    PT Stop Time 0910    PT Time Calculation (min) 40 min    Activity Tolerance Patient tolerated treatment well    Behavior During Therapy [facility] for tasks assessed/performed              Past Medical History:  Diagnosis Date   Chronic kidney disease    only has one kidney   Diabetes mellitus without complication    Essential hypertension    Hyperlipidemia    Hypertension    Phreesia 01/13/2020   Past Surgical History:  Procedure Laterality Date   KIDNEY DONATION     MANDIBLE FRACTURE SURGERY     ROTATOR CUFF REPAIR Right    Patient Active Problem List   Diagnosis Date Noted   Low back pain 10/28/2020   Pain due to onychomycosis of toenails of both feet 10/09/2020   Hyperlipidemia 09/22/2020   Thickened nails 09/22/2020   Insomnia 09/22/2020   Acute pain of left shoulder 05/19/2020   Tobacco dependence 03/26/2019   Essential hypertension 03/26/2019   Type 2 diabetes mellitus without complication, without long-term current use of insulin 03/26/2019   Aneurysm, cerebral, nonruptured 03/26/2019    THERAPY DIAG:  Chronic bilateral low back pain without sciatica  Cervicalgia  Muscle weakness (generalized)   Rationale for Evaluation and Treatment Rehabilitation  REFERRING DIAG: M54.2 (ICD-10-CM) - Cervicalgia   PERTINENT HISTORY:      PRECAUTIONS/RESTRICTIONS:   none  SUBJECTIVE:  Patient reports his lower back and neck are hurting a lot today. He states that  PAIN:  Are you having pain? Yes: NPRS scale: 7/10 Pain location: low back, neck, L shoulder Pain  description: ache Aggravating factors: activity Relieving factors: pain meds , heat, massage  OBJECTIVE: (objective measures completed at initial evaluation unless otherwise dated)  DIAGNOSTIC FINDINGS:  None available   PATIENT SURVEYS:  Modified Oswestry 30/50(60% perceived disability)    SCREENING FOR RED FLAGS: negative     MUSCLE LENGTH: Hamstrings: Right 60 deg; Left 60 deg Thomas test: PKB restricted B   POSTURE: rounded shoulders and forward head   PALPATION: Deferred    LUMBAR ROM: Deferred due to time constraints   AROM eval  Flexion    Extension    Right lateral flexion    Left lateral flexion    Right rotation    Left rotation     (Blank rows = not tested)   LOWER EXTREMITY ROM:   WFL for gait and transfers   Active  Right eval Left eval  Hip flexion      Hip extension      Hip abduction      Hip adduction      Hip internal rotation      Hip external rotation      Knee flexion      Knee extension      Ankle dorsiflexion      Ankle plantarflexion      Ankle inversion      Ankle eversion       (  Blank rows = not tested)   LOWER EXTREMITY MMT:     MMT Right eval Left eval  Hip flexion 4 4  Hip extension      Hip abduction 4 4  Hip adduction      Hip internal rotation      Hip external rotation      Knee flexion 4 4  Knee extension 4 4  Ankle dorsiflexion      Ankle plantarflexion 4 4  Ankle inversion      Ankle eversion       (Blank rows = not tested)   LUMBAR SPECIAL TESTS:  Straight leg raise test: Negative, Slump test: Positive, and FABER test: Negative positive R slump   FUNCTIONAL TESTS:  5 times sit to stand: 25s arms crossed   CERVICAL ROM:    Active ROM A/PROM (deg) eval  Flexion 75%  Extension 75%  Right lateral flexion 50%  Left lateral flexion 25%  Right rotation 50%P!  Left rotation 90%   (Blank rows = not tested)  UPPER EXTREMITY ROM:   Active ROM Right eval Left eval  Shoulder flexion 135d 90d   Shoulder extension WNL WNL  Shoulder abduction 150d 100d  Shoulder adduction      Shoulder extension      Shoulder internal rotation      Shoulder external rotation      Elbow flexion      Elbow extension      Wrist flexion      Wrist extension      Wrist ulnar deviation      Wrist radial deviation      Wrist pronation      Wrist supination       (Blank rows = not tested)    GAIT: Distance walked: 68ft x2 Assistive device utilized: None Level of assistance: Complete Independence Comments: slow cadence antalgic gait     PATIENT EDUCATION:  Education details: Discussed eval findings, rehab rationale and POC and patient is in agreement  Person educated: Patient Education method: Explanation Education comprehension: verbalized understanding and needs further education   HOME EXERCISE PROGRAM: Access Code: [facility] URL: [email or web address] Date: 07/13/2022 Prepared by: Gustavus Bryant   Exercises - Sit to Stand Without Arm Support  - 2 x daily - 5 x weekly - 2 sets - 5 reps    TREATMENT 4/17: Therapeutic Exercise: - nu-step L6 50m while taking subjective and planning session with patient - horizontal abd - RTB - 3x10 - supine diagonals - RTB - 2x10 - PPT - 2x10 - 3'' hold - hip adduction squeeze - 5'' hold 2x10 - Alternating supine clam with abdominal contraction - RTB - 2x10 ea - small arc bridge 2x10 - figure 4 piriformis stretch 2x30" BIL - supine sciatic nerve glide x20 Rt - modified thomas stretch EOM x1' BIL - LTR x10 BIL  Manual Therapy: - Sub occipital release - STM bil UT and sub occipitals  TREATMENT 4/10:  Therapeutic Exercise: - nu-step L5 30m while taking subjective and planning session with patient - chin tuck in supine - 2x10 - L/R x10 ea - horizontal abd - RTB - 3x10 - supine diagonals - RTB - 2x10 - PPT - 2x10 - 3'' hold - hip adduction squeeze - 5'' hold 2x10 - Alternating supine clam with abdominal contraction - RTB - 2x10  ea - small arc bridge  Manual Therapy: - Sub occipital release - STM bil UT and sub occipitals - STM to all listed muscles  following TDN  Trigger Point Dry-Needling  Treatment instructions: Expect mild to moderate muscle soreness. S/S of pneumothorax if dry needled over a lung field, and to seek immediate medical attention should they occur. Patient verbalized understanding of these instructions and education.  Patient Consent Given: Yes Education handout provided: No Muscles treated: R sub occipitals, R UT Electrical stimulation performed: No Parameters: N/A Treatment response/outcome: twitch   ASSESSMENT:   CLINICAL IMPRESSION: Patient presents to PT reporting increased pain from his lower back to his neck and shoulders and reports that the TPDN was helpful last session. He is interested in TPDN with e-stim for the lower back next session. Session today continued to focus on periscapular, hip, and core strengthening with incorporation or more stretches today to moderate effect, patient reports continued pain at end of session. Patient continues to benefit from skilled PT services and should be progressed as able to improve functional independence.     OBJECTIVE IMPAIRMENTS: Abnormal gait, decreased activity tolerance, decreased endurance, decreased mobility, difficulty walking, decreased ROM, decreased strength, impaired flexibility, impaired UE functional use, improper body mechanics, postural dysfunction, and pain.    ACTIVITY LIMITATIONS: carrying, lifting, bending, sitting, standing, squatting, sleeping, and stairs   PERSONAL FACTORS: Age, Fitness, Past/current experiences, Time since onset of injury/illness/exacerbation, and B RC repairs  are also affecting patient's functional outcome.    REHAB POTENTIAL: Fair based on chronic pain and time since onset   CLINICAL DECISION MAKING: Evolving/moderate complexity   EVALUATION COMPLEXITY: Moderate     GOALS: Goals reviewed  with patient? No   SHORT TERM GOALS: Target date: 07/27/2022   Patient to demonstrate independence in HEP  Baseline: [id number] Goal status: MET   2.  Assess trunk mobility Baseline: TBD 4/10: limited all planes Goal status: MET       LONG TERM GOALS: Target date: 08/10/2022   Decrease ODI score to 22/50 Baseline: 30/50 Goal status: INITIAL   2.  Increase BLE strength to 4+/5 Baseline:  MMT Right eval Left eval  Hip flexion 4 4  Hip extension      Hip abduction 4 4  Hip adduction      Hip internal rotation      Hip external rotation      Knee flexion 4 4  Knee extension 4 4  Ankle dorsiflexion      Ankle plantarflexion 4 4    Goal status: INITIAL   3.  Increase cervical ROM to 75% globally Baseline:  Active ROM A/PROM (deg) eval  Flexion 75%  Extension 75%  Right lateral flexion 50%  Left lateral flexion 25%  Right rotation 50%P!  Left rotation 90%    Goal status: INITIAL   4.  Increase B shoulder flexion and abduction to 160d Baseline:  Active ROM Right eval Left eval  Shoulder flexion 135d 90d  Shoulder extension WNL WNL  Shoulder abduction 150d 100d    Goal status: INITIAL       PLAN:   PT FREQUENCY: 2x/week   PT DURATION: 4 weeks   PLANNED INTERVENTIONS: Therapeutic exercises, Therapeutic activity, Neuromuscular re-education, Balance training, Gait training, Patient/Family education, Self Care, Joint mobilization, Dry Needling, Manual therapy, and Re-evaluation.   PLAN FOR NEXT SESSION: HEP review and update, ROM and flexibility tasks for all affected regions, posture exercises, core and LE strengthening   Berta Minor PTA 08/03/2022, 9:11 AM

## 2022-08-05 ENCOUNTER — Encounter: Payer: Self-pay | Admitting: Physical Therapy

## 2022-08-05 ENCOUNTER — Ambulatory Visit: Payer: Medicaid Other | Admitting: Physical Therapy

## 2022-08-05 DIAGNOSIS — M6281 Muscle weakness (generalized): Secondary | ICD-10-CM

## 2022-08-05 DIAGNOSIS — M542 Cervicalgia: Secondary | ICD-10-CM

## 2022-08-05 DIAGNOSIS — M545 Low back pain, unspecified: Secondary | ICD-10-CM | POA: Diagnosis not present

## 2022-08-05 NOTE — Therapy (Signed)
OUTPATIENT PHYSICAL THERAPY TREATMENT NOTE   Patient Name: Curtis Williamson MRN: [id number] DOB:10/10/1960, 62 y.o., male Today's Date: 08/05/2022  PCP: Georganna Skeans, MD    REFERRING PROVIDER: Jamey Reas, PA-C    PT End of Session - 08/05/22 [phone]     Visit Number 4    Number of Visits 8    Date for PT Re-Evaluation 09/07/22    Authorization Type [facility]    PT Start Time 0830    PT Stop Time 0911    PT Time Calculation (min) 41 min    Activity Tolerance Patient tolerated treatment well    Behavior During Therapy [employer] for tasks assessed/performed              Past Medical History:  Diagnosis Date   Chronic kidney disease    only has one kidney   Diabetes mellitus without complication    Essential hypertension    Hyperlipidemia    Hypertension    Phreesia 01/13/2020   Past Surgical History:  Procedure Laterality Date   KIDNEY DONATION     MANDIBLE FRACTURE SURGERY     ROTATOR CUFF REPAIR Right    Patient Active Problem List   Diagnosis Date Noted   Low back pain 10/28/2020   Pain due to onychomycosis of toenails of both feet 10/09/2020   Hyperlipidemia 09/22/2020   Thickened nails 09/22/2020   Insomnia 09/22/2020   Acute pain of left shoulder 05/19/2020   Tobacco dependence 03/26/2019   Essential hypertension 03/26/2019   Type 2 diabetes mellitus without complication, without long-term current use of insulin 03/26/2019   Aneurysm, cerebral, nonruptured 03/26/2019    THERAPY DIAG:  Chronic bilateral low back pain without sciatica  Cervicalgia  Muscle weakness (generalized)   Rationale for Evaluation and Treatment Rehabilitation  REFERRING DIAG: M54.2 (ICD-10-CM) - Cervicalgia   PERTINENT HISTORY:      PRECAUTIONS/RESTRICTIONS:   none  SUBJECTIVE:  Pt states that TDN is helpful and he would like to continue this today.  Overall he is seeing some improvement.  PAIN:  Are you having pain? Yes: NPRS scale: 5/10 Pain location:  low back, neck, L shoulder Pain description: ache Aggravating factors: activity Relieving factors: pain meds , heat, massage  OBJECTIVE: (objective measures completed at initial evaluation unless otherwise dated)  DIAGNOSTIC FINDINGS:  None available   PATIENT SURVEYS:  Modified Oswestry 30/50(60% perceived disability)    SCREENING FOR RED FLAGS: negative     MUSCLE LENGTH: Hamstrings: Right 60 deg; Left 60 deg Thomas test: PKB restricted B   POSTURE: rounded shoulders and forward head   PALPATION: Deferred    LUMBAR ROM: Deferred due to time constraints   AROM eval  Flexion    Extension    Right lateral flexion    Left lateral flexion    Right rotation    Left rotation     (Blank rows = not tested)   LOWER EXTREMITY ROM:   WFL for gait and transfers   Active  Right eval Left eval  Hip flexion      Hip extension      Hip abduction      Hip adduction      Hip internal rotation      Hip external rotation      Knee flexion      Knee extension      Ankle dorsiflexion      Ankle plantarflexion      Ankle inversion      Ankle  eversion       (Blank rows = not tested)   LOWER EXTREMITY MMT:     MMT Right eval Left eval  Hip flexion 4 4  Hip extension      Hip abduction 4 4  Hip adduction      Hip internal rotation      Hip external rotation      Knee flexion 4 4  Knee extension 4 4  Ankle dorsiflexion      Ankle plantarflexion 4 4  Ankle inversion      Ankle eversion       (Blank rows = not tested)   LUMBAR SPECIAL TESTS:  Straight leg raise test: Negative, Slump test: Positive, and FABER test: Negative positive R slump   FUNCTIONAL TESTS:  5 times sit to stand: 25s arms crossed   CERVICAL ROM:    Active ROM A/PROM (deg) eval  Flexion 75%  Extension 75%  Right lateral flexion 50%  Left lateral flexion 25%  Right rotation 50%P!  Left rotation 90%   (Blank rows = not tested)  UPPER EXTREMITY ROM:   Active ROM Right eval Left eval   Shoulder flexion 135d 90d  Shoulder extension WNL WNL  Shoulder abduction 150d 100d  Shoulder adduction      Shoulder extension      Shoulder internal rotation      Shoulder external rotation      Elbow flexion      Elbow extension      Wrist flexion      Wrist extension      Wrist ulnar deviation      Wrist radial deviation      Wrist pronation      Wrist supination       (Blank rows = not tested)    GAIT: Distance walked: 84ft x2 Assistive device utilized: None Level of assistance: Complete Independence Comments: slow cadence antalgic gait     PATIENT EDUCATION:  Education details: Discussed eval findings, rehab rationale and POC and patient is in agreement  Person educated: Patient Education method: Explanation Education comprehension: verbalized understanding and needs further education   HOME EXERCISE PROGRAM: Access Code: [facility] URL: [email or web address] Date: 07/13/2022 Prepared by: Gustavus Bryant   Exercises - Sit to Stand Without Arm Support  - 2 x daily - 5 x weekly - 2 sets - 5 reps     TREATMENT 4/19:  Therapeutic Exercise: - nu-step L7 88m while taking subjective and planning session with patient - chin tuck in supine - 2x10 - L/R x10 ea - scap setting in supine - 2x10 - horizontal abd - Blue TB - 2x10 - chest press - 7# ea - 2x10 - S/L clam - GTB - 2x10 - moderate arc bridge - 2x10 - plank from knees - 3x to fatigue  Manual therapy: Skilled palpation to identify trigger points for TDN STM to all listed muscles following TDN  Trigger Point Dry-Needling  Treatment instructions: Expect mild to moderate muscle soreness. S/S of pneumothorax if dry needled over a lung field, and to seek immediate medical attention should they occur. Patient verbalized understanding of these instructions and education.  Patient Consent Given: Yes Education handout provided: No Muscles treated: sub occipitals bil, UT bil, ~T9 - > L2 paraspinals  (estim) Electrical stimulation performed: Yes Parameters: 8 min low frequency - milli amps - low intensity; 0 min - micro amps - high frequency high intensity. Treatment response/outcome: pain reduction  TREATMENT 4/17: Therapeutic Exercise: -  nu-step L6 30m while taking subjective and planning session with patient - horizontal abd - RTB - 3x10 - supine diagonals - RTB - 2x10 - PPT - 2x10 - 3'' hold - hip adduction squeeze - 5'' hold 2x10 - Alternating supine clam with abdominal contraction - RTB - 2x10 ea - small arc bridge 2x10 - figure 4 piriformis stretch 2x30" BIL - supine sciatic nerve glide x20 Rt - modified thomas stretch EOM x1' BIL - LTR x10 BIL  Consider more direct R/C strengthening  Manual Therapy: - Sub occipital release - STM bil UT and sub occipitals  TREATMENT 4/10:  Therapeutic Exercise: - nu-step L5 51m while taking subjective and planning session with patient - chin tuck in supine - 2x10 - L/R x10 ea - horizontal abd - RTB - 3x10 - supine diagonals - RTB - 2x10 - PPT - 2x10 - 3'' hold - hip adduction squeeze - 5'' hold 2x10 - Alternating supine clam with abdominal contraction - RTB - 2x10 ea - small arc bridge  Manual Therapy: - Sub occipital release - STM bil UT and sub occipitals - STM to all listed muscles following TDN  Trigger Point Dry-Needling  Treatment instructions: Expect mild to moderate muscle soreness. S/S of pneumothorax if dry needled over a lung field, and to seek immediate medical attention should they occur. Patient verbalized understanding of these instructions and education.  Patient Consent Given: Yes Education handout provided: No Muscles treated: R sub occipitals, R UT Electrical stimulation performed: No Parameters: N/A Treatment response/outcome: twitch   ASSESSMENT:   CLINICAL IMPRESSION: Conor tolerated session well with no adverse reaction.  Pt reports significant pain relief following manual therapy.   Progressing periscapular and core exercises as expected.  Able to complete larger ROM during bridge today.  Correspond with reduced pain during ADLs at home.    OBJECTIVE IMPAIRMENTS: Abnormal gait, decreased activity tolerance, decreased endurance, decreased mobility, difficulty walking, decreased ROM, decreased strength, impaired flexibility, impaired UE functional use, improper body mechanics, postural dysfunction, and pain.    ACTIVITY LIMITATIONS: carrying, lifting, bending, sitting, standing, squatting, sleeping, and stairs   PERSONAL FACTORS: Age, Fitness, Past/current experiences, Time since onset of injury/illness/exacerbation, and B RC repairs  are also affecting patient's functional outcome.    REHAB POTENTIAL: Fair based on chronic pain and time since onset   CLINICAL DECISION MAKING: Evolving/moderate complexity   EVALUATION COMPLEXITY: Moderate     GOALS: Goals reviewed with patient? No   SHORT TERM GOALS: Target date: 07/27/2022   Patient to demonstrate independence in HEP  Baseline: 88WLXHDH Goal status: MET   2.  Assess trunk mobility Baseline: TBD 4/10: limited all planes Goal status: MET       LONG TERM GOALS: Target date: 08/10/2022   Decrease ODI score to 22/50 Baseline: 30/50 Goal status: INITIAL   2.  Increase BLE strength to 4+/5 Baseline:  MMT Right eval Left eval  Hip flexion 4 4  Hip extension      Hip abduction 4 4  Hip adduction      Hip internal rotation      Hip external rotation      Knee flexion 4 4  Knee extension 4 4  Ankle dorsiflexion      Ankle plantarflexion 4 4    Goal status: INITIAL   3.  Increase cervical ROM to 75% globally Baseline:  Active ROM A/PROM (deg) eval  Flexion 75%  Extension 75%  Right lateral flexion 50%  Left lateral flexion 25%  Right rotation 50%P!  Left rotation 90%    Goal status: INITIAL   4.  Increase B shoulder flexion and abduction to 160d Baseline:  Active ROM Right eval Left eval   Shoulder flexion 135d 90d  Shoulder extension WNL WNL  Shoulder abduction 150d 100d    Goal status: INITIAL       PLAN:   PT FREQUENCY: 2x/week   PT DURATION: 4 weeks   PLANNED INTERVENTIONS: Therapeutic exercises, Therapeutic activity, Neuromuscular re-education, Balance training, Gait training, Patient/Family education, Self Care, Joint mobilization, Dry Needling, Manual therapy, and Re-evaluation.   PLAN FOR NEXT SESSION: HEP review and update, ROM and flexibility tasks for all affected regions, posture exercises, core and LE strengthening   Kimberlee Nearing Christifer Chapdelaine PT 08/05/2022, 9:22 AM

## 2022-08-07 ENCOUNTER — Other Ambulatory Visit: Payer: Self-pay | Admitting: Family Medicine

## 2022-08-07 DIAGNOSIS — E1165 Type 2 diabetes mellitus with hyperglycemia: Secondary | ICD-10-CM

## 2022-08-09 ENCOUNTER — Other Ambulatory Visit: Payer: Self-pay | Admitting: Family Medicine

## 2022-08-09 ENCOUNTER — Ambulatory Visit: Payer: Medicaid Other

## 2022-08-09 DIAGNOSIS — E1165 Type 2 diabetes mellitus with hyperglycemia: Secondary | ICD-10-CM

## 2022-08-10 ENCOUNTER — Other Ambulatory Visit: Payer: Self-pay | Admitting: Family Medicine

## 2022-08-10 DIAGNOSIS — E1165 Type 2 diabetes mellitus with hyperglycemia: Secondary | ICD-10-CM

## 2022-08-10 NOTE — Therapy (Signed)
OUTPATIENT PHYSICAL THERAPY TREATMENT NOTE   Patient Name: Curtis Williamson MRN: [id number] DOB:07/31/1960, 62 y.o., male Today's Date: 08/11/2022  PCP: Georganna Skeans, MD    REFERRING PROVIDER: Jamey Reas, PA-C    PT End of Session - 08/11/22 0825     Visit Number 5    Number of Visits 8    Date for PT Re-Evaluation 09/07/22    Authorization Type [place] [facility]    PT Start Time 0825    PT Stop Time 0905    PT Time Calculation (min) 40 min    Activity Tolerance Patient tolerated treatment well    Behavior During Therapy [facility] for tasks assessed/performed               Past Medical History:  Diagnosis Date   Chronic kidney disease    only has one kidney   Diabetes mellitus without complication    Essential hypertension    Hyperlipidemia    Hypertension    Phreesia 01/13/2020   Past Surgical History:  Procedure Laterality Date   KIDNEY DONATION     MANDIBLE FRACTURE SURGERY     ROTATOR CUFF REPAIR Right    Patient Active Problem List   Diagnosis Date Noted   Low back pain 10/28/2020   Pain due to onychomycosis of toenails of both feet 10/09/2020   Hyperlipidemia 09/22/2020   Thickened nails 09/22/2020   Insomnia 09/22/2020   Acute pain of left shoulder 05/19/2020   Tobacco dependence 03/26/2019   Essential hypertension 03/26/2019   Type 2 diabetes mellitus without complication, without long-term current use of insulin 03/26/2019   Aneurysm, cerebral, nonruptured 03/26/2019    THERAPY DIAG:  Chronic bilateral low back pain without sciatica  Cervicalgia  Muscle weakness (generalized)  Stiffness of left shoulder, not elsewhere classified   Rationale for Evaluation and Treatment Rehabilitation  REFERRING DIAG: M54.2 (ICD-10-CM) - Cervicalgia   PERTINENT HISTORY:      PRECAUTIONS/RESTRICTIONS:   none  SUBJECTIVE:  Patient reports that the TPDN was very helpful last week, today he states most of his pain is in his neck, lower back, and  also has a headache that he believes it caused by the neck pain.   PAIN:  Are you having pain? Yes: NPRS scale: 4-5/10 Pain location: low back, neck, L shoulder Pain description: ache Aggravating factors: activity Relieving factors: pain meds , heat, massage  OBJECTIVE: (objective measures completed at initial evaluation unless otherwise dated)  DIAGNOSTIC FINDINGS:  None available   PATIENT SURVEYS:  Modified Oswestry 30/50(60% perceived disability)    SCREENING FOR RED FLAGS: negative     MUSCLE LENGTH: Hamstrings: Right 60 deg; Left 60 deg Thomas test: PKB restricted B   POSTURE: rounded shoulders and forward head   PALPATION: Deferred    LUMBAR ROM: Deferred due to time constraints   AROM eval  Flexion    Extension    Right lateral flexion    Left lateral flexion    Right rotation    Left rotation     (Blank rows = not tested)   LOWER EXTREMITY ROM:   WFL for gait and transfers   Active  Right eval Left eval  Hip flexion      Hip extension      Hip abduction      Hip adduction      Hip internal rotation      Hip external rotation      Knee flexion      Knee extension  Ankle dorsiflexion      Ankle plantarflexion      Ankle inversion      Ankle eversion       (Blank rows = not tested)   LOWER EXTREMITY MMT:     MMT Right eval Left eval  Hip flexion 4 4  Hip extension      Hip abduction 4 4  Hip adduction      Hip internal rotation      Hip external rotation      Knee flexion 4 4  Knee extension 4 4  Ankle dorsiflexion      Ankle plantarflexion 4 4  Ankle inversion      Ankle eversion       (Blank rows = not tested)   LUMBAR SPECIAL TESTS:  Straight leg raise test: Negative, Slump test: Positive, and FABER test: Negative positive R slump   FUNCTIONAL TESTS:  5 times sit to stand: 25s arms crossed   CERVICAL ROM:    Active ROM A/PROM (deg) eval  Flexion 75%  Extension 75%  Right lateral flexion 50%  Left lateral flexion  25%  Right rotation 50%P!  Left rotation 90%   (Blank rows = not tested)  UPPER EXTREMITY ROM:   Active ROM Right eval Left eval  Shoulder flexion 135d 90d  Shoulder extension WNL WNL  Shoulder abduction 150d 100d  Shoulder adduction      Shoulder extension      Shoulder internal rotation      Shoulder external rotation      Elbow flexion      Elbow extension      Wrist flexion      Wrist extension      Wrist ulnar deviation      Wrist radial deviation      Wrist pronation      Wrist supination       (Blank rows = not tested)    GAIT: Distance walked: 47ft x2 Assistive device utilized: None Level of assistance: Complete Independence Comments: slow cadence antalgic gait     PATIENT EDUCATION:  Education details: Discussed eval findings, rehab rationale and POC and patient is in agreement  Person educated: Patient Education method: Explanation Education comprehension: verbalized understanding and needs further education   HOME EXERCISE PROGRAM: Access Code: [facility] URL: [email or web address] Date: 07/13/2022 Prepared by: Gustavus Bryant   Exercises - Sit to Stand Without Arm Support  - 2 x daily - 5 x weekly - 2 sets - 5 reps   TREATMENT 4/25: Therapeutic Exercise: - nu-step L7 81m while taking subjective and planning session with patient - horizontal abd - Blue TB - 2x10 - palloff press 10# 2x10 BIL - rows 17# 2x10 - S/L clam - GTB - 2x10 - moderate arc bridge - 2x10 - small arc bridge with ball - x10 - supine hip adduction ball squeeze 5" hold 2x10 - LTR x10 BIL  Manual Therapy: - Sub occipital release - STM bil UT and sub occipitals, Rt lumbar paraspinals   TREATMENT 4/19:  Therapeutic Exercise: - nu-step L7 39m while taking subjective and planning session with patient - chin tuck in supine - 2x10 - L/R x10 ea - scap setting in supine - 2x10 - horizontal abd - Blue TB - 2x10 - chest press - 7# ea - 2x10 - S/L clam - GTB - 2x10 -  moderate arc bridge - 2x10 - plank from knees - 3x to fatigue  Manual therapy: Skilled palpation to identify trigger  points for TDN STM to all listed muscles following TDN  Trigger Point Dry-Needling  Treatment instructions: Expect mild to moderate muscle soreness. S/S of pneumothorax if dry needled over a lung field, and to seek immediate medical attention should they occur. Patient verbalized understanding of these instructions and education.  Patient Consent Given: Yes Education handout provided: No Muscles treated: sub occipitals bil, UT bil, ~T9 - > L2 paraspinals (estim) Electrical stimulation performed: Yes Parameters: 8 min low frequency - milli amps - low intensity; 0 min - micro amps - high frequency high intensity. Treatment response/outcome: pain reduction  TREATMENT 4/17: Therapeutic Exercise: - nu-step L6 81m while taking subjective and planning session with patient - horizontal abd - RTB - 3x10 - supine diagonals - RTB - 2x10 - PPT - 2x10 - 3'' hold - hip adduction squeeze - 5'' hold 2x10 - Alternating supine clam with abdominal contraction - RTB - 2x10 ea - small arc bridge 2x10 - figure 4 piriformis stretch 2x30" BIL - supine sciatic nerve glide x20 Rt - modified thomas stretch EOM x1' BIL - LTR x10 BIL  Consider more direct R/C strengthening  Manual Therapy: - Sub occipital release - STM bil UT and sub occipitals    ASSESSMENT:   CLINICAL IMPRESSION: Patient presents to PT reporting continued neck and lower back pain, but states that the TPDN last session was very helpful. Session today focused on periscapular, proximal hip, and core strengthening as well manual techniques to decrease pain and tension in upper traps and cervical paraspinals. Patient was able to tolerate all prescribed exercises with no adverse effects. Patient continues to benefit from skilled PT services and should be progressed as able to improve functional independence.    OBJECTIVE  IMPAIRMENTS: Abnormal gait, decreased activity tolerance, decreased endurance, decreased mobility, difficulty walking, decreased ROM, decreased strength, impaired flexibility, impaired UE functional use, improper body mechanics, postural dysfunction, and pain.    ACTIVITY LIMITATIONS: carrying, lifting, bending, sitting, standing, squatting, sleeping, and stairs   PERSONAL FACTORS: Age, Fitness, Past/current experiences, Time since onset of injury/illness/exacerbation, and B RC repairs  are also affecting patient's functional outcome.    REHAB POTENTIAL: Fair based on chronic pain and time since onset   CLINICAL DECISION MAKING: Evolving/moderate complexity   EVALUATION COMPLEXITY: Moderate     GOALS: Goals reviewed with patient? No   SHORT TERM GOALS: Target date: 07/27/2022   Patient to demonstrate independence in HEP  Baseline: [id number] Goal status: MET   2.  Assess trunk mobility Baseline: TBD 4/10: limited all planes Goal status: MET       LONG TERM GOALS: Target date: 08/10/2022   Decrease ODI score to 22/50 Baseline: 30/50 Goal status: INITIAL   2.  Increase BLE strength to 4+/5 Baseline:  MMT Right eval Left eval  Hip flexion 4 4  Hip extension      Hip abduction 4 4  Hip adduction      Hip internal rotation      Hip external rotation      Knee flexion 4 4  Knee extension 4 4  Ankle dorsiflexion      Ankle plantarflexion 4 4    Goal status: INITIAL   3.  Increase cervical ROM to 75% globally Baseline:  Active ROM A/PROM (deg) eval  Flexion 75%  Extension 75%  Right lateral flexion 50%  Left lateral flexion 25%  Right rotation 50%P!  Left rotation 90%    Goal status: INITIAL   4.  Increase B  shoulder flexion and abduction to 160d Baseline:  Active ROM Right eval Left eval  Shoulder flexion 135d 90d  Shoulder extension WNL WNL  Shoulder abduction 150d 100d    Goal status: INITIAL       PLAN:   PT FREQUENCY: 2x/week   PT DURATION:  4 weeks   PLANNED INTERVENTIONS: Therapeutic exercises, Therapeutic activity, Neuromuscular re-education, Balance training, Gait training, Patient/Family education, Self Care, Joint mobilization, Dry Needling, Manual therapy, and Re-evaluation.   PLAN FOR NEXT SESSION: HEP review and update, ROM and flexibility tasks for all affected regions, posture exercises, core and LE strengthening   Berta Minor PTA 08/11/2022, 9:06 AM

## 2022-08-11 ENCOUNTER — Ambulatory Visit: Payer: Medicaid Other

## 2022-08-11 ENCOUNTER — Other Ambulatory Visit: Payer: Self-pay | Admitting: Family Medicine

## 2022-08-11 DIAGNOSIS — M25612 Stiffness of left shoulder, not elsewhere classified: Secondary | ICD-10-CM

## 2022-08-11 DIAGNOSIS — E1165 Type 2 diabetes mellitus with hyperglycemia: Secondary | ICD-10-CM

## 2022-08-11 DIAGNOSIS — M6281 Muscle weakness (generalized): Secondary | ICD-10-CM

## 2022-08-11 DIAGNOSIS — M545 Low back pain, unspecified: Secondary | ICD-10-CM

## 2022-08-11 DIAGNOSIS — M542 Cervicalgia: Secondary | ICD-10-CM

## 2022-08-12 ENCOUNTER — Other Ambulatory Visit: Payer: Self-pay | Admitting: Family Medicine

## 2022-08-12 ENCOUNTER — Ambulatory Visit (INDEPENDENT_AMBULATORY_CARE_PROVIDER_SITE_OTHER): Payer: Medicaid Other | Admitting: Family Medicine

## 2022-08-12 ENCOUNTER — Encounter: Payer: Self-pay | Admitting: Family Medicine

## 2022-08-12 VITALS — BP 131/86 | HR 79 | Temp 97.6°F | Resp 16 | Wt 188.0 lb

## 2022-08-12 DIAGNOSIS — I10 Essential (primary) hypertension: Secondary | ICD-10-CM

## 2022-08-12 DIAGNOSIS — E785 Hyperlipidemia, unspecified: Secondary | ICD-10-CM | POA: Diagnosis not present

## 2022-08-12 DIAGNOSIS — Z7984 Long term (current) use of oral hypoglycemic drugs: Secondary | ICD-10-CM

## 2022-08-12 DIAGNOSIS — E1165 Type 2 diabetes mellitus with hyperglycemia: Secondary | ICD-10-CM | POA: Diagnosis not present

## 2022-08-12 DIAGNOSIS — F17210 Nicotine dependence, cigarettes, uncomplicated: Secondary | ICD-10-CM

## 2022-08-12 DIAGNOSIS — E1169 Type 2 diabetes mellitus with other specified complication: Secondary | ICD-10-CM

## 2022-08-12 LAB — POCT GLYCOSYLATED HEMOGLOBIN (HGB A1C): Hemoglobin A1C: 7.9 % — AB (ref 4.0–5.6)

## 2022-08-12 MED ORDER — EMPAGLIFLOZIN 25 MG PO TABS: ORAL_TABLET | ORAL | 1 refills | Status: AC

## 2022-08-12 NOTE — Telephone Encounter (Signed)
Requested Prescriptions  Refused Prescriptions Disp Refills   JARDIANCE 25 MG TABS tablet [Pharmacy Med Name: JARDIANCE 25 MG TABLET] 90 tablet 1    Sig: TAKE 1 TABLET BY MOUTH EVERY DAY BEFORE BREAKFAST     Endocrinology:  Diabetes - SGLT2 Inhibitors Failed - 08/12/2022 10:23 AM      Failed - Valid encounter within last 6 months    Recent Outpatient Visits           Today Type 2 diabetes mellitus with hyperglycemia, without long-term current use of insulin (HCC)   [place] [facility], Lauris Poag, MD   7 months ago Type 2 diabetes mellitus with hyperglycemia, without long-term current use of insulin (HCC)   [place] [facility] at [facility], MD   10 months ago Type 2 diabetes mellitus with hyperglycemia, without long-term current use of insulin (HCC)   [place] [facility] at [facility], MD   11 months ago Type 2 diabetes mellitus with hyperglycemia, without long-term current use of insulin (HCC)   [place] [facility] [place], [place]   1 year ago Hypoxia   [place] [facility] at [facility], MD       Future Appointments             In 3 months Georganna Skeans, MD [facility] at [facility] - Cr in normal range and within 360 days    Creatinine, Ser  Date Value Ref Range Status  06/02/2022 1.16 0.61 - 1.24 mg/dL Final         Passed - HBA1C is between 0 and 7.9 and within 180 days    Hemoglobin A1C  Date Value Ref Range Status  08/12/2022 7.9 (A) 4.0 - 5.6 % Final   HbA1c, POC (controlled diabetic range)  Date Value Ref Range Status  07/13/2020 7.2 (A) 0.0 - 7.0 % Final         Passed - eGFR in normal range and within 360 days    GFR calc Af Amer  Date Value Ref Range Status  01/23/2020 75 >59 mL/min/1.73 Final    Comment:    **[facility] currently reports eGFR in compliance with the current**   recommendations  of the [employer]. [facility] will   update reporting as new guidelines are published from the NKF-ASN   Task force.    GFR, Estimated  Date Value Ref Range Status  06/02/2022 >60 >60 mL/min Final    Comment:    (NOTE) Calculated using the CKD-EPI Creatinine Equation (2021)    eGFR  Date Value Ref Range Status  09/02/2021 67 >59 mL/min/1.73 Final

## 2022-08-12 NOTE — Telephone Encounter (Signed)
Requested medications are due for refill today.  No see note  Requested medications are on the active medications list.  yes  Last refill. 08/12/2022 #90 1 rf  - not filled  Future visit scheduled.   yes  Notes to clinic.  Pharmacy comment: Alternative Requested:DO PA.    Requested Prescriptions  Pending Prescriptions Disp Refills   JARDIANCE 25 MG TABS tablet [Pharmacy Med Name: JARDIANCE 25 MG TABLET] 90 tablet 1    Sig: TAKE 1 TABLET BY MOUTH EVERY DAY BEFORE BREAKFAST     Endocrinology:  Diabetes - SGLT2 Inhibitors Failed - 08/12/2022  2:44 PM      Failed - Valid encounter within last 6 months    Recent Outpatient Visits           Today Type 2 diabetes mellitus with hyperglycemia, without long-term current use of insulin (HCC)   [place] [facility] at [facility], MD   7 months ago Type 2 diabetes mellitus with hyperglycemia, without long-term current use of insulin (HCC)   [place] [facility] at [facility], MD   10 months ago Type 2 diabetes mellitus with hyperglycemia, without long-term current use of insulin (HCC)   [place] [facility] at [facility], MD   11 months ago Type 2 diabetes mellitus with hyperglycemia, without long-term current use of insulin (HCC)   [place] [facility] at [facility], [place], PA-C   1 year ago Hypoxia   [place] [facility] at [facility], MD       Future Appointments             In 3 months Georganna Skeans, MD [facility] at [facility] - Cr in normal range and within 360 days    Creatinine, Ser  Date Value Ref Range Status  06/02/2022 1.16 0.61 - 1.24 mg/dL Final         Passed - HBA1C is between 0 and 7.9 and within 180 days    Hemoglobin A1C  Date Value Ref Range Status  08/12/2022 7.9 (A) 4.0 - 5.6 % Final   HbA1c, POC (controlled diabetic range)  Date Value Ref Range Status   07/13/2020 7.2 (A) 0.0 - 7.0 % Final         Passed - eGFR in normal range and within 360 days    GFR calc Af Amer  Date Value Ref Range Status  01/23/2020 75 >59 mL/min/1.73 Final    Comment:    **[facility] currently reports eGFR in compliance with the current**   recommendations of the [employer]. [facility] will   update reporting as new guidelines are published from the NKF-ASN   Task force.    GFR, Estimated  Date Value Ref Range Status  06/02/2022 >60 >60 mL/min Final    Comment:    (NOTE) Calculated using the CKD-EPI Creatinine Equation (2021)    eGFR  Date Value Ref Range Status  09/02/2021 67 >59 mL/min/1.73 Final

## 2022-08-15 ENCOUNTER — Encounter: Payer: Self-pay | Admitting: Family Medicine

## 2022-08-15 NOTE — Progress Notes (Signed)
Established Patient Office Visit  Subjective    Patient ID: Curtis Williamson, male    DOB: 1960-06-16  Age: 62 y.o. MRN: [id number]  CC:  Chief Complaint  Patient presents with   Follow-up   Diabetes    HPI Curtis Williamson presents for routine follow up of chronic med issues.    Outpatient Encounter Medications as of 08/12/2022  Medication Sig   Accu-Chek Softclix Lancets lancets Use as instructed   atorvastatin (LIPITOR) 10 MG tablet TAKE 1 TABLET (10 MG TOTAL) BY MOUTH DAILY.   Blood Glucose Monitoring Suppl (ACCU-CHEK GUIDE ME) w/Device KIT 1 each by Does not apply route 2 (two) times daily. Use to check FSBS twice a day. Dx: E11.9   gabapentin (NEURONTIN) 300 MG capsule Take 1 capsule (300 mg total) by mouth 3 (three) times daily.   glucose blood (ACCU-CHEK GUIDE) test strip Use as instructed   hydrOXYzine (ATARAX/VISTARIL) 25 MG tablet Take 1 tablet (25 mg total) by mouth every 6 (six) hours.   latanoprost (XALATAN) 0.005 % ophthalmic solution SMARTSIG:1 Drop(s) In Eye(s) Every Evening   lisinopril (ZESTRIL) 10 MG tablet TAKE 1 TABLET BY MOUTH EVERY DAY   metFORMIN (GLUCOPHAGE) 500 MG tablet TAKE 1 TABLET BY MOUTH TWICE A DAY WITH FOOD   oxyCODONE-acetaminophen (PERCOCET) 10-325 MG tablet    traZODone (DESYREL) 50 MG tablet TAKE 0.5-1 TABLETS BY MOUTH AT BEDTIME AS NEEDED FOR SLEEP.   triamcinolone cream (KENALOG) 0.1 % Apply 1 application topically 2 (two) times daily.   [DISCONTINUED] empagliflozin (JARDIANCE) 25 MG TABS tablet TAKE 1 TABLET BY MOUTH EVERY DAY BEFORE BREAKFAST   empagliflozin (JARDIANCE) 25 MG TABS tablet TAKE 1 TABLET BY MOUTH EVERY DAY BEFORE BREAKFAST   No facility-administered encounter medications on file as of 08/12/2022.    Past Medical History:  Diagnosis Date   Chronic kidney disease    only has one kidney   Diabetes mellitus without complication (HCC)    Essential hypertension    Hyperlipidemia    Hypertension    Phreesia  01/13/2020    Past Surgical History:  Procedure Laterality Date   KIDNEY DONATION     MANDIBLE FRACTURE SURGERY     ROTATOR CUFF REPAIR Right     Family History  Problem Relation Age of Onset   Hypertension Mother    Kidney failure Mother    Kidney disease Mother    Heart disease Mother    ADD / ADHD Daughter    Anxiety disorder Daughter    Alcohol abuse Father    Colon cancer Neg Hx    Stomach cancer Neg Hx    Esophageal cancer Neg Hx    Colon polyps Neg Hx     Social History   Socioeconomic History   Marital status: Single    Spouse name: Not on file   Number of children: 1   Years of education: Not on file   Highest education level: Some college, no degree  Occupational History   Occupation: Naval architect  Tobacco Use   Smoking status: Every Day    Packs/day: 0.50    Years: 30.00    Additional pack years: 0.00    Total pack years: 15.00    Types: Cigarettes   Smokeless tobacco: Never   Tobacco comments:    off and on, has tried to quit  Vaping Use   Vaping Use: Never used  Substance and Sexual Activity   Alcohol use: Yes    Comment: rarely   Drug  use: Never   Sexual activity: Not Currently  Other Topics Concern   Not on file  Social History Narrative   Right handed   One story home   No caffeine    Social Determinants of Health   Financial Resource Strain: Not on file  Food Insecurity: Not on file  Transportation Needs: Not on file  Physical Activity: Not on file  Stress: Not on file  Social Connections: Not on file  Intimate Partner Violence: Not on file    Review of Systems  All other systems reviewed and are negative.       Objective    BP 131/86   Pulse 79   Temp 97.6 F (36.4 C) (Oral)   Resp 16   Wt 188 lb (85.3 kg)   SpO2 98%   BMI 26.22 kg/m   Physical Exam Vitals and nursing note reviewed.  Constitutional:      General: He is not in acute distress. Cardiovascular:     Rate and Rhythm: Normal rate and regular  rhythm.  Pulmonary:     Effort: Pulmonary effort is normal.     Breath sounds: Normal breath sounds.  Abdominal:     Palpations: Abdomen is soft.     Tenderness: There is no abdominal tenderness.  Musculoskeletal:     Right lower leg: No edema.     Left lower leg: No edema.  Neurological:     General: No focal deficit present.     Mental Status: He is alert and oriented to person, place, and time.         Assessment & Plan:   1. Type 2 diabetes mellitus with hyperglycemia, without long-term current use of insulin (HCC) Much improved A1c but still above goal. Continue.  - POCT glycosylated hemoglobin (Hb A1C) - HM DIABETES FOOT EXAM - empagliflozin (JARDIANCE) 25 MG TABS tablet; TAKE 1 TABLET BY MOUTH EVERY DAY BEFORE BREAKFAST  Dispense: 90 tablet; Refill: 1  2. Essential hypertension Appears stable. Continue   3. Hyperlipidemia associated with type 2 diabetes mellitus (HCC) Continue     Return in about 3 months (around 11/11/2022).   Tommie Raymond, MD

## 2022-08-15 NOTE — Therapy (Signed)
OUTPATIENT PHYSICAL THERAPY TREATMENT NOTE   Patient Name: Curtis Williamson MRN: [id number] DOB:02/09/1961, 62 y.o., male Today's Date: 08/16/2022   PCP: Georganna Skeans, MD  REFERRING PROVIDER: Jamey Reas, PA-C      PT End of Session - 08/16/22 [phone]     Visit Number 6    Number of Visits 8    Date for PT Re-Evaluation 09/07/22    Authorization Type [place] [facility]    Authorization - Number of Visits 27    PT Start Time 0845    PT Stop Time 0930    PT Time Calculation (min) 45 min    Activity Tolerance Patient tolerated treatment well    Behavior During Therapy [facility] for tasks assessed/performed                Past Medical History:  Diagnosis Date   Chronic kidney disease    only has one kidney   Diabetes mellitus without complication (HCC)    Essential hypertension    Hyperlipidemia    Hypertension    Phreesia 01/13/2020   Past Surgical History:  Procedure Laterality Date   KIDNEY DONATION     MANDIBLE FRACTURE SURGERY     ROTATOR CUFF REPAIR Right    Patient Active Problem List   Diagnosis Date Noted   Lumbosacral spondylosis without myelopathy 12/13/2021   Low back pain 10/28/2020   Pain due to onychomycosis of toenails of both feet 10/09/2020   Hyperlipidemia 09/22/2020   Thickened nails 09/22/2020   Insomnia 09/22/2020   Acute pain of left shoulder 05/19/2020   Tobacco dependence 03/26/2019   Essential hypertension 03/26/2019   Type 2 diabetes mellitus without complication, without long-term current use of insulin (HCC) 03/26/2019   Aneurysm, cerebral, nonruptured 03/26/2019    THERAPY DIAG:  Chronic bilateral low back pain without sciatica  Cervicalgia  Muscle weakness (generalized)  Stiffness of left shoulder, not elsewhere classified   Rationale for Evaluation and Treatment Rehabilitation  REFERRING DIAG: M54.2 (ICD-10-CM) - Cervicalgia   PERTINENT HISTORY:      PRECAUTIONS/RESTRICTIONS:   none  SUBJECTIVE:  Patient  reports continued pain, not as bad as it has been but still has it. Pain is primarily in the neck and upper back  PAIN:  Are you having pain? Yes:  NPRS scale: 3/10 Pain location: low back, neck, L shoulder Pain description: ache Aggravating factors: activity Relieving factors: pain meds , heat, massage  OBJECTIVE: (objective measures completed at initial evaluation unless otherwise dated)  DIAGNOSTIC FINDINGS:  None available   PATIENT SURVEYS:  Modified Oswestry 30/50 (60% perceived disability)    SCREENING FOR RED FLAGS: negative     MUSCLE LENGTH: Hamstrings: Right 60 deg; Left 60 deg Thomas test: PKB restricted B   POSTURE: rounded shoulders and forward head   PALPATION: Deferred    LUMBAR ROM: Deferred due to time constraints   AROM eval  Flexion    Extension    Right lateral flexion    Left lateral flexion    Right rotation    Left rotation     (Blank rows = not tested)   LOWER EXTREMITY ROM:   WFL for gait and transfers   Active  Right eval Left eval  Hip flexion      Hip extension      Hip abduction      Hip adduction      Hip internal rotation      Hip external rotation      Knee flexion  Knee extension      Ankle dorsiflexion      Ankle plantarflexion      Ankle inversion      Ankle eversion       (Blank rows = not tested)   LOWER EXTREMITY MMT:     MMT Right eval Left eval  Hip flexion 4 4  Hip extension      Hip abduction 4 4  Hip adduction      Hip internal rotation      Hip external rotation      Knee flexion 4 4  Knee extension 4 4  Ankle dorsiflexion      Ankle plantarflexion 4 4  Ankle inversion      Ankle eversion       (Blank rows = not tested)   LUMBAR SPECIAL TESTS:  Straight leg raise test: Negative, Slump test: Positive, and FABER test: Negative positive R slump   FUNCTIONAL TESTS:  5 times sit to stand: 25s arms crossed   CERVICAL ROM:    Active ROM A/PROM (deg) eval  Flexion 75%  Extension 75%   Right lateral flexion 50%  Left lateral flexion 25%  Right rotation 50%P!  Left rotation 90%   (Blank rows = not tested)   UPPER EXTREMITY ROM:   Active ROM Right eval Left eval  Shoulder flexion 135d 90d  Shoulder extension WNL WNL  Shoulder abduction 150d 100d  Shoulder adduction      Shoulder extension      Shoulder internal rotation      Shoulder external rotation      Elbow flexion      Elbow extension      Wrist flexion      Wrist extension      Wrist ulnar deviation      Wrist radial deviation      Wrist pronation      Wrist supination       (Blank rows = not tested)    GAIT: Distance walked: 4ft x2 Assistive device utilized: None Level of assistance: Complete Independence Comments: slow cadence antalgic gait     PATIENT EDUCATION:  Education details: TPDN, HEP Person educated: Patient Education method: Explanation Education comprehension: verbalized understanding and needs further education   HOME EXERCISE PROGRAM: Access Code: [email or web address] URL: [email or web address] Date: 07/13/2022 Prepared by: Gustavus Bryant   Exercises - Sit to Stand Without Arm Support  - 2 x daily - 5 x weekly - 2 sets - 5 reps    REATMENT 4/30: Therapeutic Exercise: - nu-step L7 73m while taking subjective and planning session with patient - seated upper trap stretch 3 x 15 sec on left - sidelying thoracic rotation x 10 each - LTR x 10 - Supine thoracic extension of FR at various levels - doorway pec stretch at 60 deg 3 x 20 sec - row with blue x 15 - extension with red x 15 - double ER and scap retraction with blue x 15 Manual: - skilled palpation and monitoring of muscle tension while performing TPDN - STM left upper trap region Trigger Point Dry Needling Treatment: Pre-treatment instruction: Patient instructed on dry needling rationale, procedures, and possible side effects including pain during treatment (achy,cramping feeling), bruising, drop of blood,  lightheadedness, nausea, sweating. Patient Consent Given: Yes Education handout provided: Previously provided Muscles treated: Left upper trap, right thoracic multifidi  Needle size and number: .30x21mm x 2 and .30x40mm x 3 Electrical stimulation performed: No Parameters: N/A Treatment response/outcome: Twitch response elicited  and Palpable decrease in muscle tension Post-treatment instructions: Patient instructed to expect possible mild to moderate muscle soreness later today and/or tomorrow. Patient instructed in methods to reduce muscle soreness and to continue prescribed HEP. If patient was dry needled over the lung field, patient was instructed on signs and symptoms of pneumothorax and, however unlikely, to see immediate medical attention should they occur. Patient was also educated on signs and symptoms of infection and to seek medical attention should they occur. Patient verbalized understanding of these instructions and education.    TREATMENT 4/25: Therapeutic Exercise: - nu-step L7 11m while taking subjective and planning session with patient - horizontal abd - Blue TB - 2x10 - palloff press 10# 2x10 BIL - rows 17# 2x10 - S/L clam - GTB - 2x10 - moderate arc bridge - 2x10 - small arc bridge with ball - x10 - supine hip adduction ball squeeze 5" hold 2x10 - LTR x10 BIL  Manual Therapy: - Sub occipital release - STM bil UT and sub occipitals, Rt lumbar paraspinals   TREATMENT 4/19:  Therapeutic Exercise: - nu-step L7 63m while taking subjective and planning session with patient - chin tuck in supine - 2x10 - L/R x10 ea - scap setting in supine - 2x10 - horizontal abd - Blue TB - 2x10 - chest press - 7# ea - 2x10 - S/L clam - GTB - 2x10 - moderate arc bridge - 2x10 - plank from knees - 3x to fatigue  Manual therapy: Skilled palpation to identify trigger points for TDN STM to all listed muscles following TDN  Trigger Point Dry-Needling  Treatment instructions: Expect  mild to moderate muscle soreness. S/S of pneumothorax if dry needled over a lung field, and to seek immediate medical attention should they occur. Patient verbalized understanding of these instructions and education.  Patient Consent Given: Yes Education handout provided: No Muscles treated: sub occipitals bil, UT bil, ~T9 - > L2 paraspinals (estim) Electrical stimulation performed: Yes Parameters: 8 min low frequency - milli amps - low intensity; 0 min - micro amps - high frequency high intensity. Treatment response/outcome: pain reduction  TREATMENT 4/17: Therapeutic Exercise: - nu-step L6 56m while taking subjective and planning session with patient - horizontal abd - RTB - 3x10 - supine diagonals - RTB - 2x10 - PPT - 2x10 - 3'' hold - hip adduction squeeze - 5'' hold 2x10 - Alternating supine clam with abdominal contraction - RTB - 2x10 ea - small arc bridge 2x10 - figure 4 piriformis stretch 2x30" BIL - supine sciatic nerve glide x20 Rt - modified thomas stretch EOM x1' BIL - LTR x10 BIL  Consider more direct R/C strengthening  Manual Therapy: - Sub occipital release - STM bil UT and sub occipitals    ASSESSMENT:   CLINICAL IMPRESSION: Patient tolerated therapy well with no adverse effects. Performed TPDN for left neck/shoulder and right thoracic region with good tolerance. Therapy focused primarily on neck and thoracic mobility and postural strengthening. No changes made to HEP this visit. Patient continues to benefit from skilled PT services and should be progressed as able to improve functional independence.    OBJECTIVE IMPAIRMENTS: Abnormal gait, decreased activity tolerance, decreased endurance, decreased mobility, difficulty walking, decreased ROM, decreased strength, impaired flexibility, impaired UE functional use, improper body mechanics, postural dysfunction, and pain.    ACTIVITY LIMITATIONS: carrying, lifting, bending, sitting, standing, squatting, sleeping, and  stairs   PERSONAL FACTORS: Age, Fitness, Past/current experiences, Time since onset of injury/illness/exacerbation, and B RC repairs  are also affecting patient's functional outcome.    REHAB POTENTIAL: Fair based on chronic pain and time since onset   CLINICAL DECISION MAKING: Evolving/moderate complexity   EVALUATION COMPLEXITY: Moderate     GOALS: Goals reviewed with patient? No   SHORT TERM GOALS: Target date: 07/27/2022   Patient to demonstrate independence in HEP  Baseline: [email or web address] Goal status: MET   2.  Assess trunk mobility Baseline: TBD 4/10: limited all planes Goal status: MET       LONG TERM GOALS: Target date: 08/10/2022   Decrease ODI score to 22/50 Baseline: 30/50 Goal status: INITIAL   2.  Increase BLE strength to 4+/5 Baseline:  MMT Right eval Left eval  Hip flexion 4 4  Hip extension      Hip abduction 4 4  Hip adduction      Hip internal rotation      Hip external rotation      Knee flexion 4 4  Knee extension 4 4  Ankle dorsiflexion      Ankle plantarflexion 4 4    Goal status: INITIAL   3.  Increase cervical ROM to 75% globally Baseline:  Active ROM A/PROM (deg) eval  Flexion 75%  Extension 75%  Right lateral flexion 50%  Left lateral flexion 25%  Right rotation 50%P!  Left rotation 90%    Goal status: INITIAL   4.  Increase B shoulder flexion and abduction to 160d Baseline:  Active ROM Right eval Left eval  Shoulder flexion 135d 90d  Shoulder extension WNL WNL  Shoulder abduction 150d 100d    Goal status: INITIAL       PLAN:   PT FREQUENCY: 2x/week   PT DURATION: 4 weeks   PLANNED INTERVENTIONS: Therapeutic exercises, Therapeutic activity, Neuromuscular re-education, Balance training, Gait training, Patient/Family education, Self Care, Joint mobilization, Dry Needling, Manual therapy, and Re-evaluation.   PLAN FOR NEXT SESSION: HEP review and update, ROM and flexibility tasks for all affected regions, posture  exercises, core and LE strengthening   Rosana Hoes, PT, DPT, LAT, ATC 08/16/22  9:43 AM Phone: [phone] Fax: [phone]

## 2022-08-16 ENCOUNTER — Encounter: Payer: Self-pay | Admitting: Physical Therapy

## 2022-08-16 ENCOUNTER — Telehealth: Payer: Self-pay | Admitting: Family Medicine

## 2022-08-16 ENCOUNTER — Ambulatory Visit: Payer: Medicaid Other | Admitting: Physical Therapy

## 2022-08-16 ENCOUNTER — Other Ambulatory Visit: Payer: Self-pay

## 2022-08-16 DIAGNOSIS — M545 Low back pain, unspecified: Secondary | ICD-10-CM | POA: Diagnosis not present

## 2022-08-16 DIAGNOSIS — M6281 Muscle weakness (generalized): Secondary | ICD-10-CM

## 2022-08-16 DIAGNOSIS — M542 Cervicalgia: Secondary | ICD-10-CM

## 2022-08-16 DIAGNOSIS — M25612 Stiffness of left shoulder, not elsewhere classified: Secondary | ICD-10-CM

## 2022-08-16 NOTE — Telephone Encounter (Signed)
PA has been sent to Pharmacy tech to help with pre-auth

## 2022-08-16 NOTE — Telephone Encounter (Signed)
[facility] called and spoke to [place], Pensions consultant about the refill(s) Jardiance requested. Advised it was sent on 08/12/22 #90/1 refill(s). She says a PA is needed and they have been waiting on the ok from the provider. Advised I will send this to the provider.

## 2022-08-16 NOTE — Telephone Encounter (Signed)
Nia from [facility] called requesting for someone to contact the patient once this PA is completed that way he knows when to contact his pharmacy   Best contact: [phone]

## 2022-08-16 NOTE — Telephone Encounter (Signed)
Medication Refill - Medication: empagliflozin (JARDIANCE) 25 MG TABS tablet .Marland Kitchen Patient states he's has  Been out for at least two weeks, please advise on the status of medication refill    Has the patient contacted their pharmacy? Yes.   (Agent: If no, request that the patient contact the pharmacy for the refill. If patient does not wish to contact the pharmacy document the reason why and proceed with request.) (Agent: If yes, when and what did the pharmacy advise?)  Preferred Pharmacy (with phone number or street name): [id number] [place], [place] - [address] [place] [address] Phone: [phone]  Fax: [phone] DEA #: [id number] DAW Reason: --      Has the patient been seen for an appointment in the last year OR does the patient have an upcoming appointment? Yes.    Agent: Please be advised that RX refills may take up to 3 business days. We ask that you follow-up with your pharmacy.

## 2022-08-17 ENCOUNTER — Other Ambulatory Visit: Payer: Self-pay

## 2022-08-17 NOTE — Therapy (Signed)
OUTPATIENT PHYSICAL THERAPY TREATMENT NOTE   Patient Name: Curtis Williamson MRN: [id number] DOB:1960/10/01, 62 y.o., male Today's Date: 08/18/2022   PCP: Georganna Skeans, MD  REFERRING PROVIDER: Jamey Reas, PA-C      PT End of Session - 08/18/22 [phone]     Visit Number 7    Number of Visits 8    Date for PT Re-Evaluation 09/07/22    Authorization Type [place] MCD    Authorization - Number of Visits 27    PT Start Time 0830    PT Stop Time 0910    PT Time Calculation (min) 40 min    Activity Tolerance Patient tolerated treatment well    Behavior During Therapy [facility] for tasks assessed/performed              Past Medical History:  Diagnosis Date   Chronic kidney disease    only has one kidney   Diabetes mellitus without complication (HCC)    Essential hypertension    Hyperlipidemia    Hypertension    Phreesia 01/13/2020   Past Surgical History:  Procedure Laterality Date   KIDNEY DONATION     MANDIBLE FRACTURE SURGERY     ROTATOR CUFF REPAIR Right    Patient Active Problem List   Diagnosis Date Noted   Lumbosacral spondylosis without myelopathy 12/13/2021   Low back pain 10/28/2020   Pain due to onychomycosis of toenails of both feet 10/09/2020   Hyperlipidemia 09/22/2020   Thickened nails 09/22/2020   Insomnia 09/22/2020   Acute pain of left shoulder 05/19/2020   Tobacco dependence 03/26/2019   Essential hypertension 03/26/2019   Type 2 diabetes mellitus without complication, without long-term current use of insulin (HCC) 03/26/2019   Aneurysm, cerebral, nonruptured 03/26/2019    THERAPY DIAG:  Chronic bilateral low back pain without sciatica  Cervicalgia  Muscle weakness (generalized)  Stiffness of left shoulder, not elsewhere classified   Rationale for Evaluation and Treatment Rehabilitation  REFERRING DIAG: M54.2 (ICD-10-CM) - Cervicalgia   PERTINENT HISTORY:      PRECAUTIONS/RESTRICTIONS:   none  SUBJECTIVE:  Patient reports  continued pain especially in mid/upper back, but decreased neck/shoulder pain.  PAIN:  Are you having pain? Yes:  NPRS scale: 3/10 Pain location: low back, neck, L shoulder Pain description: ache Aggravating factors: activity Relieving factors: pain meds , heat, massage  OBJECTIVE: (objective measures completed at initial evaluation unless otherwise dated)  DIAGNOSTIC FINDINGS:  None available   PATIENT SURVEYS:  Modified Oswestry 30/50 (60% perceived disability)    SCREENING FOR RED FLAGS: negative     MUSCLE LENGTH: Hamstrings: Right 60 deg; Left 60 deg Thomas test: PKB restricted B   POSTURE: rounded shoulders and forward head   PALPATION: Deferred    LUMBAR ROM: Deferred due to time constraints   AROM eval  Flexion    Extension    Right lateral flexion    Left lateral flexion    Right rotation    Left rotation     (Blank rows = not tested)   LOWER EXTREMITY ROM:   WFL for gait and transfers   Active  Right eval Left eval  Hip flexion      Hip extension      Hip abduction      Hip adduction      Hip internal rotation      Hip external rotation      Knee flexion      Knee extension      Ankle dorsiflexion  Ankle plantarflexion      Ankle inversion      Ankle eversion       (Blank rows = not tested)   LOWER EXTREMITY MMT:     MMT Right eval Left eval Right 08/18/22 Left 08/18/22  Hip flexion 4 4 4+ p! 4+  Hip extension        Hip abduction 4 4 4+ 4+  Hip adduction        Hip internal rotation        Hip external rotation        Knee flexion 4 4 4 4   Knee extension 4 4 4+ 4+  Ankle dorsiflexion        Ankle plantarflexion 4 4    Ankle inversion        Ankle eversion         (Blank rows = not tested)   LUMBAR SPECIAL TESTS:  Straight leg raise test: Negative, Slump test: Positive, and FABER test: Negative positive R slump   FUNCTIONAL TESTS:  5 times sit to stand: 25s arms crossed   CERVICAL ROM:    Active ROM A/PROM (deg) eval   Flexion 75%  Extension 75%  Right lateral flexion 50%  Left lateral flexion 25%  Right rotation 50%P!  Left rotation 90%   (Blank rows = not tested)   UPPER EXTREMITY ROM:   Active ROM Right eval Left eval  Shoulder flexion 135d 90d  Shoulder extension WNL WNL  Shoulder abduction 150d 100d  Shoulder adduction      Shoulder extension      Shoulder internal rotation      Shoulder external rotation      Elbow flexion      Elbow extension      Wrist flexion      Wrist extension      Wrist ulnar deviation      Wrist radial deviation      Wrist pronation      Wrist supination       (Blank rows = not tested)    GAIT: Distance walked: 72ft x2 Assistive device utilized: None Level of assistance: Complete Independence Comments: slow cadence antalgic gait     PATIENT EDUCATION:  Education details: TPDN, HEP Person educated: Patient Education method: Explanation Education comprehension: verbalized understanding and needs further education   HOME EXERCISE PROGRAM: Access Code: [email or web address] URL: [email or web address] Date: 07/13/2022 Prepared by: Gustavus Bryant   Exercises - Sit to Stand Without Arm Support  - 2 x daily - 5 x weekly - 2 sets - 5 reps   TREATMENT 5/2: Therapeutic Exercise: - nu-step L7 31m while taking subjective and planning session with patient - palloff press 7# x10, x5 BIL - rows 17# 2x10 - doorway pec stretch mid/low 2x30" each - seated upper trap stretch 2 x 20 sec BIL - LTR x 10 - SLR x10 BIL - PPT 5" hold x10 - moderate arc bridge - 2x10 - small arc bridge with ball - x10 Manual Therapy: - STM/MTP Rt thoracic/lumbar paraspinals  TREATMENT 4/30: Therapeutic Exercise: - nu-step L7 46m while taking subjective and planning session with patient - seated upper trap stretch 3 x 15 sec on left - sidelying thoracic rotation x 10 each - LTR x 10 - Supine thoracic extension of FR at various levels - doorway pec stretch at 60 deg 3 x  20 sec - row with blue x 15 - extension with red x 15 - double ER and scap retraction  with blue x 15 Manual: - skilled palpation and monitoring of muscle tension while performing TPDN - STM left upper trap region Trigger Point Dry Needling Treatment: Pre-treatment instruction: Patient instructed on dry needling rationale, procedures, and possible side effects including pain during treatment (achy,cramping feeling), bruising, drop of blood, lightheadedness, nausea, sweating. Patient Consent Given: Yes Education handout provided: Previously provided Muscles treated: Left upper trap, right thoracic multifidi  Needle size and number: .30x26mm x 2 and .30x45mm x 3 Electrical stimulation performed: No Parameters: N/A Treatment response/outcome: Twitch response elicited and Palpable decrease in muscle tension Post-treatment instructions: Patient instructed to expect possible mild to moderate muscle soreness later today and/or tomorrow. Patient instructed in methods to reduce muscle soreness and to continue prescribed HEP. If patient was dry needled over the lung field, patient was instructed on signs and symptoms of pneumothorax and, however unlikely, to see immediate medical attention should they occur. Patient was also educated on signs and symptoms of infection and to seek medical attention should they occur. Patient verbalized understanding of these instructions and education.    TREATMENT 4/25: Therapeutic Exercise: - nu-step L7 64m while taking subjective and planning session with patient - horizontal abd - Blue TB - 2x10 - palloff press 10# 2x10 BIL - rows 17# 2x10 - S/L clam - GTB - 2x10 - moderate arc bridge - 2x10 - small arc bridge with ball - x10 - supine hip adduction ball squeeze 5" hold 2x10 - LTR x10 BIL  Manual Therapy: - Sub occipital release - STM bil UT and sub occipitals, Rt lumbar paraspinals    ASSESSMENT:   CLINICAL IMPRESSION: Patient presents to PT reporting  continued pain in his mid/upper back and decreased pain in his neck and shoulder today. He states TPDN went well last session, decreasing his pain. Session today continued to focus on periscapular, proximal hip, and core strengthening. Manual techniques utilized to decrease pain and tension in thoracic and lumbar paraspinals. Patient continues to benefit from skilled PT services and should be progressed as able to improve functional independence.    OBJECTIVE IMPAIRMENTS: Abnormal gait, decreased activity tolerance, decreased endurance, decreased mobility, difficulty walking, decreased ROM, decreased strength, impaired flexibility, impaired UE functional use, improper body mechanics, postural dysfunction, and pain.    ACTIVITY LIMITATIONS: carrying, lifting, bending, sitting, standing, squatting, sleeping, and stairs   PERSONAL FACTORS: Age, Fitness, Past/current experiences, Time since onset of injury/illness/exacerbation, and B RC repairs  are also affecting patient's functional outcome.    REHAB POTENTIAL: Fair based on chronic pain and time since onset   CLINICAL DECISION MAKING: Evolving/moderate complexity   EVALUATION COMPLEXITY: Moderate     GOALS: Goals reviewed with patient? No   SHORT TERM GOALS: Target date: 07/27/2022   Patient to demonstrate independence in HEP  Baseline: [email or web address] Goal status: MET   2.  Assess trunk mobility Baseline: TBD 4/10: limited all planes Goal status: MET       LONG TERM GOALS: Target date: 08/10/2022   Decrease ODI score to 22/50 Baseline: 30/50 Goal status: INITIAL   2.  Increase BLE strength to 4+/5 Baseline:  MMT Right eval Left eval  Hip flexion 4 4  Hip extension      Hip abduction 4 4  Hip adduction      Hip internal rotation      Hip external rotation      Knee flexion 4 4  Knee extension 4 4  Ankle dorsiflexion      Ankle plantarflexion 4 4  Goal status: INITIAL   3.  Increase cervical ROM to 75%  globally Baseline:  Active ROM A/PROM (deg) eval  Flexion 75%  Extension 75%  Right lateral flexion 50%  Left lateral flexion 25%  Right rotation 50%P!  Left rotation 90%    Goal status: INITIAL   4.  Increase B shoulder flexion and abduction to 160d Baseline:  Active ROM Right eval Left eval  Shoulder flexion 135d 90d  Shoulder extension WNL WNL  Shoulder abduction 150d 100d    Goal status: INITIAL       PLAN:   PT FREQUENCY: 2x/week   PT DURATION: 4 weeks   PLANNED INTERVENTIONS: Therapeutic exercises, Therapeutic activity, Neuromuscular re-education, Balance training, Gait training, Patient/Family education, Self Care, Joint mobilization, Dry Needling, Manual therapy, and Re-evaluation.   PLAN FOR NEXT SESSION: HEP review and update, ROM and flexibility tasks for all affected regions, posture exercises, core and LE strengthening   Berta Minor PTA 08/18/22  8:25 AM Phone: [phone] Fax: [phone]

## 2022-08-18 ENCOUNTER — Other Ambulatory Visit: Payer: Self-pay

## 2022-08-18 ENCOUNTER — Ambulatory Visit: Payer: Medicaid Other | Attending: Physician Assistant

## 2022-08-18 DIAGNOSIS — M25512 Pain in left shoulder: Secondary | ICD-10-CM | POA: Insufficient documentation

## 2022-08-18 DIAGNOSIS — M545 Low back pain, unspecified: Secondary | ICD-10-CM | POA: Insufficient documentation

## 2022-08-18 DIAGNOSIS — M542 Cervicalgia: Secondary | ICD-10-CM | POA: Insufficient documentation

## 2022-08-18 DIAGNOSIS — R600 Localized edema: Secondary | ICD-10-CM | POA: Diagnosis present

## 2022-08-18 DIAGNOSIS — G8929 Other chronic pain: Secondary | ICD-10-CM | POA: Insufficient documentation

## 2022-08-18 DIAGNOSIS — M25612 Stiffness of left shoulder, not elsewhere classified: Secondary | ICD-10-CM | POA: Insufficient documentation

## 2022-08-18 DIAGNOSIS — M6281 Muscle weakness (generalized): Secondary | ICD-10-CM | POA: Insufficient documentation

## 2022-08-25 ENCOUNTER — Other Ambulatory Visit: Payer: Self-pay

## 2022-08-25 ENCOUNTER — Encounter: Payer: Self-pay | Admitting: Physical Therapy

## 2022-08-25 ENCOUNTER — Ambulatory Visit: Payer: Medicaid Other | Admitting: Physical Therapy

## 2022-08-25 DIAGNOSIS — M542 Cervicalgia: Secondary | ICD-10-CM

## 2022-08-25 DIAGNOSIS — M25612 Stiffness of left shoulder, not elsewhere classified: Secondary | ICD-10-CM

## 2022-08-25 DIAGNOSIS — G8929 Other chronic pain: Secondary | ICD-10-CM

## 2022-08-25 DIAGNOSIS — M545 Low back pain, unspecified: Secondary | ICD-10-CM | POA: Diagnosis not present

## 2022-08-25 DIAGNOSIS — M6281 Muscle weakness (generalized): Secondary | ICD-10-CM

## 2022-08-25 NOTE — Therapy (Signed)
OUTPATIENT PHYSICAL THERAPY TREATMENT NOTE   Patient Name: Curtis Williamson MRN: [id number] DOB:1960-09-18, 62 y.o., male Today's Date: 08/25/2022   PCP: Georganna Skeans, MD  REFERRING PROVIDER: Jamey Reas, PA-C      PT End of Session - 08/25/22 1103     Visit Number 8    Number of Visits 14    Date for PT Re-Evaluation 10/06/22    Authorization Type [employer] MCD    Authorization - Number of Visits 27    PT Start Time 1100    PT Stop Time 1145    PT Time Calculation (min) 45 min    Activity Tolerance Patient tolerated treatment well    Behavior During Therapy WFL for tasks assessed/performed               Past Medical History:  Diagnosis Date   Chronic kidney disease    only has one kidney   Diabetes mellitus without complication (HCC)    Essential hypertension    Hyperlipidemia    Hypertension    [employer] 01/13/2020   Past Surgical History:  Procedure Laterality Date   KIDNEY DONATION     MANDIBLE FRACTURE SURGERY     ROTATOR CUFF REPAIR Right    Patient Active Problem List   Diagnosis Date Noted   Lumbosacral spondylosis without myelopathy 12/13/2021   Low back pain 10/28/2020   Pain due to onychomycosis of toenails of both feet 10/09/2020   Hyperlipidemia 09/22/2020   Thickened nails 09/22/2020   Insomnia 09/22/2020   Acute pain of left shoulder 05/19/2020   Tobacco dependence 03/26/2019   Essential hypertension 03/26/2019   Type 2 diabetes mellitus without complication, without long-term current use of insulin (HCC) 03/26/2019   Aneurysm, cerebral, nonruptured 03/26/2019    THERAPY DIAG:  Chronic bilateral low back pain without sciatica  Cervicalgia  Muscle weakness (generalized)  Stiffness of left shoulder, not elsewhere classified   Rationale for Evaluation and Treatment Rehabilitation  REFERRING DIAG: Cervicalgia   PERTINENT HISTORY: See above   PRECAUTIONS/RESTRICTIONS: None   SUBJECTIVE:  Patient reports right neck  and upper back region are feeling worse this visit. States that he feels like the way he sleeps aggravates his pain.   PAIN:  Are you having pain? Yes:  NPRS scale: 5/10 Pain location: Right neck/upper back Pain description: ache Aggravating factors: activity Relieving factors: pain meds , heat, massage  OBJECTIVE: (objective measures completed at initial evaluation unless otherwise dated) PATIENT SURVEYS:  Modified Oswestry 30/50 (60% perceived disability)   08/25/2022: 22/50 (44%)  NDI: 20/50 (40%) - 08/25/2022   MUSCLE LENGTH: Hamstrings: Right 60 deg; Left 60 deg Thomas test: PKB restricted B   POSTURE: rounded shoulders and forward head   PALPATION: Deferred    LUMBAR ROM: Deferred due to time constraints   AROM eval  Flexion    Extension    Right lateral flexion    Left lateral flexion    Right rotation    Left rotation     (Blank rows = not tested)   LOWER EXTREMITY ROM:   WFL for gait and transfers   Active  Right eval Left eval  Hip flexion      Hip extension      Hip abduction      Hip adduction      Hip internal rotation      Hip external rotation      Knee flexion      Knee extension      Ankle  dorsiflexion      Ankle plantarflexion      Ankle inversion      Ankle eversion       (Blank rows = not tested)   LOWER EXTREMITY MMT:     MMT Right eval Left eval Right 08/18/22 Left 08/18/22 Rt / Lt 08/25/2022  Hip flexion 4 4 4+ p! 4+ 4+ / 4+  Hip extension         Hip abduction 4 4 4+ 4+ 4+ / 4+  Hip adduction         Hip internal rotation         Hip external rotation         Knee flexion 4 4 4 4  4+ / 4+  Knee extension 4 4 4+ 4+ 4+ / 4+  Ankle dorsiflexion         Ankle plantarflexion 4 4     Ankle inversion         Ankle eversion          (Blank rows = not tested)   LUMBAR SPECIAL TESTS:  Straight leg raise test: Negative, Slump test: Positive, and FABER test: Negative positive R slump   FUNCTIONAL TESTS:  5 times sit to stand: 25s arms  crossed   CERVICAL ROM:    Active ROM A/PROM (deg) eval   08/25/2022  Flexion 75% 60  Extension 75% 30  Right lateral flexion 50% 20  Left lateral flexion 25% 10  Right rotation 50%P! 60  Left rotation 90% 45   (Blank rows = not tested)   UPPER EXTREMITY ROM:   Active ROM Right eval Left eval Rt / Lt 08/25/2022  Shoulder flexion 135d 90d 140 / 110  Shoulder extension WNL WNL   Shoulder abduction 150d 100d   Shoulder adduction       Shoulder extension       Shoulder internal rotation       Shoulder external rotation       Elbow flexion       Elbow extension       Wrist flexion       Wrist extension       Wrist ulnar deviation       Wrist radial deviation       Wrist pronation       Wrist supination        (Blank rows = not tested)    GAIT: Distance walked: 96ft x2 Assistive device utilized: None Level of assistance: Complete Independence Comments: slow cadence antalgic gait     TODAY'S TREATMENT TREATMENT 5/9: Therapeutic Exercise: - NuStep L7 x 5 min with LE while taking subjective and planning session with patient - Seated upper trap and levator scap stretch 2 x 15 sec on left - Sidelying thoracic rotation x 10 each - Supine dowel shoulder flexion x 10 - Step back stretch 5 x 5 sec - Doorway pec stretch at 60 deg 3 x 20 sec - Row with blue 2 x 20 Manual: - Skilled palpation and monitoring of muscle tension while performing TPDN - Suboccipital release with manual tractions - Passive upper trap and levator scap stretch Trigger Point Dry Needling Treatment: Pre-treatment instruction: Patient instructed on dry needling rationale, procedures, and possible side effects including pain during treatment (achy,cramping feeling), bruising, drop of blood, lightheadedness, nausea, sweating. Patient Consent Given: Yes Education handout provided: Previously provided Muscles treated: Right upper trap and rhomboid region Needle size and number: .30x44mm x 5 Electrical  stimulation  performed: No Parameters: N/A Treatment response/outcome: Twitch response elicited and Palpable decrease in muscle tension Post-treatment instructions: Patient instructed to expect possible mild to moderate muscle soreness later today and/or tomorrow. Patient instructed in methods to reduce muscle soreness and to continue prescribed HEP. If patient was dry needled over the lung field, patient was instructed on signs and symptoms of pneumothorax and, however unlikely, to see immediate medical attention should they occur. Patient was also educated on signs and symptoms of infection and to seek medical attention should they occur. Patient verbalized understanding of these instructions and education.   TREATMENT 5/2: Therapeutic Exercise: - nu-step L7 21m while taking subjective and planning session with patient - palloff press 7# x10, x5 BIL - rows 17# 2x10 - doorway pec stretch mid/low 2x30" each - seated upper trap stretch 2 x 20 sec BIL - LTR x 10 - SLR x10 BIL - PPT 5" hold x10 - moderate arc bridge - 2x10 - small arc bridge with ball - x10 Manual Therapy: - STM/MTP Rt thoracic/lumbar paraspinals  TREATMENT 4/30: Therapeutic Exercise: - nu-step L7 24m while taking subjective and planning session with patient - seated upper trap stretch 3 x 15 sec on left - sidelying thoracic rotation x 10 each - LTR x 10 - Supine thoracic extension of FR at various levels - doorway pec stretch at 60 deg 3 x 20 sec - row with blue x 15 - extension with red x 15 - double ER and scap retraction with blue x 15 Manual: - skilled palpation and monitoring of muscle tension while performing TPDN - STM left upper trap region Trigger Point Dry Needling Treatment: Pre-treatment instruction: Patient instructed on dry needling rationale, procedures, and possible side effects including pain during treatment (achy,cramping feeling), bruising, drop of blood, lightheadedness, nausea, sweating. Patient  Consent Given: Yes Education handout provided: Previously provided Muscles treated: Left upper trap, right thoracic multifidi  Needle size and number: .30x8mm x 2 and .30x73mm x 3 Electrical stimulation performed: No Parameters: N/A Treatment response/outcome: Twitch response elicited and Palpable decrease in muscle tension Post-treatment instructions: Patient instructed to expect possible mild to moderate muscle soreness later today and/or tomorrow. Patient instructed in methods to reduce muscle soreness and to continue prescribed HEP. If patient was dry needled over the lung field, patient was instructed on signs and symptoms of pneumothorax and, however unlikely, to see immediate medical attention should they occur. Patient was also educated on signs and symptoms of infection and to seek medical attention should they occur. Patient verbalized understanding of these instructions and education.  TREATMENT 4/25: Therapeutic Exercise: - nu-step L7 71m while taking subjective and planning session with patient - horizontal abd - Blue TB - 2x10 - palloff press 10# 2x10 BIL - rows 17# 2x10 - S/L clam - GTB - 2x10 - moderate arc bridge - 2x10 - small arc bridge with ball - x10 - supine hip adduction ball squeeze 5" hold 2x10 - LTR x10 BIL Manual Therapy: - Sub occipital release - STM bil UT and sub occipitals, Rt lumbar paraspinals  PATIENT EDUCATION:  Education details: POC extension, TPDN, HEP update Person educated: Patient Education method: Explanation, Handout Education comprehension: verbalized understanding and needs further education   HOME EXERCISE PROGRAM: Access Code: [email or web address]   ASSESSMENT: CLINICAL IMPRESSION: Patient tolerated therapy well with no adverse effects. He reports primarily worsening neck and upper back pain but low back pain has improved. He has achieved LTG for low back functional ability and LE strength, but  continues to exhibit limitations with neck and  shoulder motion. He notes limitations in his neck functional ability on NDI. Therapy continues to utilize TPDN and progression postural mobility and strengthening. Updated his HEP this visit with good tolerance. Patient continues to benefit from skilled PT services and should be progressed as able to improve functional independence, so will extend PT POC.   OBJECTIVE IMPAIRMENTS: Abnormal gait, decreased activity tolerance, decreased endurance, decreased mobility, difficulty walking, decreased ROM, decreased strength, impaired flexibility, impaired UE functional use, improper body mechanics, postural dysfunction, and pain.    ACTIVITY LIMITATIONS: carrying, lifting, bending, sitting, standing, squatting, sleeping, and stairs   PERSONAL FACTORS: Age, Fitness, Past/current experiences, Time since onset of injury/illness/exacerbation, and B RC repairs  are also affecting patient's functional outcome.      GOALS: Goals reviewed with patient? No   SHORT TERM GOALS: Target date: 07/27/2022   Patient to demonstrate independence in HEP  Baseline: [email or web address] Goal status: MET   2.  Assess trunk mobility Baseline: TBD 4/10: limited all planes Goal status: MET   LONG TERM GOALS: Target date: 10/06/2022   Decrease ODI score to 22/50 Baseline: 30/50 08/25/2022: 22/50 Goal status: MET   2.  Increase BLE strength to 4+/5 Baseline:  MMT Right eval Left eval  Hip flexion 4 4  Hip extension      Hip abduction 4 4  Hip adduction      Hip internal rotation      Hip external rotation      Knee flexion 4 4  Knee extension 4 4  Ankle dorsiflexion      Ankle plantarflexion 4 4     08/25/2022: grossly 4+/5 MMT Goal status: MET   3.  Increase cervical ROM to 75% globally Baseline:  Active ROM A/PROM (deg) eval  Flexion 75%  Extension 75%  Right lateral flexion 50%  Left lateral flexion 25%  Right rotation 50%P!  Left rotation 90%   08/25/2022: patient continues to demonstrate limitations  noted above Goal status: NOT MET   4.  Increase B shoulder flexion and abduction to 160d Baseline:  Active ROM Right eval Left eval  Shoulder flexion 135d 90d  Shoulder extension WNL WNL  Shoulder abduction 150d 100d   08/25/2022: patient continues to demonstrate limitations noted above Goal status: NOT MET  5. Decrease NDI score to 15/50 Baseline: 20/50 Goal status: INITIAL       PLAN: PT FREQUENCY: 1x/week   PT DURATION: 6 weeks   PLANNED INTERVENTIONS: Therapeutic exercises, Therapeutic activity, Neuromuscular re-education, Balance training, Gait training, Patient/Family education, Self Care, Joint mobilization, Dry Needling, Manual therapy, and Re-evaluation.   PLAN FOR NEXT SESSION: HEP review and update, ROM and flexibility tasks for all affected regions, posture exercises, core and LE strengthening   Hilbert Bible PT 08/25/22  3:08 PM Phone: [phone] Fax: [phone]

## 2022-08-26 DIAGNOSIS — M5430 Sciatica, unspecified side: Secondary | ICD-10-CM | POA: Diagnosis not present

## 2022-08-26 DIAGNOSIS — R030 Elevated blood-pressure reading, without diagnosis of hypertension: Secondary | ICD-10-CM | POA: Diagnosis not present

## 2022-08-26 DIAGNOSIS — Z6826 Body mass index (BMI) 26.0-26.9, adult: Secondary | ICD-10-CM | POA: Diagnosis not present

## 2022-08-26 DIAGNOSIS — M545 Low back pain, unspecified: Secondary | ICD-10-CM | POA: Diagnosis not present

## 2022-08-26 DIAGNOSIS — M25551 Pain in right hip: Secondary | ICD-10-CM | POA: Diagnosis not present

## 2022-08-26 DIAGNOSIS — E119 Type 2 diabetes mellitus without complications: Secondary | ICD-10-CM | POA: Diagnosis not present

## 2022-08-26 DIAGNOSIS — Z9181 History of falling: Secondary | ICD-10-CM | POA: Diagnosis not present

## 2022-08-26 DIAGNOSIS — Z79899 Other long term (current) drug therapy: Secondary | ICD-10-CM | POA: Diagnosis not present

## 2022-08-30 DIAGNOSIS — Z79899 Other long term (current) drug therapy: Secondary | ICD-10-CM | POA: Diagnosis not present

## 2022-08-31 ENCOUNTER — Ambulatory Visit: Payer: Medicaid Other | Admitting: Physical Therapy
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
## 2022-09-05 NOTE — Therapy (Unsigned)
OUTPATIENT PHYSICAL THERAPY TREATMENT NOTE   Patient Name: Curtis Williamson MRN: [id number] DOB:February 14, 1961, 62 y.o., male Today's Date: 09/07/2022   PCP: Georganna Skeans, MD  REFERRING PROVIDER: Jamey Reas, PA-C      PT End of Session - 09/07/22 0915     Visit Number 9    Number of Visits 14    Date for PT Re-Evaluation 10/06/22    Authorization Type [employer] MCD    Authorization - Number of Visits 27    PT Start Time 0915    PT Stop Time 0955    PT Time Calculation (min) 40 min    Activity Tolerance Patient tolerated treatment well    Behavior During Therapy [facility] for tasks assessed/performed                Past Medical History:  Diagnosis Date   Chronic kidney disease    only has one kidney   Diabetes mellitus without complication (HCC)    Essential hypertension    Hyperlipidemia    Hypertension    [employer] 01/13/2020   Past Surgical History:  Procedure Laterality Date   KIDNEY DONATION     MANDIBLE FRACTURE SURGERY     ROTATOR CUFF REPAIR Right    Patient Active Problem List   Diagnosis Date Noted   Lumbosacral spondylosis without myelopathy 12/13/2021   Low back pain 10/28/2020   Pain due to onychomycosis of toenails of both feet 10/09/2020   Hyperlipidemia 09/22/2020   Thickened nails 09/22/2020   Insomnia 09/22/2020   Acute pain of left shoulder 05/19/2020   Tobacco dependence 03/26/2019   Essential hypertension 03/26/2019   Type 2 diabetes mellitus without complication, without long-term current use of insulin (HCC) 03/26/2019   Aneurysm, cerebral, nonruptured 03/26/2019    THERAPY DIAG:  Chronic bilateral low back pain without sciatica  Cervicalgia  Muscle weakness (generalized)  Acute pain of left shoulder   Rationale for Evaluation and Treatment Rehabilitation  REFERRING DIAG: Cervicalgia   PERTINENT HISTORY: See above   PRECAUTIONS/RESTRICTIONS: None   SUBJECTIVE: Continuing fluctuating symptoms but overall feels  improved.  Recent f/u with MD recommended 4 additional PT sessions.  Today symptoms cited to R gluteal and L shoulder region.  Had a family meber offer massage and deep tissue work which was beneficial  PAIN:  Are you having pain? Yes:  NPRS scale: 5/10 Pain location: Right neck/upper back Pain description: ache Aggravating factors: activity Relieving factors: pain meds , heat, massage  OBJECTIVE: (objective measures completed at initial evaluation unless otherwise dated) PATIENT SURVEYS:  Modified Oswestry 30/50 (60% perceived disability)   08/25/2022: 22/50 (44%)  NDI: 20/50 (40%) - 08/25/2022   MUSCLE LENGTH: Hamstrings: Right 60 deg; Left 60 deg Thomas test: PKB restricted B   POSTURE: rounded shoulders and forward head   PALPATION: Deferred    LUMBAR ROM: Deferred due to time constraints   AROM eval  Flexion    Extension    Right lateral flexion    Left lateral flexion    Right rotation    Left rotation     (Blank rows = not tested)   LOWER EXTREMITY ROM:   WFL for gait and transfers   Active  Right eval Left eval  Hip flexion      Hip extension      Hip abduction      Hip adduction      Hip internal rotation      Hip external rotation      Knee  flexion      Knee extension      Ankle dorsiflexion      Ankle plantarflexion      Ankle inversion      Ankle eversion       (Blank rows = not tested)   LOWER EXTREMITY MMT:     MMT Right eval Left eval Right 08/18/22 Left 08/18/22 Rt / Lt 08/25/2022  Hip flexion 4 4 4+ p! 4+ 4+ / 4+  Hip extension         Hip abduction 4 4 4+ 4+ 4+ / 4+  Hip adduction         Hip internal rotation         Hip external rotation         Knee flexion 4 4 4 4  4+ / 4+  Knee extension 4 4 4+ 4+ 4+ / 4+  Ankle dorsiflexion         Ankle plantarflexion 4 4     Ankle inversion         Ankle eversion          (Blank rows = not tested)   LUMBAR SPECIAL TESTS:  Straight leg raise test: Negative, Slump test: Positive, and FABER  test: Negative positive R slump   FUNCTIONAL TESTS:  5 times sit to stand: 25s arms crossed   CERVICAL ROM:    Active ROM A/PROM (deg) eval   08/25/2022  Flexion 75% 60  Extension 75% 30  Right lateral flexion 50% 20  Left lateral flexion 25% 10  Right rotation 50%P! 60  Left rotation 90% 45   (Blank rows = not tested)   UPPER EXTREMITY ROM:   Active ROM Right eval Left eval Rt / Lt 08/25/2022  Shoulder flexion 135d 90d 140 / 110  Shoulder extension WNL WNL   Shoulder abduction 150d 100d   Shoulder adduction       Shoulder extension       Shoulder internal rotation       Shoulder external rotation       Elbow flexion       Elbow extension       Wrist flexion       Wrist extension       Wrist ulnar deviation       Wrist radial deviation       Wrist pronation       Wrist supination        (Blank rows = not tested)    GAIT: Distance walked: 33ft x2 Assistive device utilized: None Level of assistance: Complete Independence Comments: slow cadence antalgic gait     TODAY'S TREATMENT [facility] Adult PT Treatment:                                                DATE: 09/07/22 Therapeutic Exercise: Recumbent bike L2 8 min Manual Therapy: L pec monor release, L first rib mobilization to promote L shoulder flexion, MWMs into shoulder flexion 10x Trigger Point Dry Needling Treatment: Pre-treatment instruction: Patient instructed on dry needling rationale, procedures, and possible side effects including pain during treatment (achy,cramping feeling), bruising, drop of blood, lightheadedness, nausea, sweating. Patient Consent Given: Yes Education handout provided: Previously provided Muscles treated: B teres major/minor  Needle size and number: .25x65mm x 2 Electrical stimulation performed: No Parameters: N/A Treatment response/outcome: Twitch response elicited,  Palpable decrease in muscle tension, and improved posture, pain and mobility Post-treatment instructions: Patient  instructed to expect possible mild to moderate muscle soreness later today and/or tomorrow. Patient instructed in methods to reduce muscle soreness and to continue prescribed HEP. If patient was dry needled over the lung field, patient was instructed on signs and symptoms of pneumothorax and, however unlikely, to see immediate medical attention should they occur. Patient was also educated on signs and symptoms of infection and to seek medical attention should they occur. Patient verbalized understanding of these instructions and education.     TREATMENT 5/9: Therapeutic Exercise: - NuStep L7 x 5 min with LE while taking subjective and planning session with patient - Seated upper trap and levator scap stretch 2 x 15 sec on left - Sidelying thoracic rotation x 10 each - Supine dowel shoulder flexion x 10 - Step back stretch 5 x 5 sec - Doorway pec stretch at 60 deg 3 x 20 sec - Row with blue 2 x 20 Manual: - Skilled palpation and monitoring of muscle tension while performing TPDN - Suboccipital release with manual tractions - Passive upper trap and levator scap stretch Trigger Point Dry Needling Treatment: Pre-treatment instruction: Patient instructed on dry needling rationale, procedures, and possible side effects including pain during treatment (achy,cramping feeling), bruising, drop of blood, lightheadedness, nausea, sweating. Patient Consent Given: Yes Education handout provided: Previously provided Muscles treated: Right upper trap and rhomboid region Needle size and number: .30x37mm x 5 Electrical stimulation performed: No Parameters: N/A Treatment response/outcome: Twitch response elicited and Palpable decrease in muscle tension Post-treatment instructions: Patient instructed to expect possible mild to moderate muscle soreness later today and/or tomorrow. Patient instructed in methods to reduce muscle soreness and to continue prescribed HEP. If patient was dry needled over the lung  field, patient was instructed on signs and symptoms of pneumothorax and, however unlikely, to see immediate medical attention should they occur. Patient was also educated on signs and symptoms of infection and to seek medical attention should they occur. Patient verbalized understanding of these instructions and education.   TREATMENT 5/2: Therapeutic Exercise: - nu-step L7 42m while taking subjective and planning session with patient - palloff press 7# x10, x5 BIL - rows 17# 2x10 - doorway pec stretch mid/low 2x30" each - seated upper trap stretch 2 x 20 sec BIL - LTR x 10 - SLR x10 BIL - PPT 5" hold x10 - moderate arc bridge - 2x10 - small arc bridge with ball - x10 Manual Therapy: - STM/MTP Rt thoracic/lumbar paraspinals  TREATMENT 4/30: Therapeutic Exercise: - nu-step L7 78m while taking subjective and planning session with patient - seated upper trap stretch 3 x 15 sec on left - sidelying thoracic rotation x 10 each - LTR x 10 - Supine thoracic extension of FR at various levels - doorway pec stretch at 60 deg 3 x 20 sec - row with blue x 15 - extension with red x 15 - double ER and scap retraction with blue x 15 Manual: - skilled palpation and monitoring of muscle tension while performing TPDN - STM left upper trap region Trigger Point Dry Needling Treatment: Pre-treatment instruction: Patient instructed on dry needling rationale, procedures, and possible side effects including pain during treatment (achy,cramping feeling), bruising, drop of blood, lightheadedness, nausea, sweating. Patient Consent Given: Yes Education handout provided: Previously provided Muscles treated: Left upper trap, right thoracic multifidi  Needle size and number: .30x43mm x 2 and .30x41mm x 3 Electrical stimulation performed: No  Parameters: N/A Treatment response/outcome: Twitch response elicited and Palpable decrease in muscle tension Post-treatment instructions: Patient instructed to expect  possible mild to moderate muscle soreness later today and/or tomorrow. Patient instructed in methods to reduce muscle soreness and to continue prescribed HEP. If patient was dry needled over the lung field, patient was instructed on signs and symptoms of pneumothorax and, however unlikely, to see immediate medical attention should they occur. Patient was also educated on signs and symptoms of infection and to seek medical attention should they occur. Patient verbalized understanding of these instructions and education.  TREATMENT 4/25: Therapeutic Exercise: - nu-step L7 79m while taking subjective and planning session with patient - horizontal abd - Blue TB - 2x10 - palloff press 10# 2x10 BIL - rows 17# 2x10 - S/L clam - GTB - 2x10 - moderate arc bridge - 2x10 - small arc bridge with ball - x10 - supine hip adduction ball squeeze 5" hold 2x10 - LTR x10 BIL Manual Therapy: - Sub occipital release - STM bil UT and sub occipitals, Rt lumbar paraspinals  PATIENT EDUCATION:  Education details: POC extension, TPDN, HEP update Person educated: Patient Education method: Explanation, Handout Education comprehension: verbalized understanding and needs further education   HOME EXERCISE PROGRAM: Access Code: [email or web address]   ASSESSMENT: CLINICAL IMPRESSION: Discussed alternatives to formal PT including fitness clubs.  He has a Research scientist (physical sciences) to [employer] which includes hydromassage and recommended activities to perform and which to avoid.  Good response to TPDn and maual techniques as shoulder ROM and mobility increased.   OBJECTIVE IMPAIRMENTS: Abnormal gait, decreased activity tolerance, decreased endurance, decreased mobility, difficulty walking, decreased ROM, decreased strength, impaired flexibility, impaired UE functional use, improper body mechanics, postural dysfunction, and pain.    ACTIVITY LIMITATIONS: carrying, lifting, bending, sitting, standing, squatting, sleeping, and stairs    PERSONAL FACTORS: Age, Fitness, Past/current experiences, Time since onset of injury/illness/exacerbation, and B RC repairs  are also affecting patient's functional outcome.      GOALS: Goals reviewed with patient? No   SHORT TERM GOALS: Target date: 07/27/2022   Patient to demonstrate independence in HEP  Baseline: [email or web address] Goal status: MET   2.  Assess trunk mobility Baseline: TBD 4/10: limited all planes Goal status: MET   LONG TERM GOALS: Target date: 10/06/2022   Decrease ODI score to 22/50 Baseline: 30/50 08/25/2022: 22/50 Goal status: MET   2.  Increase BLE strength to 4+/5 Baseline:  MMT Right eval Left eval  Hip flexion 4 4  Hip extension      Hip abduction 4 4  Hip adduction      Hip internal rotation      Hip external rotation      Knee flexion 4 4  Knee extension 4 4  Ankle dorsiflexion      Ankle plantarflexion 4 4     08/25/2022: grossly 4+/5 MMT Goal status: MET   3.  Increase cervical ROM to 75% globally Baseline:  Active ROM A/PROM (deg) eval  Flexion 75%  Extension 75%  Right lateral flexion 50%  Left lateral flexion 25%  Right rotation 50%P!  Left rotation 90%   08/25/2022: patient continues to demonstrate limitations noted above Goal status: NOT MET   4.  Increase B shoulder flexion and abduction to 160d Baseline:  Active ROM Right eval Left eval  Shoulder flexion 135d 90d  Shoulder extension WNL WNL  Shoulder abduction 150d 100d   08/25/2022: patient continues to demonstrate limitations noted above Goal status: NOT MET  5. Decrease NDI score to 15/50 Baseline: 20/50 Goal status: INITIAL       PLAN: PT FREQUENCY: 1x/week   PT DURATION: 6 weeks   PLANNED INTERVENTIONS: Therapeutic exercises, Therapeutic activity, Neuromuscular re-education, Balance training, Gait training, Patient/Family education, Self Care, Joint mobilization, Dry Needling, Manual therapy, and Re-evaluation.   PLAN FOR NEXT SESSION: HEP review and update,  ROM and flexibility tasks for all affected regions, posture exercises, core and LE strengthening   Hildred Laser PT 09/07/22  9:15 AM Phone: [phone] Fax: [phone]

## 2022-09-07 ENCOUNTER — Ambulatory Visit: Payer: Medicaid Other

## 2022-09-07 DIAGNOSIS — M25512 Pain in left shoulder: Secondary | ICD-10-CM

## 2022-09-07 DIAGNOSIS — M6281 Muscle weakness (generalized): Secondary | ICD-10-CM

## 2022-09-07 DIAGNOSIS — M545 Low back pain, unspecified: Secondary | ICD-10-CM | POA: Diagnosis not present

## 2022-09-07 DIAGNOSIS — M542 Cervicalgia: Secondary | ICD-10-CM

## 2022-09-13 NOTE — Therapy (Signed)
OUTPATIENT PHYSICAL THERAPY TREATMENT NOTE   Patient Name: Curtis Williamson MRN: [id number] DOB:1960/09/08, 62 y.o., male Today's Date: 09/14/2022   PCP: Georganna Skeans, MD  REFERRING PROVIDER: Jamey Reas, PA-C      PT End of Session - 09/14/22 0809     Visit Number 10    Number of Visits 14    Date for PT Re-Evaluation 10/06/22    Authorization Type [employer] MCD    Authorization - Number of Visits 27    PT Start Time 0802    PT Stop Time 0845    PT Time Calculation (min) 43 min    Activity Tolerance Patient tolerated treatment well    Behavior During Therapy WFL for tasks assessed/performed                 Past Medical History:  Diagnosis Date   Chronic kidney disease    only has one kidney   Diabetes mellitus without complication (HCC)    Essential hypertension    Hyperlipidemia    Hypertension    [employer] 01/13/2020   Past Surgical History:  Procedure Laterality Date   KIDNEY DONATION     MANDIBLE FRACTURE SURGERY     ROTATOR CUFF REPAIR Right    Patient Active Problem List   Diagnosis Date Noted   Lumbosacral spondylosis without myelopathy 12/13/2021   Low back pain 10/28/2020   Pain due to onychomycosis of toenails of both feet 10/09/2020   Hyperlipidemia 09/22/2020   Thickened nails 09/22/2020   Insomnia 09/22/2020   Acute pain of left shoulder 05/19/2020   Tobacco dependence 03/26/2019   Essential hypertension 03/26/2019   Type 2 diabetes mellitus without complication, without long-term current use of insulin (HCC) 03/26/2019   Aneurysm, cerebral, nonruptured 03/26/2019    THERAPY DIAG:  Chronic bilateral low back pain without sciatica  Cervicalgia  Muscle weakness (generalized)  Acute pain of left shoulder  Stiffness of left shoulder, not elsewhere classified  Localized edema   Rationale for Evaluation and Treatment Rehabilitation  REFERRING DIAG: Cervicalgia   PERTINENT HISTORY: See above    PRECAUTIONS/RESTRICTIONS: None   SUBJECTIVE: Patient reports he is not doing too good. He has been having pain on the right side from the shoulder all the way down his back to the back of the thigh and gluteal region. He reports almost no pain after last visit but the cooking and standing he did over the weekend aggravated his pain. He has been doing about 10 minutes of the low impact bike at home and that is feeling pretty good.  PAIN:  Are you having pain? Yes:  NPRS scale: 7/10 Pain location: Right neck/upper back, gluteal region Pain description: ache Aggravating factors: activity Relieving factors: pain meds , heat, massage   OBJECTIVE: (objective measures completed at initial evaluation unless otherwise dated) PATIENT SURVEYS:  Modified Oswestry 30/50 (60% perceived disability)   08/25/2022: 22/50 (44%)  NDI: 20/50 (40%) - 08/25/2022   MUSCLE LENGTH: Hamstrings: Right 60 deg; Left 60 deg Thomas test: PKB restricted B   POSTURE: rounded shoulders and forward head   PALPATION: Deferred    LUMBAR ROM: Deferred due to time constraints   AROM eval  Flexion    Extension    Right lateral flexion    Left lateral flexion    Right rotation    Left rotation     (Blank rows = not tested)   LOWER EXTREMITY ROM:  WFL for gait and transfers   Active  Right eval  Left eval  Hip flexion      Hip extension      Hip abduction      Hip adduction      Hip internal rotation      Hip external rotation      Knee flexion      Knee extension      Ankle dorsiflexion      Ankle plantarflexion      Ankle inversion      Ankle eversion       (Blank rows = not tested)   LOWER EXTREMITY MMT:     MMT Right eval Left eval Right 08/18/22 Left 08/18/22 Rt / Lt 08/25/2022  Hip flexion 4 4 4+ p! 4+ 4+ / 4+  Hip extension         Hip abduction 4 4 4+ 4+ 4+ / 4+  Hip adduction         Hip internal rotation         Hip external rotation         Knee flexion 4 4 4 4  4+ / 4+  Knee  extension 4 4 4+ 4+ 4+ / 4+  Ankle dorsiflexion         Ankle plantarflexion 4 4     Ankle inversion         Ankle eversion          (Blank rows = not tested)   LUMBAR SPECIAL TESTS:  Straight leg raise test: Negative, Slump test: Positive, and FABER test: Negative positive R slump   FUNCTIONAL TESTS:  5 times sit to stand: 25s arms crossed   CERVICAL ROM:    Active ROM A/PROM (deg) eval   08/25/2022  Flexion 75% 60  Extension 75% 30  Right lateral flexion 50% 20  Left lateral flexion 25% 10  Right rotation 50%P! 60  Left rotation 90% 45   (Blank rows = not tested)   UPPER EXTREMITY ROM:   Active ROM Right eval Left eval Rt / Lt 08/25/2022  Shoulder flexion 135d 90d 140 / 110  Shoulder extension WNL WNL   Shoulder abduction 150d 100d   Shoulder adduction       Shoulder extension       Shoulder internal rotation       Shoulder external rotation       Elbow flexion       Elbow extension       Wrist flexion       Wrist extension       Wrist ulnar deviation       Wrist radial deviation       Wrist pronation       Wrist supination        (Blank rows = not tested)    GAIT: Distance walked: 73ft x2 Assistive device utilized: None Level of assistance: Complete Independence Comments: slow cadence antalgic gait     TODAY'S TREATMENT [facility] Adult PT Treatment:                                                DATE: 09/14/2022 Therapeutic Exercise: NuStep L7 x 5 min with LE while taking subjective and planning session with patient Piriformis stretch 2 x 30 sec each LTR x 10 Bridge x 10 Sidelying thoracic rotation x 10 each Clamshell x 20 Doorway pec stretch at  60 deg 3 x 20 sec Row with blue x 20 Manual: Skilled palpation and monitoring of muscle tension while performing TPDN TPR for right posterior shoulder and gluteal region Trigger Point Dry Needling Treatment: Pre-treatment instruction: Patient instructed on dry needling rationale, procedures, and possible side  effects including pain during treatment (achy,cramping feeling), bruising, drop of blood, lightheadedness, nausea, sweating. Patient Consent Given: Yes Education handout provided: Previously provided Muscles treated: Right teres major/minor, lat, rhomboid, lumbar multifidi and piriformis Needle size and number: .30x39mm x 6, .30x72mm x 3 Electrical stimulation performed: No Parameters: N/A Treatment response/outcome: Twitch response elicited and Palpable decrease in muscle tension Post-treatment instructions: Patient instructed to expect possible mild to moderate muscle soreness later today and/or tomorrow. Patient instructed in methods to reduce muscle soreness and to continue prescribed HEP. If patient was dry needled over the lung field, patient was instructed on signs and symptoms of pneumothorax and, however unlikely, to see immediate medical attention should they occur. Patient was also educated on signs and symptoms of infection and to seek medical attention should they occur. Patient verbalized understanding of these instructions and education.   [facility] Adult PT Treatment:                                                DATE: 09/07/22 Therapeutic Exercise: Recumbent bike L2 8 min Manual Therapy: L pec monor release, L first rib mobilization to promote L shoulder flexion, MWMs into shoulder flexion 10x Trigger Point Dry Needling Treatment: Pre-treatment instruction: Patient instructed on dry needling rationale, procedures, and possible side effects including pain during treatment (achy,cramping feeling), bruising, drop of blood, lightheadedness, nausea, sweating. Patient Consent Given: Yes Education handout provided: Previously provided Muscles treated: B teres major/minor  Needle size and number: .25x23mm x 2 Electrical stimulation performed: No Parameters: N/A Treatment response/outcome: Twitch response elicited, Palpable decrease in muscle tension, and improved posture, pain and  mobility Post-treatment instructions: Patient instructed to expect possible mild to moderate muscle soreness later today and/or tomorrow. Patient instructed in methods to reduce muscle soreness and to continue prescribed HEP. If patient was dry needled over the lung field, patient was instructed on signs and symptoms of pneumothorax and, however unlikely, to see immediate medical attention should they occur. Patient was also educated on signs and symptoms of infection and to seek medical attention should they occur. Patient verbalized understanding of these instructions and education.    TREATMENT 5/9: Therapeutic Exercise: - NuStep L7 x 5 min with LE while taking subjective and planning session with patient - Seated upper trap and levator scap stretch 2 x 15 sec on left - Sidelying thoracic rotation x 10 each - Supine dowel shoulder flexion x 10 - Step back stretch 5 x 5 sec - Doorway pec stretch at 60 deg 3 x 20 sec - Row with blue 2 x 20 Manual: - Skilled palpation and monitoring of muscle tension while performing TPDN - Suboccipital release with manual tractions - Passive upper trap and levator scap stretch Trigger Point Dry Needling Treatment: Pre-treatment instruction: Patient instructed on dry needling rationale, procedures, and possible side effects including pain during treatment (achy,cramping feeling), bruising, drop of blood, lightheadedness, nausea, sweating. Patient Consent Given: Yes Education handout provided: Previously provided Muscles treated: Right upper trap and rhomboid region Needle size and number: .30x28mm x 5 Electrical stimulation performed: No Parameters: N/A Treatment  response/outcome: Twitch response elicited and Palpable decrease in muscle tension Post-treatment instructions: Patient instructed to expect possible mild to moderate muscle soreness later today and/or tomorrow. Patient instructed in methods to reduce muscle soreness and to continue prescribed HEP.  If patient was dry needled over the lung field, patient was instructed on signs and symptoms of pneumothorax and, however unlikely, to see immediate medical attention should they occur. Patient was also educated on signs and symptoms of infection and to seek medical attention should they occur. Patient verbalized understanding of these instructions and education.  PATIENT EDUCATION:  Education details: TPDN, HEP update Person educated: Patient Education method: Explanation, Handout Education comprehension: verbalized understanding and needs further education   HOME EXERCISE PROGRAM: Access Code: [email or web address]   ASSESSMENT: CLINICAL IMPRESSION:  Patient tolerated therapy well with no adverse effects. Continued with TPDN for the right posterior shoulder, lumbar and piriformis region with good therapeutic benefit. Therapy focused on incorporating more lumbar and hip stretching and strengthening, and continued upper back mobility and postural strengthening. Updated his HEP to progress stretching and strengthening for home. Patient continues to benefit from skilled PT services and should be progressed as able to improve functional independence.   OBJECTIVE IMPAIRMENTS: Abnormal gait, decreased activity tolerance, decreased endurance, decreased mobility, difficulty walking, decreased ROM, decreased strength, impaired flexibility, impaired UE functional use, improper body mechanics, postural dysfunction, and pain.    ACTIVITY LIMITATIONS: carrying, lifting, bending, sitting, standing, squatting, sleeping, and stairs   PERSONAL FACTORS: Age, Fitness, Past/current experiences, Time since onset of injury/illness/exacerbation, and B RC repairs  are also affecting patient's functional outcome.      GOALS: Goals reviewed with patient? No   SHORT TERM GOALS: Target date: 07/27/2022   Patient to demonstrate independence in HEP  Baseline: [email or web address] Goal status: MET   2.  Assess trunk mobility Baseline:  TBD 4/10: limited all planes Goal status: MET   LONG TERM GOALS: Target date: 10/06/2022   Decrease ODI score to 22/50 Baseline: 30/50 08/25/2022: 22/50 Goal status: MET   2.  Increase BLE strength to 4+/5 Baseline:  MMT Right eval Left eval  Hip flexion 4 4  Hip extension      Hip abduction 4 4  Hip adduction      Hip internal rotation      Hip external rotation      Knee flexion 4 4  Knee extension 4 4  Ankle dorsiflexion      Ankle plantarflexion 4 4     08/25/2022: grossly 4+/5 MMT Goal status: MET   3.  Increase cervical ROM to 75% globally Baseline:  Active ROM A/PROM (deg) eval  Flexion 75%  Extension 75%  Right lateral flexion 50%  Left lateral flexion 25%  Right rotation 50%P!  Left rotation 90%   08/25/2022: patient continues to demonstrate limitations noted above Goal status: NOT MET   4.  Increase B shoulder flexion and abduction to 160d Baseline:  Active ROM Right eval Left eval  Shoulder flexion 135d 90d  Shoulder extension WNL WNL  Shoulder abduction 150d 100d   08/25/2022: patient continues to demonstrate limitations noted above Goal status: NOT MET  5. Decrease NDI score to 15/50 Baseline: 20/50 Goal status: INITIAL       PLAN: PT FREQUENCY: 1x/week   PT DURATION: 6 weeks   PLANNED INTERVENTIONS: Therapeutic exercises, Therapeutic activity, Neuromuscular re-education, Balance training, Gait training, Patient/Family education, Self Care, Joint mobilization, Dry Needling, Manual therapy, and Re-evaluation.   PLAN FOR NEXT SESSION: HEP  review and update, ROM and flexibility tasks for all affected regions, posture exercises, core and LE strengthening   Hilbert Bible PT 09/14/22  9:06 AM Phone: [phone] Fax: [phone]

## 2022-09-14 ENCOUNTER — Encounter: Payer: Self-pay | Admitting: Physical Therapy

## 2022-09-14 ENCOUNTER — Other Ambulatory Visit: Payer: Self-pay

## 2022-09-14 ENCOUNTER — Ambulatory Visit: Payer: Medicaid Other | Admitting: Physical Therapy

## 2022-09-14 DIAGNOSIS — R600 Localized edema: Secondary | ICD-10-CM

## 2022-09-14 DIAGNOSIS — M545 Low back pain, unspecified: Secondary | ICD-10-CM | POA: Diagnosis not present

## 2022-09-14 DIAGNOSIS — M25612 Stiffness of left shoulder, not elsewhere classified: Secondary | ICD-10-CM

## 2022-09-14 DIAGNOSIS — M6281 Muscle weakness (generalized): Secondary | ICD-10-CM

## 2022-09-14 DIAGNOSIS — M25512 Pain in left shoulder: Secondary | ICD-10-CM

## 2022-09-14 DIAGNOSIS — G8929 Other chronic pain: Secondary | ICD-10-CM

## 2022-09-14 DIAGNOSIS — M542 Cervicalgia: Secondary | ICD-10-CM

## 2022-09-14 NOTE — Patient Instructions (Signed)
Access Code: [facility] URL: [email or web address] Date: 09/14/2022 Prepared by: Rosana Hoes  Exercises - Supine Piriformis Stretch with Foot on Ground  - 1 x daily - 5 x weekly - 3 reps - 30 seconds hold - Supine Lower Trunk Rotation  - 1 x daily - 5 x weekly - 10 reps - Bridge  - 1 x daily - 3 x weekly - 3 sets - 10 reps - Clamshell  - 1 x daily - 3 x weekly - 3 sets - 20 reps - Sidelying Thoracic Lumbar Rotation  - 1 x daily - 5 x weekly - 10 reps - Seated Cervical Sidebending Stretch  - 1 x daily - 5 x weekly - 3 reps - 30 seconds hold - Seated Levator Scapulae Stretch  - 1 x daily - 5 x weekly - 3 reps - 30 seconds hold - Doorway Pec Stretch at 60 Elevation  - 1 x daily - 5 x weekly - 3 reps - 30 seconds hold - Standing Row with Anchored Resistance  - 1 x daily - 3 x weekly - 3 sets - 20 reps - Step Back Shoulder Stretch with Chair  - 1 x daily - 5 x weekly - 10 reps - 5 seconds hold - Sit to Stand Without Arm Support  - 1 x daily - 3 x weekly - 3 sets - 10 reps

## 2022-09-20 NOTE — Therapy (Signed)
OUTPATIENT PHYSICAL THERAPY TREATMENT NOTE   Patient Name: Curtis Williamson MRN: [id number] DOB:09/03/1960, 62 y.o., male Today's Date: 09/21/2022   PCP: Georganna Skeans, MD  REFERRING PROVIDER: Jamey Reas, PA-C    PT End of Session - 09/21/22 [phone]     Visit Number 11    Number of Visits 14    Date for PT Re-Evaluation 10/06/22    Authorization Type [employer] MCD    Authorization - Number of Visits 27    PT Start Time 0837    PT Stop Time 0915    PT Time Calculation (min) 38 min    Activity Tolerance Patient tolerated treatment well    Behavior During Therapy [facility] for tasks assessed/performed             Past Medical History:  Diagnosis Date   Chronic kidney disease    only has one kidney   Diabetes mellitus without complication (HCC)    Essential hypertension    Hyperlipidemia    Hypertension    Phreesia 01/13/2020   Past Surgical History:  Procedure Laterality Date   KIDNEY DONATION     MANDIBLE FRACTURE SURGERY     ROTATOR CUFF REPAIR Right    Patient Active Problem List   Diagnosis Date Noted   Lumbosacral spondylosis without myelopathy 12/13/2021   Low back pain 10/28/2020   Pain due to onychomycosis of toenails of both feet 10/09/2020   Hyperlipidemia 09/22/2020   Thickened nails 09/22/2020   Insomnia 09/22/2020   Acute pain of left shoulder 05/19/2020   Tobacco dependence 03/26/2019   Essential hypertension 03/26/2019   Type 2 diabetes mellitus without complication, without long-term current use of insulin (HCC) 03/26/2019   Aneurysm, cerebral, nonruptured 03/26/2019    THERAPY DIAG:  Chronic bilateral low back pain without sciatica  Cervicalgia  Muscle weakness (generalized)   Rationale for Evaluation and Treatment Rehabilitation  REFERRING DIAG: Cervicalgia   PERTINENT HISTORY: See above   PRECAUTIONS/RESTRICTIONS: None   SUBJECTIVE: Patient reports increased pain in his Rt shoulder that is radiating to his Rt fingertips, he  also describes chest pain on the Rt side and points to anterior shoulder/pec region. Does not endorse any SOB.   PAIN:  Are you having pain? Yes:  NPRS scale: 6/10 Pain location: Right neck/upper back, gluteal region Pain description: ache Aggravating factors: activity Relieving factors: pain meds , heat, massage   OBJECTIVE: (objective measures completed at initial evaluation unless otherwise dated) PATIENT SURVEYS:  Modified Oswestry 30/50 (60% perceived disability)   08/25/2022: 22/50 (44%)  NDI: 20/50 (40%) - 08/25/2022   MUSCLE LENGTH: Hamstrings: Right 60 deg; Left 60 deg Thomas test: PKB restricted B   POSTURE: rounded shoulders and forward head   PALPATION: Deferred    LUMBAR ROM: Deferred due to time constraints   AROM eval  Flexion    Extension    Right lateral flexion    Left lateral flexion    Right rotation    Left rotation     (Blank rows = not tested)   LOWER EXTREMITY ROM:  WFL for gait and transfers   Active  Right eval Left eval  Hip flexion      Hip extension      Hip abduction      Hip adduction      Hip internal rotation      Hip external rotation      Knee flexion      Knee extension      Ankle dorsiflexion  Ankle plantarflexion      Ankle inversion      Ankle eversion       (Blank rows = not tested)   LOWER EXTREMITY MMT:     MMT Right eval Left eval Right 08/18/22 Left 08/18/22 Rt / Lt 08/25/2022  Hip flexion 4 4 4+ p! 4+ 4+ / 4+  Hip extension         Hip abduction 4 4 4+ 4+ 4+ / 4+  Hip adduction         Hip internal rotation         Hip external rotation         Knee flexion 4 4 4 4  4+ / 4+  Knee extension 4 4 4+ 4+ 4+ / 4+  Ankle dorsiflexion         Ankle plantarflexion 4 4     Ankle inversion         Ankle eversion          (Blank rows = not tested)   LUMBAR SPECIAL TESTS:  Straight leg raise test: Negative, Slump test: Positive, and FABER test: Negative positive R slump   FUNCTIONAL TESTS:  5 times sit to  stand: 25s arms crossed   CERVICAL ROM:    Active ROM A/PROM (deg) eval   08/25/2022  Flexion 75% 60  Extension 75% 30  Right lateral flexion 50% 20  Left lateral flexion 25% 10  Right rotation 50%P! 60  Left rotation 90% 45   (Blank rows = not tested)   UPPER EXTREMITY ROM:   Active ROM Right eval Left eval Rt / Lt 08/25/2022  Shoulder flexion 135d 90d 140 / 110  Shoulder extension WNL WNL   Shoulder abduction 150d 100d   Shoulder adduction       Shoulder extension       Shoulder internal rotation       Shoulder external rotation       Elbow flexion       Elbow extension       Wrist flexion       Wrist extension       Wrist ulnar deviation       Wrist radial deviation       Wrist pronation       Wrist supination        (Blank rows = not tested)    GAIT: Distance walked: 41ft x2 Assistive device utilized: None Level of assistance: Complete Independence Comments: slow cadence antalgic gait     TODAY'S TREATMENT [facility] Adult PT Treatment:                                                DATE: 09/21/22 Therapeutic Exercise: NuStep L5 x 8 min with LE while taking subjective and planning session with patient Piriformis stretch 2 x 30 sec each LTR x 10 Supine sciatic nerve glide x20 BIL Doorway pec stretch at 60 deg 3 x 20 sec Row with blue 2x10 Seated horizontal abduction RTB x10 (small range) Manual: STM to Rt rhomboid, middle/lower traps, Rt pec insertion Self Care: Theracane self instruction   [facility] Adult PT Treatment:  DATE: 09/14/2022 Therapeutic Exercise: NuStep L7 x 5 min with LE while taking subjective and planning session with patient Piriformis stretch 2 x 30 sec each LTR x 10 Bridge x 10 Sidelying thoracic rotation x 10 each Clamshell x 20 Doorway pec stretch at 60 deg 3 x 20 sec Row with blue x 20 Manual: Skilled palpation and monitoring of muscle tension while performing TPDN TPR for right posterior  shoulder and gluteal region Trigger Point Dry Needling Treatment: Pre-treatment instruction: Patient instructed on dry needling rationale, procedures, and possible side effects including pain during treatment (achy,cramping feeling), bruising, drop of blood, lightheadedness, nausea, sweating. Patient Consent Given: Yes Education handout provided: Previously provided Muscles treated: Right teres major/minor, lat, rhomboid, lumbar multifidi and piriformis Needle size and number: .30x25mm x 6, .30x2mm x 3 Electrical stimulation performed: No Parameters: N/A Treatment response/outcome: Twitch response elicited and Palpable decrease in muscle tension Post-treatment instructions: Patient instructed to expect possible mild to moderate muscle soreness later today and/or tomorrow. Patient instructed in methods to reduce muscle soreness and to continue prescribed HEP. If patient was dry needled over the lung field, patient was instructed on signs and symptoms of pneumothorax and, however unlikely, to see immediate medical attention should they occur. Patient was also educated on signs and symptoms of infection and to seek medical attention should they occur. Patient verbalized understanding of these instructions and education.   PATIENT EDUCATION:  Education details: TPDN, HEP update Person educated: Patient Education method: Explanation, Handout Education comprehension: verbalized understanding and needs further education   HOME EXERCISE PROGRAM: Access Code: [email or web address]   ASSESSMENT: CLINICAL IMPRESSION:  Patient presents to PT reporting increased pain in his Rt shoulder that he describes as electricity radiating to his fingertips and also describes chest pain and points to anterior shoulder and pec region. He does not endorse and SOB. Session today focused on gentle periscapular and proximal hip strengthening and stretching. Utilized manual techniques to decrease tension and tissue restriction with  patient reporting therapeutic benefit. Patient continues to benefit from skilled PT services and should be progressed as able to improve functional independence.    OBJECTIVE IMPAIRMENTS: Abnormal gait, decreased activity tolerance, decreased endurance, decreased mobility, difficulty walking, decreased ROM, decreased strength, impaired flexibility, impaired UE functional use, improper body mechanics, postural dysfunction, and pain.    ACTIVITY LIMITATIONS: carrying, lifting, bending, sitting, standing, squatting, sleeping, and stairs   PERSONAL FACTORS: Age, Fitness, Past/current experiences, Time since onset of injury/illness/exacerbation, and B RC repairs  are also affecting patient's functional outcome.      GOALS: Goals reviewed with patient? No   SHORT TERM GOALS: Target date: 07/27/2022   Patient to demonstrate independence in HEP  Baseline: [email or web address] Goal status: MET   2.  Assess trunk mobility Baseline: TBD 4/10: limited all planes Goal status: MET   LONG TERM GOALS: Target date: 10/06/2022   Decrease ODI score to 22/50 Baseline: 30/50 08/25/2022: 22/50 Goal status: MET   2.  Increase BLE strength to 4+/5 Baseline:  MMT Right eval Left eval  Hip flexion 4 4  Hip extension      Hip abduction 4 4  Hip adduction      Hip internal rotation      Hip external rotation      Knee flexion 4 4  Knee extension 4 4  Ankle dorsiflexion      Ankle plantarflexion 4 4     08/25/2022: grossly 4+/5 MMT Goal status: MET   3.  Increase cervical ROM to  75% globally Baseline:  Active ROM A/PROM (deg) eval  Flexion 75%  Extension 75%  Right lateral flexion 50%  Left lateral flexion 25%  Right rotation 50%P!  Left rotation 90%   08/25/2022: patient continues to demonstrate limitations noted above Goal status: NOT MET   4.  Increase B shoulder flexion and abduction to 160d Baseline:  Active ROM Right eval Left eval  Shoulder flexion 135d 90d  Shoulder extension WNL WNL   Shoulder abduction 150d 100d   08/25/2022: patient continues to demonstrate limitations noted above Goal status: NOT MET  5. Decrease NDI score to 15/50 Baseline: 20/50 Goal status: INITIAL       PLAN: PT FREQUENCY: 1x/week   PT DURATION: 6 weeks   PLANNED INTERVENTIONS: Therapeutic exercises, Therapeutic activity, Neuromuscular re-education, Balance training, Gait training, Patient/Family education, Self Care, Joint mobilization, Dry Needling, Manual therapy, and Re-evaluation.   PLAN FOR NEXT SESSION: HEP review and update, ROM and flexibility tasks for all affected regions, posture exercises, core and LE strengthening   Berta Minor PTA 09/21/22  10:00 AM Phone: [phone] Fax: [phone]

## 2022-09-21 ENCOUNTER — Ambulatory Visit: Payer: Medicaid Other | Attending: Physician Assistant

## 2022-09-21 DIAGNOSIS — M25512 Pain in left shoulder: Secondary | ICD-10-CM | POA: Insufficient documentation

## 2022-09-21 DIAGNOSIS — M542 Cervicalgia: Secondary | ICD-10-CM | POA: Diagnosis present

## 2022-09-21 DIAGNOSIS — G8929 Other chronic pain: Secondary | ICD-10-CM | POA: Insufficient documentation

## 2022-09-21 DIAGNOSIS — M6281 Muscle weakness (generalized): Secondary | ICD-10-CM | POA: Insufficient documentation

## 2022-09-21 DIAGNOSIS — M545 Low back pain, unspecified: Secondary | ICD-10-CM | POA: Insufficient documentation

## 2022-09-26 DIAGNOSIS — E119 Type 2 diabetes mellitus without complications: Secondary | ICD-10-CM | POA: Diagnosis not present

## 2022-09-27 DIAGNOSIS — E119 Type 2 diabetes mellitus without complications: Secondary | ICD-10-CM | POA: Diagnosis not present

## 2022-09-27 DIAGNOSIS — Z6826 Body mass index (BMI) 26.0-26.9, adult: Secondary | ICD-10-CM | POA: Diagnosis not present

## 2022-09-27 DIAGNOSIS — M545 Low back pain, unspecified: Secondary | ICD-10-CM | POA: Diagnosis not present

## 2022-09-27 DIAGNOSIS — M25551 Pain in right hip: Secondary | ICD-10-CM | POA: Diagnosis not present

## 2022-09-27 DIAGNOSIS — R5383 Other fatigue: Secondary | ICD-10-CM | POA: Diagnosis not present

## 2022-09-27 DIAGNOSIS — Z9181 History of falling: Secondary | ICD-10-CM | POA: Diagnosis not present

## 2022-09-27 DIAGNOSIS — Z79899 Other long term (current) drug therapy: Secondary | ICD-10-CM | POA: Diagnosis not present

## 2022-09-27 DIAGNOSIS — M5430 Sciatica, unspecified side: Secondary | ICD-10-CM | POA: Diagnosis not present

## 2022-09-27 DIAGNOSIS — M25511 Pain in right shoulder: Secondary | ICD-10-CM | POA: Diagnosis not present

## 2022-09-27 DIAGNOSIS — R030 Elevated blood-pressure reading, without diagnosis of hypertension: Secondary | ICD-10-CM | POA: Diagnosis not present

## 2022-09-27 DIAGNOSIS — E559 Vitamin D deficiency, unspecified: Secondary | ICD-10-CM | POA: Diagnosis not present

## 2022-09-28 ENCOUNTER — Other Ambulatory Visit: Payer: Self-pay

## 2022-09-28 ENCOUNTER — Encounter: Payer: Self-pay | Admitting: Physical Therapy

## 2022-09-28 ENCOUNTER — Ambulatory Visit: Payer: Medicaid Other | Admitting: Physical Therapy

## 2022-09-28 DIAGNOSIS — M25512 Pain in left shoulder: Secondary | ICD-10-CM

## 2022-09-28 DIAGNOSIS — M6281 Muscle weakness (generalized): Secondary | ICD-10-CM

## 2022-09-28 DIAGNOSIS — M542 Cervicalgia: Secondary | ICD-10-CM

## 2022-09-28 DIAGNOSIS — M545 Low back pain, unspecified: Secondary | ICD-10-CM

## 2022-09-28 NOTE — Therapy (Signed)
OUTPATIENT PHYSICAL THERAPY TREATMENT NOTE  DISCHARGE   Patient Name: Curtis Williamson MRN: [id number] DOB:[birth year], 62 y.o., male Today's Date: 09/28/2022   PCP: Georganna Skeans, MD  REFERRING PROVIDER: Jamey Reas, PA-C    PT End of Session - 09/28/22 [phone]     Visit Number 12    Number of Visits 14    Date for PT Re-Evaluation 10/06/22    Authorization Type [employer] MCD    Authorization - Number of Visits 27    PT Start Time 0802    PT Stop Time 0845    PT Time Calculation (min) 43 min    Activity Tolerance Patient tolerated treatment well    Behavior During Therapy WFL for tasks assessed/performed              Past Medical History:  Diagnosis Date   Chronic kidney disease    only has one kidney   Diabetes mellitus without complication (HCC)    Essential hypertension    Hyperlipidemia    Hypertension    Phreesia 01/13/2020   Past Surgical History:  Procedure Laterality Date   KIDNEY DONATION     MANDIBLE FRACTURE SURGERY     ROTATOR CUFF REPAIR Right    Patient Active Problem List   Diagnosis Date Noted   Lumbosacral spondylosis without myelopathy 12/13/2021   Low back pain 10/28/2020   Pain due to onychomycosis of toenails of both feet 10/09/2020   Hyperlipidemia 09/22/2020   Thickened nails 09/22/2020   Insomnia 09/22/2020   Acute pain of left shoulder 05/19/2020   Tobacco dependence 03/26/2019   Essential hypertension 03/26/2019   Type 2 diabetes mellitus without complication, without long-term current use of insulin (HCC) 03/26/2019   Aneurysm, cerebral, nonruptured 03/26/2019    THERAPY DIAG:  Chronic bilateral low back pain without sciatica  Cervicalgia  Muscle weakness (generalized)  Acute pain of left shoulder   Rationale for Evaluation and Treatment Rehabilitation  REFERRING DIAG: Cervicalgia   PERTINENT HISTORY: See above   PRECAUTIONS/RESTRICTIONS: None   SUBJECTIVE: Patient reports continued pain but feels his  massages and exercises do help. He plans to start going to the gym and feels he has improved enough to be done with therapy.  PAIN:  Are you having pain? Yes:  NPRS scale: 5/10 Pain location: Right neck/upper back, gluteal region Pain description: ache Aggravating factors: activity Relieving factors: pain meds , heat, massage   OBJECTIVE: (objective measures completed at initial evaluation unless otherwise dated) PATIENT SURVEYS:  Modified Oswestry 30/50 (60% perceived disability)   08/25/2022: 22/50 (44%)  NDI: 20/50 (40%) - 08/25/2022   09/28/2022: 18/50   MUSCLE LENGTH: Hamstrings: Right 60 deg; Left 60 deg Thomas test: PKB restricted B   POSTURE: rounded shoulders and forward head   PALPATION: Deferred    LUMBAR ROM: Deferred due to time constraints   AROM eval  Flexion    Extension    Right lateral flexion    Left lateral flexion    Right rotation    Left rotation     (Blank rows = not tested)   LOWER EXTREMITY ROM:  WFL for gait and transfers   Active  Right eval Left eval  Hip flexion      Hip extension      Hip abduction      Hip adduction      Hip internal rotation      Hip external rotation      Knee flexion      Knee  extension      Ankle dorsiflexion      Ankle plantarflexion      Ankle inversion      Ankle eversion       (Blank rows = not tested)   LOWER EXTREMITY MMT:     MMT Right eval Left eval Right 08/18/22 Left 08/18/22 Rt / Lt 08/25/2022  Hip flexion 4 4 4+ p! 4+ 4+ / 4+  Hip extension         Hip abduction 4 4 4+ 4+ 4+ / 4+  Hip adduction         Hip internal rotation         Hip external rotation         Knee flexion 4 4 4 4  4+ / 4+  Knee extension 4 4 4+ 4+ 4+ / 4+  Ankle dorsiflexion         Ankle plantarflexion 4 4     Ankle inversion         Ankle eversion          (Blank rows = not tested)   LUMBAR SPECIAL TESTS:  Straight leg raise test: Negative, Slump test: Positive, and FABER test: Negative positive R slump    FUNCTIONAL TESTS:  5 times sit to stand: 25s arms crossed   CERVICAL ROM:    Active ROM A/PROM (deg) eval   08/25/2022   09/28/2022  Flexion 75% 60 60  Extension 75% 30 30  Right lateral flexion 50% 20 20  Left lateral flexion 25% 10 15  Right rotation 50%P! 60 60  Left rotation 90% 45 55   (Blank rows = not tested)   UPPER EXTREMITY ROM:   Active ROM Right eval Left eval Rt / Lt 08/25/2022 Rt / Lt 09/28/2022  Shoulder flexion 135d 90d 140 / 110 150 / 150  Shoulder extension WNL WNL    Shoulder abduction 150d 100d    Shoulder adduction        Shoulder extension        Shoulder internal rotation        Shoulder external rotation        Elbow flexion        Elbow extension        Wrist flexion        Wrist extension        Wrist ulnar deviation        Wrist radial deviation        Wrist pronation        Wrist supination         (Blank rows = not tested)    GAIT: Distance walked: 50ft x2 Assistive device utilized: None Level of assistance: Complete Independence Comments: slow cadence antalgic gait     TODAY'S TREATMENT [facility] Adult PT Treatment:                                                DATE: 09/28/2022 Therapeutic Exercise: NuStep L7 x 5 min with UE/LE while taking subjective and planning session with patient Piriformis stretch 3 x 30 sec each LTR 5 x 5 sec Bridge x 10 Sidelying thoracic rotation x 10 each Supine horizontal abduction with green x 15 Doorway pec stretch at 60 deg 2 x 30 sec Row machine 35# x 10 Manual: Skilled palpation and monitoring of muscle tension  while performing TPDN TPR for right posterior shoulder and gluteal region Trigger Point Dry Needling Treatment: Pre-treatment instruction: Patient instructed on dry needling rationale, procedures, and possible side effects including pain during treatment (achy,cramping feeling), bruising, drop of blood, lightheadedness, nausea, sweating. Patient Consent Given: Yes Education handout  provided: Previously provided Muscles treated: Right teres major/minor, lat, infraspinatus, lumbar multifidi and piriformis/glute, bilateral upper trap, right QL, right middle and posterior deltoid Needle size and number: .30x10mm x 7, .30x68mm x 4 Electrical stimulation performed: No Parameters: N/A Treatment response/outcome: Twitch response elicited and Palpable decrease in muscle tension Post-treatment instructions: Patient instructed to expect possible mild to moderate muscle soreness later today and/or tomorrow. Patient instructed in methods to reduce muscle soreness and to continue prescribed HEP. If patient was dry needled over the lung field, patient was instructed on signs and symptoms of pneumothorax and, however unlikely, to see immediate medical attention should they occur. Patient was also educated on signs and symptoms of infection and to seek medical attention should they occur. Patient verbalized understanding of these instructions and education.   [facility] Adult PT Treatment:                                                DATE: 09/21/22 Therapeutic Exercise: NuStep L7 x 5 min with UE/LE while taking subjective and planning session with patient Piriformis stretch 2 x 30 sec each LTR x 10 Supine sciatic nerve glide x20 BIL Doorway pec stretch at 60 deg 3 x 20 sec Row with blue 2x10 Seated horizontal abduction RTB x10 (small range) Manual: STM to Rt rhomboid, middle/lower traps, Rt pec insertion Self Care: Theracane self instruction  [facility] Adult PT Treatment:                                                DATE: 09/14/2022 Therapeutic Exercise: NuStep L7 x 5 min with LE while taking subjective and planning session with patient Piriformis stretch 2 x 30 sec each LTR x 10 Bridge x 10 Sidelying thoracic rotation x 10 each Clamshell x 20 Doorway pec stretch at 60 deg 3 x 20 sec Row with blue x 20 Manual: Skilled palpation and monitoring of muscle tension while performing TPDN TPR  for right posterior shoulder and gluteal region Trigger Point Dry Needling Treatment: Pre-treatment instruction: Patient instructed on dry needling rationale, procedures, and possible side effects including pain during treatment (achy,cramping feeling), bruising, drop of blood, lightheadedness, nausea, sweating. Patient Consent Given: Yes Education handout provided: Previously provided Muscles treated: Right teres major/minor, lat, rhomboid, lumbar multifidi and piriformis Needle size and number: .30x24mm x 6, .30x65mm x 3 Electrical stimulation performed: No Parameters: N/A Treatment response/outcome: Twitch response elicited and Palpable decrease in muscle tension Post-treatment instructions: Patient instructed to expect possible mild to moderate muscle soreness later today and/or tomorrow. Patient instructed in methods to reduce muscle soreness and to continue prescribed HEP. If patient was dry needled over the lung field, patient was instructed on signs and symptoms of pneumothorax and, however unlikely, to see immediate medical attention should they occur. Patient was also educated on signs and symptoms of infection and to seek medical attention should they occur. Patient verbalized understanding of these instructions and education.  PATIENT EDUCATION:  Education details: POC discharge, TPDN, HEP Person educated: Patient Education method: Explanation Education comprehension: Verbalized understanding   HOME EXERCISE PROGRAM: Access Code: [email or web address]   ASSESSMENT: CLINICAL IMPRESSION:  Patient tolerated therapy well with no adverse effects. Continued with TPDN for the neck, right posterior shoulder, lumbar and piriformis region with good therapeutic benefit. He demonstrates improvement in his neck and bilateral shoulder motion and does report improvement in his functional level. He does continue to exhibit limitations in his neck and shoulder motion and limitations in functional ability but  feels he has improved enough and is ready to transition to independent HEP and gym program. Patient will be discharged from PT this visit.   OBJECTIVE IMPAIRMENTS: Abnormal gait, decreased activity tolerance, decreased endurance, decreased mobility, difficulty walking, decreased ROM, decreased strength, impaired flexibility, impaired UE functional use, improper body mechanics, postural dysfunction, and pain.    ACTIVITY LIMITATIONS: carrying, lifting, bending, sitting, standing, squatting, sleeping, and stairs   PERSONAL FACTORS: Age, Fitness, Past/current experiences, Time since onset of injury/illness/exacerbation, and B RC repairs  are also affecting patient's functional outcome.      GOALS: Goals reviewed with patient? No   SHORT TERM GOALS: Target date: 10/06/2022   Patient to demonstrate independence in HEP  Baseline: [email or web address] Goal status: MET   2.  Assess trunk mobility Baseline: TBD 4/10: limited all planes Goal status: MET   LONG TERM GOALS: Target date: 10/06/2022   Decrease ODI score to 22/50 Baseline: 30/50 08/25/2022: 22/50 Goal status: MET   2.  Increase BLE strength to 4+/5 Baseline:  MMT Right eval Left eval  Hip flexion 4 4  Hip extension      Hip abduction 4 4  Hip adduction      Hip internal rotation      Hip external rotation      Knee flexion 4 4  Knee extension 4 4  Ankle dorsiflexion      Ankle plantarflexion 4 4     08/25/2022: grossly 4+/5 MMT Goal status: MET   3.  Increase cervical ROM to 75% globally Baseline:  Active ROM A/PROM (deg) eval  Flexion 75%  Extension 75%  Right lateral flexion 50%  Left lateral flexion 25%  Right rotation 50%P!  Left rotation 90%   08/25/2022: patient continues to demonstrate limitations noted above 09/28/2022: see limitations above Goal status: PARTIALLY MET   4.  Increase B shoulder flexion and abduction to 160d Baseline:  Active ROM Right eval Left eval  Shoulder flexion 135d 90d  Shoulder  extension WNL WNL  Shoulder abduction 150d 100d   08/25/2022: patient continues to demonstrate limitations noted above 09/28/2022:see limitations above Goal status: PARTIALLY MET  5. Decrease NDI score to 15/50 Baseline: 20/50 09/28/2022: 18/50 Goal status: PARTIALLY MET       PLAN: PT FREQUENCY: 1x/week   PT DURATION: 6 weeks   PLANNED INTERVENTIONS: Therapeutic exercises, Therapeutic activity, Neuromuscular re-education, Balance training, Gait training, Patient/Family education, Self Care, Joint mobilization, Dry Needling, Manual therapy, and Re-evaluation.   PLAN FOR NEXT SESSION: NA - discharge   Hilbert Bible PT 09/28/22  9:00 AM Phone: [phone] Fax: [phone]    PHYSICAL THERAPY DISCHARGE SUMMARY  Visits from Start of Care: 12  Current functional level related to goals / functional outcomes: See above   Remaining deficits: See above   Education / Equipment: HEP   Patient agrees to discharge. Patient goals were partially met. Patient is being discharged due to being pleased with the  current functional level.

## 2022-09-28 NOTE — Patient Instructions (Signed)
Access Code: [facility] URL: [email or web address] Date: 09/28/2022 Prepared by: Rosana Hoes  Exercises - Supine Piriformis Stretch with Foot on Ground  - 1 x daily - 5 x weekly - 3 reps - 30 seconds hold - Supine Lower Trunk Rotation  - 1 x daily - 5 x weekly - 10 reps - Bridge  - 1 x daily - 3 x weekly - 3 sets - 10 reps - Clamshell  - 1 x daily - 3 x weekly - 3 sets - 20 reps - Sidelying Thoracic Lumbar Rotation  - 1 x daily - 5 x weekly - 10 reps - Seated Cervical Sidebending Stretch  - 1 x daily - 5 x weekly - 3 reps - 30 seconds hold - Seated Levator Scapulae Stretch  - 1 x daily - 5 x weekly - 3 reps - 30 seconds hold - Doorway Pec Stretch at 60 Elevation  - 1 x daily - 5 x weekly - 3 reps - 30 seconds hold - Standing Row with Anchored Resistance  - 1 x daily - 3 x weekly - 3 sets - 20 reps - Step Back Shoulder Stretch with Chair  - 1 x daily - 5 x weekly - 10 reps - 5 seconds hold - Sit to Stand Without Arm Support  - 1 x daily - 3 x weekly - 3 sets - 10 reps

## 2022-10-03 ENCOUNTER — Encounter (HOSPITAL_COMMUNITY): Payer: Self-pay

## 2022-10-03 ENCOUNTER — Emergency Department (HOSPITAL_COMMUNITY): Payer: Medicaid Other

## 2022-10-03 ENCOUNTER — Other Ambulatory Visit: Payer: Self-pay

## 2022-10-03 ENCOUNTER — Emergency Department (HOSPITAL_COMMUNITY)
Admission: EM | Admit: 2022-10-03 | Discharge: 2022-10-03 | Disposition: A | Discharge status: Home / Self Care | Payer: Medicaid Other | Attending: Emergency Medicine | Admitting: Emergency Medicine

## 2022-10-03 DIAGNOSIS — I10 Essential (primary) hypertension: Secondary | ICD-10-CM | POA: Diagnosis not present

## 2022-10-03 DIAGNOSIS — Z79899 Other long term (current) drug therapy: Secondary | ICD-10-CM | POA: Insufficient documentation

## 2022-10-03 DIAGNOSIS — R519 Headache, unspecified: Secondary | ICD-10-CM | POA: Diagnosis present

## 2022-10-03 DIAGNOSIS — Z7984 Long term (current) use of oral hypoglycemic drugs: Secondary | ICD-10-CM | POA: Insufficient documentation

## 2022-10-03 DIAGNOSIS — E119 Type 2 diabetes mellitus without complications: Secondary | ICD-10-CM | POA: Insufficient documentation

## 2022-10-03 DIAGNOSIS — F17210 Nicotine dependence, cigarettes, uncomplicated: Secondary | ICD-10-CM | POA: Insufficient documentation

## 2022-10-03 HISTORY — DX: Cerebral aneurysm, nonruptured: I67.1

## 2022-10-03 LAB — CBC WITH DIFFERENTIAL/PLATELET
Abs Immature Granulocytes: 0.05 10*3/uL (ref 0.00–0.07)
Basophils Absolute: 0.1 10*3/uL (ref 0.0–0.1)
Basophils Relative: 1 %
Eosinophils Absolute: 0.6 10*3/uL — ABNORMAL HIGH (ref 0.0–0.5)
Eosinophils Relative: 5 %
HCT: 44.2 % (ref 39.0–52.0)
Hemoglobin: 14.1 g/dL (ref 13.0–17.0)
Immature Granulocytes: 0 %
Lymphocytes Relative: 28 %
Lymphs Abs: 3.1 10*3/uL (ref 0.7–4.0)
MCH: 29.4 pg (ref 26.0–34.0)
MCHC: 31.9 g/dL (ref 30.0–36.0)
MCV: 92.3 fL (ref 80.0–100.0)
Monocytes Absolute: 0.9 10*3/uL (ref 0.1–1.0)
Monocytes Relative: 8 %
Neutro Abs: 6.6 10*3/uL (ref 1.7–7.7)
Neutrophils Relative %: 58 %
Platelets: 373 10*3/uL (ref 150–400)
RBC: 4.79 MIL/uL (ref 4.22–5.81)
RDW: 16.3 % — ABNORMAL HIGH (ref 11.5–15.5)
WBC: 11.4 10*3/uL — ABNORMAL HIGH (ref 4.0–10.5)
nRBC: 0 % (ref 0.0–0.2)

## 2022-10-03 LAB — BASIC METABOLIC PANEL
Anion gap: 16 — ABNORMAL HIGH (ref 5–15)
BUN: 12 mg/dL (ref 8–23)
CO2: 25 mmol/L (ref 22–32)
Calcium: 8.9 mg/dL (ref 8.9–10.3)
Chloride: 102 mmol/L (ref 98–111)
Creatinine, Ser: 1.23 mg/dL (ref 0.61–1.24)
GFR, Estimated: >60 mL/min (ref >60)
Glucose, Bld: 204 mg/dL — ABNORMAL HIGH (ref 70–99)
Potassium: 3.5 mmol/L (ref 3.5–5.1)
Sodium: 143 mmol/L (ref 135–145)

## 2022-10-03 LAB — TROPONIN I (HIGH SENSITIVITY): Troponin I (High Sensitivity): 5 ng/L (ref <18)

## 2022-10-03 MED ORDER — IPRATROPIUM-ALBUTEROL 0.5-2.5 (3) MG/3ML IN SOLN
3.0000 mL | Freq: Once | RESPIRATORY_TRACT | Status: AC
  Administered 2022-10-03: 3 mL via RESPIRATORY_TRACT
  Filled 2022-10-03: qty 3

## 2022-10-03 MED ORDER — ONDANSETRON 4 MG PO TBDP: 4.0000 mg | ORAL_TABLET | Freq: Three times a day (TID) | ORAL | 0 refills | Status: DC | PRN

## 2022-10-03 MED ORDER — IOHEXOL 350 MG/ML SOLN
75.0000 mL | Freq: Once | INTRAVENOUS | Status: AC | PRN
  Administered 2022-10-03: 75 mL via INTRAVENOUS

## 2022-10-03 MED ORDER — METOCLOPRAMIDE HCL 5 MG/ML IJ SOLN
10.0000 mg | INTRAMUSCULAR | Status: AC
  Administered 2022-10-03: 10 mg via INTRAVENOUS
  Filled 2022-10-03: qty 2

## 2022-10-03 MED ORDER — DIPHENHYDRAMINE HCL 50 MG/ML IJ SOLN
25.0000 mg | Freq: Once | INTRAMUSCULAR | Status: AC
  Administered 2022-10-03: 25 mg via INTRAVENOUS
  Filled 2022-10-03: qty 1

## 2022-10-03 MED ORDER — ALBUTEROL SULFATE HFA 108 (90 BASE) MCG/ACT IN AERS: 1.0000 | INHALATION_SPRAY | Freq: Four times a day (QID) | RESPIRATORY_TRACT | 0 refills | Status: DC | PRN

## 2022-10-03 NOTE — Discharge Instructions (Addendum)
Can use albuterol if needed for wheezing. Please follow-up with your primary care doctor and neurology. Return here for new concerns.

## 2022-10-03 NOTE — ED Provider Notes (Signed)
Patient's care resumed at shift change from Sharilyn Sites, PA-C.  For full HPI, please refer to previous providers note.  Plan is to reassess after CT angio head and neck.  CT angio head and neck on 06/02/2022 showed 1. No acute intracranial abnormality. 2. 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 The previously questioned outpouching arising from the left ACA near the A1-A2 junction is not visualized.  Today 1. Negative for intracranial aneurysm. Stable small distal Left ICA infundibulum (normal variant). 2. Generalized arterial tortuosity but no atherosclerosis or stenosis in the head or neck. Incidentally both vertebral arteries arise directly from the arch (normal variant) including an unusual direct right vertebral origin off the distal arch. 3. Stable and normal for age CT appearance of the brain.   Physical Exam  BP 134/80 (BP Location: Right Arm)   Pulse (!) 104   Temp 97.9 F (36.6 C)   Resp 20   Ht 5\' 11"  (1.803 m)   Wt 83.9 kg   SpO2 94%   BMI 25.80 kg/m   Physical Exam Vitals and nursing note reviewed.  Constitutional:      Appearance: Normal appearance.  HENT:     Head: Normocephalic and atraumatic.     Mouth/Throat:     Mouth: Mucous membranes are moist.  Eyes:     General: No scleral icterus. Cardiovascular:     Rate and Rhythm: Normal rate and regular rhythm.     Pulses: Normal pulses.     Heart sounds: Normal heart sounds.  Pulmonary:     Effort: Pulmonary effort is normal.     Breath sounds: Normal breath sounds.  Abdominal:     General: Abdomen is flat.     Palpations: Abdomen is soft.     Tenderness: There is no abdominal tenderness.  Musculoskeletal:        General: No deformity.  Skin:    General: Skin is warm.     Findings: No rash.  Neurological:     General: No focal deficit present.     Mental Status: He is alert.     Comments:  Cranial nerves II through XII intact. Intact sensation to light touch in all 4 extremities. 5/5 strength in all 4 extremities. Intact finger-to-nose and heel-to-shin of all 4 extremities. No visual field cuts. No neglect noted. No aphasia noted.   Psychiatric:        Mood and Affect: Mood normal.     Procedures  Procedures  ED Course / MDM    Medical Decision Making Amount and/or Complexity of Data Reviewed Labs: ordered. Radiology: ordered. ECG/medicine tests: ordered.  Risk Prescription drug management.   62 year old male presents to the ER with chief complaint of left-sided headache that woke him from sleep this morning.  He has aneurysm that is currently being monitored.  CT angio head and neck today show a small stable distal left ICA infundibulum.  Negative for intracranial aneurysm.  CBC with leukocytosis of 11.4.  Chest x-ray clear.  He is awake, alert, oriented.  No focal neurological deficit.  No meningeal sign.  Given Reglan and Benadryl and DuoNeb.  Reevaluation of patient's after these medication showed that the patient improved.  Patient was given food, drink and was able to tolerate p.o. here.  He is stable for discharge.  Advised patient to take Tylenol/ibuprofen or oxycodone as needed for headache, follow-up with neurology and primary care physician for further evaluation management.  Strict return precaution discussed.  Disposition Continued outpatient therapy. Follow-up with PCP and neurology recommended for reevaluation of symptoms. Treatment plan discussed with patient.  Pt acknowledged understanding was agreeable to the plan. Worrisome signs and symptoms were discussed with patient, and patient acknowledged understanding to return to the ED if they noticed these signs and symptoms. Patient was stable upon discharge.   This chart was dictated using voice recognition software.  Despite best efforts to proofread,  errors can occur which can change the documentation  meaning.       Jeanelle Malling, PA 10/03/22 1610    Marily Memos, MD 10/03/22 2330

## 2022-10-03 NOTE — ED Provider Notes (Signed)
[place] EMERGENCY DEPARTMENT AT [facility] Provider Note   CSN: [id number] Arrival date & time: 10/03/22  0440     History  Chief Complaint  Patient presents with   Headache    Curtis Williamson is a 62 y.o. male.  The history is provided by the patient and medical records.  Headache  62 year old male with history of hypertension, hyperlipidemia, diabetes, history of cerebral aneurysm, presenting to the ED with headache.  Patient reports headache woke him from sleep this morning, states it is severe, intense, and throbbing all throughout the left side of his head.  He also reports sensation of pressure behind the left eye and a little bit of blurred vision in the left eye as well.  He denies any focal numbness or weakness.  He is not having any difficulty walking or speaking.  States he has a known aneurysm on the left side of his brain that is currently being monitored and he is concerned about this.  He did try taking Motrin twice this morning without much relief.  Patient also complaining of chest tightness/wheezing.  This has been ongoing for few days now.  States he is actually been cutting back on his cigarette smoking.  He denies any fever or chills.  He has been using his daughter's nebulizer which has been providing him some relief.  Home Medications Prior to Admission medications   Medication Sig Start Date End Date Taking? Authorizing Provider  Accu-Chek Softclix Lancets lancets Use as instructed 09/22/20   Mayers, Cari S, PA-C  atorvastatin (LIPITOR) 10 MG tablet TAKE 1 TABLET (10 MG TOTAL) BY MOUTH DAILY. 01/18/21   Georganna Skeans, MD  Blood Glucose Monitoring Suppl (ACCU-CHEK GUIDE ME) w/Device KIT 1 each by Does not apply route 2 (two) times daily. Use to check FSBS twice a day. Dx: E11.9 09/22/20   Mayers, Cari S, PA-C  empagliflozin (JARDIANCE) 25 MG TABS tablet TAKE 1 TABLET BY MOUTH EVERY DAY BEFORE BREAKFAST 08/12/22   Georganna Skeans, MD  gabapentin  (NEURONTIN) 300 MG capsule Take 1 capsule (300 mg total) by mouth 3 (three) times daily. 10/01/21 10/01/22  Georganna Skeans, MD  glucose blood (ACCU-CHEK GUIDE) test strip Use as instructed 09/22/20   Mayers, Cari S, PA-C  hydrOXYzine (ATARAX/VISTARIL) 25 MG tablet Take 1 tablet (25 mg total) by mouth every 6 (six) hours. 11/19/20   Mayers, Cari S, PA-C  latanoprost (XALATAN) 0.005 % ophthalmic solution SMARTSIG:1 Drop(s) In Eye(s) Every Evening 08/28/21   [provider]  lisinopril (ZESTRIL) 10 MG tablet TAKE 1 TABLET BY MOUTH EVERY DAY 10/26/21   Georganna Skeans, MD  metFORMIN (GLUCOPHAGE) 500 MG tablet TAKE 1 TABLET BY MOUTH TWICE A DAY WITH FOOD 06/24/22   Georganna Skeans, MD  oxyCODONE-acetaminophen [facility]) 10-325 MG tablet  07/14/22   [provider]  traZODone (DESYREL) 50 MG tablet TAKE 0.5-1 TABLETS BY MOUTH AT BEDTIME AS NEEDED FOR SLEEP. 04/28/21   Georganna Skeans, MD  triamcinolone cream (KENALOG) 0.1 % Apply 1 application topically 2 (two) times daily. 07/11/19   [name], MD      Allergies    Mushroom extract complex    Review of Systems   Review of Systems  Respiratory:  Positive for shortness of breath and wheezing.   Neurological:  Positive for headaches.  All other systems reviewed and are negative.   Physical Exam Updated Vital Signs BP 134/80 (BP Location: Right Arm)   Pulse (!) 104   Temp 97.9 F (  36.6 C)   Resp 20   Ht 5\' 11"  (1.803 m)   Wt 83.9 kg   SpO2 94%   BMI 25.80 kg/m   Physical Exam Vitals and nursing note reviewed.  Constitutional:      Appearance: He is well-developed.  HENT:     Head: Normocephalic and atraumatic.  Eyes:     Conjunctiva/sclera: Conjunctivae normal.     Pupils: Pupils are equal, round, and reactive to light.  Neck:     Comments: No rigidity, normal ROM Cardiovascular:     Rate and Rhythm: Normal rate and regular rhythm.     Heart sounds: Normal heart sounds.  Pulmonary:     Effort: Pulmonary  effort is normal.     Breath sounds: Normal breath sounds. No wheezing.     Comments: No audible wheezing on my exam, does have smokers cough on exam Abdominal:     General: Bowel sounds are normal.     Palpations: Abdomen is soft.  Musculoskeletal:        General: Normal range of motion.     Cervical back: Normal range of motion.  Skin:    General: Skin is warm and dry.  Neurological:     Mental Status: He is alert and oriented to person, place, and time.     Comments: AAOx3, answering questions and following commands appropriately; equal strength UE and LE bilaterally; CN grossly intact; moves all extremities appropriately without ataxia; no focal neuro deficits or facial asymmetry appreciated, speech clear and goal oriented     ED Results / Procedures / Treatments   Labs (all labs ordered are listed, but only abnormal results are displayed) Labs Reviewed  CBC WITH DIFFERENTIAL/PLATELET - Abnormal; Notable for the following components:      Result Value   WBC 11.4 (*)    RDW 16.3 (*)    Eosinophils Absolute 0.6 (*)    All other components within normal limits  BASIC METABOLIC PANEL  TROPONIN I (HIGH SENSITIVITY)    EKG None  Radiology DG Chest 1 View  Result Date: 10/03/2022 CLINICAL DATA:  Wheezing and chest tightness. EXAM: CHEST  1 VIEW COMPARISON:  03/14/2020 FINDINGS: The lungs are clear without focal pneumonia, edema, pneumothorax or pleural effusion. Cardiopericardial silhouette is at upper limits of normal for size. Telemetry leads overlie the chest. No acute bony abnormality. IMPRESSION: No active cardiopulmonary disease. Electronically Signed   By: Kennith Center M.D.   On: 10/03/2022 06:09    Procedures Procedures    Medications Ordered in ED Medications  ipratropium-albuterol (DUONEB) 0.5-2.5 (3) MG/3ML nebulizer solution 3 mL (3 mLs Nebulization Given 10/03/22 0520)  metoCLOPramide (REGLAN) injection 10 mg (10 mg Intravenous Given 10/03/22 0530)   diphenhydrAMINE (BENADRYL) injection 25 mg (25 mg Intravenous Given 10/03/22 0529)    ED Course/ Medical Decision Making/ A&P                             Medical Decision Making Amount and/or Complexity of Data Reviewed Labs: ordered. Radiology: ordered and independent interpretation performed. ECG/medicine tests: ordered and independent interpretation performed.  Risk Prescription drug management.   62 year old male presenting to the ED with left-sided headache that woke him from sleep.  Has known aneurysm that is currently being monitored.  He is awake, alert, oriented.  He has no focal neurologic deficits.  No meningeal signs.  Also reports some wheezing but I do not appreciate any of this on  exam.  He does have a chronic sounding smoker's cough on exam.  Has been using his daughter's neb which has been providing good relief for him.  Given his history of aneurysm, will obtain CTA head/neck, check labs, CXR, EKG.  Migraine cocktail given.  Will reassess.  WBC count 11.4.  CXR clear.  Remainder of work-up pending at this time.  Care signed out to oncoming team-- will follow up on results and determine disposition.  If all results reassuring, anticipate discharge with PCP follow-up.  Final Clinical Impression(s) / ED Diagnoses Final diagnoses:  Bad headache    Rx / DC Orders ED Discharge Orders     None         Garlon Hatchet, PA-C 10/03/22 0630    Mesner, Barbara Cower, MD 10/03/22 0710

## 2022-10-03 NOTE — ED Triage Notes (Addendum)
Patient arrives POV c/o headache to left side of head. Patient states headache woke him up from his sleep. Patient has hx of DM2, stable unruptured cerebral aneurysm. Patient a&o x4.  Patient took 800mg  ibuprofen at 0400 this morning. Patient also reports wheezing.

## 2022-10-04 ENCOUNTER — Telehealth: Payer: Self-pay | Admitting: Licensed Clinical Social Worker

## 2022-10-04 NOTE — Transitions of Care (Post Inpatient/ED Visit) (Signed)
10/04/2022  Name: Curtis Williamson MRN: [id number] DOB: Mar 24, 1961  Patient reports symptoms are better as of today.   Today's TOC FU Call Status: Today's TOC FU Call Status:: Successful TOC FU Call Competed TOC FU Call Complete Date: 10/04/22  Transition Care Management Follow-up Telephone Call Date of Discharge: 10/03/22 Discharge Facility: [facility]) Type of Discharge: Emergency Department Reason for ED Visit: Other: How have you been since you were released from the hospital?: Better Any questions or concerns?: No  Items Reviewed: Did you receive and understand the discharge instructions provided?: Yes Medications obtained,verified, and reconciled?: Yes (Medications Reviewed) Any new allergies since your discharge?: No Dietary orders reviewed?: No Do you have support at home?: Yes People in Home: other relative(s) Name of Support/Comfort Primary Source: [name]  Medications Reviewed Today: Medications Reviewed Today     Reviewed by Curtis Williamson, PT (Physical Therapist) on 09/28/22 at [phone]  Med List Status: <None>   Medication Order Taking? Sig Documenting Provider Last Dose Status Informant  Accu-Chek Softclix Lancets lancets [id number] No Use as instructed Mayers, Cari S, PA-C Taking Active   atorvastatin (LIPITOR) 10 MG tablet [id number] No TAKE 1 TABLET (10 MG TOTAL) BY MOUTH DAILY. Georganna Skeans, MD Taking Active   Blood Glucose Monitoring Suppl (ACCU-CHEK GUIDE ME) w/Device KIT [id number] No 1 each by Does not apply route 2 (two) times daily. Use to check FSBS twice a day. Dx: E11.9 Mayers, Cari S, PA-C Taking Active   empagliflozin (JARDIANCE) 25 MG TABS tablet [id number]  TAKE 1 TABLET BY MOUTH EVERY DAY BEFORE Lenny Pastel, MD  Active   gabapentin (NEURONTIN) 300 MG capsule [id number] No Take 1 capsule (300 mg total) by mouth 3 (three) times daily. Georganna Skeans, MD Taking Active   glucose blood (ACCU-CHEK GUIDE) test strip [id number] No Use  as instructed Mayers, Cari S, PA-C Taking Active   hydrOXYzine (ATARAX/VISTARIL) 25 MG tablet [id number] No Take 1 tablet (25 mg total) by mouth every 6 (six) hours. Mayers, Cari S, PA-C Taking Active   latanoprost (XALATAN) 0.005 % ophthalmic solution [id number] No SMARTSIG:1 Drop(s) In Eye(s) Every Evening [provider] Taking Active   lisinopril (ZESTRIL) 10 MG tablet [id number] No TAKE 1 TABLET BY MOUTH EVERY DAY Georganna Skeans, MD Taking Active   metFORMIN (GLUCOPHAGE) 500 MG tablet [id number] No TAKE 1 TABLET BY MOUTH TWICE A DAY WITH FOOD Georganna Skeans, MD Taking Active   oxyCODONE-acetaminophen (PERCOCET) 10-325 MG tablet [id number]   [provider]  Active   traZODone (DESYREL) 50 MG tablet [id number] No TAKE 0.5-1 TABLETS BY MOUTH AT BEDTIME AS NEEDED FOR SLEEP. Georganna Skeans, MD Taking Active   triamcinolone cream (KENALOG) 0.1 % [id number] No Apply 1 application topically 2 (two) times daily. [name], MD Taking Active             Home Care and Equipment/Supplies: Were Home Health Services Ordered?: No Any new equipment or medical supplies ordered?: No  Functional Questionnaire: Do you need assistance with bathing/showering or dressing?: No Do you need assistance with meal preparation?: No Do you need assistance with eating?: No Do you have difficulty maintaining continence: No Do you need assistance with getting out of bed/getting out of a chair/moving?: No Do you have difficulty managing or taking your medications?: No  Follow up appointments reviewed: PCP Follow-up appointment confirmed?: Yes Date of PCP follow-up appointment?: 11/16/22 Follow-up Provider: PCP-DR [facility] Follow-up appointment confirmed?: No Do you need transportation to your follow-up appointment?:  No Do you understand care options if your condition(s) worsen?: Yes-patient verbalized understanding  Dickie La, BSW, MSW, [employer]  LCSW [place]  [facility] [email or web address] Phone: [phone]

## 2022-10-05 ENCOUNTER — Other Ambulatory Visit: Payer: Self-pay | Admitting: Family Medicine

## 2022-10-05 DIAGNOSIS — E119 Type 2 diabetes mellitus without complications: Secondary | ICD-10-CM

## 2022-10-05 NOTE — Telephone Encounter (Signed)
Requested medication (s) are due for refill today - unsure  Requested medication (s) are on the active medication list -yes  Future visit scheduled -yes  Last refill: 09/22/20  Notes to clinic: outside provider  Requested Prescriptions  Pending Prescriptions Disp Refills   Accu-Chek Softclix Lancets lancets 100 each 12    Sig: Use as instructed     Endocrinology: Diabetes - Testing Supplies Passed - 10/05/2022 10:06 AM      Passed - Valid encounter within last 12 months    Recent Outpatient Visits           1 month ago Type 2 diabetes mellitus with hyperglycemia, without long-term current use of insulin (HCC)   [place] [facility], Lauris Poag, [place]   9 months ago Type 2 diabetes mellitus with hyperglycemia, without long-term current use of insulin (HCC)   [place] [facility], Lauris Poag, [place]   1 year ago Type 2 diabetes mellitus with hyperglycemia, without long-term current use of insulin (HCC)   [place] [facility], Lauris Poag, MD   1 year ago Type 2 diabetes mellitus with hyperglycemia, without long-term current use of insulin (HCC)   [place] [facility], Kasandra Knudsen, [place]   1 year ago Hypoxia   [place] [facility], MD       Future Appointments             In 1 month Georganna Skeans, MD [facility]               Requested Prescriptions  Pending Prescriptions Disp Refills   Accu-Chek Softclix Lancets lancets 100 each 12    Sig: Use as instructed     Endocrinology: Diabetes - Testing Supplies Passed - 10/05/2022 10:06 AM      Passed - Valid encounter within last 12 months    Recent Outpatient Visits           1 month ago Type 2 diabetes mellitus with hyperglycemia, without long-term current use of insulin (HCC)   [place] [facility], Lauris Poag, [place]   9 months ago Type 2  diabetes mellitus with hyperglycemia, without long-term current use of insulin (HCC)   [place] [facility], Lauris Poag, MD   1 year ago Type 2 diabetes mellitus with hyperglycemia, without long-term current use of insulin (HCC)   [place] [facility], Lauris Poag, [place]   1 year ago Type 2 diabetes mellitus with hyperglycemia, without long-term current use of insulin [facility])   [place] [facility], Kasandra Knudsen, [place]   1 year ago Hypoxia   [place] [facility], MD       Future Appointments             In 1 month Georganna Skeans, MD [facility]

## 2022-10-05 NOTE — Telephone Encounter (Signed)
Medication Refill - Medication: Accu-Chek Softclix Lancets lancets   Has the patient contacted their pharmacy? Yes.   (Agent: If no, request that the patient contact the pharmacy for the refill. If patient does not wish to contact the pharmacy document the reason why and proceed with request.) (Agent: If yes, when and what did the pharmacy advise?) [facility] on [facility], call dr  Lorain Childes  PT completely out  Preferred Pharmacy (with phone number or street name):  [id number] [place] - [address] Phone: [phone]  Fax: [phone]     Has the patient been seen for an appointment in the last year OR does the patient have an upcoming appointment? Yes.    Agent: Please be advised that RX refills may take up to 3 business days. We ask that you follow-up with your pharmacy.  Pt states has no lancets!

## 2022-10-13 ENCOUNTER — Other Ambulatory Visit: Payer: Self-pay | Admitting: Family Medicine

## 2022-10-13 DIAGNOSIS — E119 Type 2 diabetes mellitus without complications: Secondary | ICD-10-CM

## 2022-10-13 MED ORDER — ACCU-CHEK SOFTCLIX LANCETS MISC: 12 refills | Status: AC

## 2022-11-08 ENCOUNTER — Telehealth: Payer: Self-pay | Admitting: Family Medicine

## 2022-11-08 NOTE — Telephone Encounter (Signed)
Copied from CRM [id number]. Topic: General - Other >> Nov 08, 2022 11:03 AM Franchot Heidelberg wrote: Reason for CRM: Heleen calling from [facility] called to see if fax was received from 10/20/2022 regarding diabetic supplies   Best contact: [phone]

## 2022-11-08 NOTE — Telephone Encounter (Signed)
Call place to [facility]:I spoke with Curtis Williamson and she said that paper has not been received form 10/26/2022 fax confirmation. Representative gave me another number to fax  [phone]

## 2022-11-16 ENCOUNTER — Ambulatory Visit: Payer: Medicaid Other | Admitting: Family Medicine

## 2022-11-25 ENCOUNTER — Telehealth: Payer: Self-pay | Admitting: Family Medicine

## 2022-11-25 NOTE — Telephone Encounter (Signed)
Copied from CRM [id number]. Topic: General - Other >> Nov 24, 2022 11:18 AM Phill Myron wrote: Alvy Beal from [facility]... is asking for the rx script for diabetic supplies to be refaxed to fax # [phone].. it was not received on July 23rd

## 2022-11-25 NOTE — Telephone Encounter (Signed)
Resent papers.

## 2022-11-28 ENCOUNTER — Ambulatory Visit
Admission: EM | Admit: 2022-11-28 | Discharge: 2022-11-28 | Disposition: A | Discharge status: Home / Self Care | Payer: Medicaid Other | Attending: Physician Assistant | Admitting: Physician Assistant

## 2022-11-28 DIAGNOSIS — Z20822 Contact with and (suspected) exposure to covid-19: Secondary | ICD-10-CM | POA: Diagnosis present

## 2022-11-28 NOTE — ED Triage Notes (Signed)
"  Exposure to COVID19". No symptoms. Requests testing.

## 2022-11-28 NOTE — ED Provider Notes (Signed)
[name]    CSN: [id number] Arrival date & time: 11/28/22  1047      History   Chief Complaint Chief Complaint  Patient presents with   COVID19 Testing    Exposure    HPI Srinivas Deroos is a 62 y.o. male.   Patient here today for evaluation of possible COVID exposure.  He would like screening for same.  He denies any symptoms but does have cough although he reports this is chronic.  He has not had any fever.  He denies any vomiting or diarrhea.  The history is provided by the patient.    Past Medical History:  Diagnosis Date   Cerebral aneurysm without rupture    Chronic kidney disease    only has one kidney   Diabetes mellitus without complication (HCC)    Essential hypertension    Hyperlipidemia    Hypertension    Phreesia 01/13/2020    Patient Active Problem List   Diagnosis Date Noted   Lumbosacral spondylosis without myelopathy 12/13/2021   Low back pain 10/28/2020   Pain due to onychomycosis of toenails of both feet 10/09/2020   Hyperlipidemia 09/22/2020   Thickened nails 09/22/2020   Insomnia 09/22/2020   Acute pain of left shoulder 05/19/2020   Tobacco dependence 03/26/2019   Essential hypertension 03/26/2019   Type 2 diabetes mellitus without complication, without long-term current use of insulin (HCC) 03/26/2019   Aneurysm, cerebral, nonruptured 03/26/2019    Past Surgical History:  Procedure Laterality Date   KIDNEY DONATION     MANDIBLE FRACTURE SURGERY     ROTATOR CUFF REPAIR Right        Home Medications    Prior to Admission medications   Medication Sig Start Date End Date Taking? Authorizing Provider  atorvastatin (LIPITOR) 10 MG tablet TAKE 1 TABLET (10 MG TOTAL) BY MOUTH DAILY. 01/18/21  Yes Georganna Skeans, MD  empagliflozin (JARDIANCE) 25 MG TABS tablet TAKE 1 TABLET BY MOUTH EVERY DAY BEFORE BREAKFAST 08/12/22  Yes Georganna Skeans, MD  lisinopril (ZESTRIL) 10 MG tablet TAKE 1 TABLET BY MOUTH EVERY DAY 10/05/22   Yes Georganna Skeans, MD  metFORMIN (GLUCOPHAGE) 500 MG tablet TAKE 1 TABLET BY MOUTH TWICE A DAY WITH FOOD 06/24/22  Yes Georganna Skeans, MD  oxyCODONE-acetaminophen [facility]) 10-325 MG tablet  07/14/22  Yes [provider]  traZODone (DESYREL) 50 MG tablet TAKE 0.5-1 TABLETS BY MOUTH AT BEDTIME AS NEEDED FOR SLEEP. 04/28/21  Yes Georganna Skeans, MD  triamcinolone cream (KENALOG) 0.1 % Apply 1 application topically 2 (two) times daily. 07/11/19  Yes [name], MD  Accu-Chek Softclix Lancets lancets Use as instructed 10/13/22   Georganna Skeans, MD  albuterol (VENTOLIN HFA) 108 (90 Base) MCG/ACT inhaler Inhale 1-2 puffs into the lungs every 6 (six) hours as needed for wheezing. 10/03/22   Garlon Hatchet, PA-C  Blood Glucose Monitoring Suppl (ACCU-CHEK GUIDE ME) w/Device KIT 1 each by Does not apply route 2 (two) times daily. Use to check FSBS twice a day. Dx: E11.9 09/22/20   Mayers, Cari S, PA-C  gabapentin (NEURONTIN) 300 MG capsule Take 1 capsule (300 mg total) by mouth 3 (three) times daily. 10/01/21 10/01/22  Georganna Skeans, MD  glucose blood (ACCU-CHEK GUIDE) test strip Use as instructed 09/22/20   Mayers, Cari S, PA-C  hydrOXYzine (ATARAX/VISTARIL) 25 MG tablet Take 1 tablet (25 mg total) by mouth every 6 (six) hours. 11/19/20   Mayers, Cari S, PA-C  latanoprost (XALATAN) 0.005 % ophthalmic solution  SMARTSIG:1 Drop(s) In Eye(s) Every Evening 08/28/21   [provider]  ondansetron (ZOFRAN-ODT) 4 MG disintegrating tablet Take 1 tablet (4 mg total) by mouth every 8 (eight) hours as needed for nausea or vomiting. 10/03/22   Jeanelle Malling, PA    Family History Family History  Problem Relation Age of Onset   Hypertension Mother    Kidney failure Mother    Kidney disease Mother    Heart disease Mother    ADD / ADHD Daughter    Anxiety disorder Daughter    Alcohol abuse Father    Colon cancer Neg Hx    Stomach cancer Neg Hx    Esophageal cancer Neg Hx    Colon polyps Neg Hx      Social History Social History   Tobacco Use   Smoking status: Every Day    Current packs/day: 0.33    Average packs/day: 0.3 packs/day for 30.0 years (9.9 ttl pk-yrs)    Types: Cigarettes   Smokeless tobacco: Never   Tobacco comments:    off and on, has tried to quit  Vaping Use   Vaping status: Never Used  Substance Use Topics   Alcohol use: Yes   Drug use: Never     Allergies   Mushroom extract complex   Review of Systems Review of Systems  Constitutional:  Negative for chills and fever.  HENT:  Negative for congestion and sore throat.   Eyes:  Negative for discharge and redness.  Respiratory:  Positive for cough. Negative for shortness of breath.   Gastrointestinal:  Negative for diarrhea and vomiting.     Physical Exam Triage Vital Signs ED Triage Vitals  Encounter Vitals Group     BP 11/28/22 1124 108/74     Systolic BP Percentile --      Diastolic BP Percentile --      Pulse Rate 11/28/22 1124 96     Resp 11/28/22 1124 18     Temp 11/28/22 1124 98.6 F (37 C)     Temp Source 11/28/22 1124 Oral     SpO2 11/28/22 1124 95 %     Weight 11/28/22 1122 184 lb (83.5 kg)     Height 11/28/22 1122 5\' 11"  (1.803 m)     Head Circumference --      Peak Flow --      Pain Score 11/28/22 1122 0     Pain Loc --      Pain Education --      Exclude from Growth Chart --    No data found.  Updated Vital Signs BP 108/74 (BP Location: Left Arm)   Pulse 96   Temp 98.6 F (37 C) (Oral)   Resp 18   Ht 5\' 11"  (1.803 m)   Wt 184 lb (83.5 kg)   SpO2 96%   BMI 25.66 kg/m      Physical Exam Vitals and nursing note reviewed.  Constitutional:      General: He is not in acute distress.    Appearance: Normal appearance. He is not ill-appearing.  HENT:     Head: Normocephalic and atraumatic.     Nose: Nose normal. No congestion or rhinorrhea.     Mouth/Throat:     Mouth: Mucous membranes are moist.     Pharynx: Oropharynx is clear. No oropharyngeal exudate or  posterior oropharyngeal erythema.  Eyes:     Conjunctiva/sclera: Conjunctivae normal.  Cardiovascular:     Rate and Rhythm: Normal rate and regular rhythm.  Pulmonary:     Effort: Pulmonary effort is normal. No respiratory distress.     Breath sounds: Rhonchi (mild, scattered) present. No wheezing or rales.  Neurological:     Mental Status: He is alert.  Psychiatric:        Mood and Affect: Mood normal.        Behavior: Behavior normal.        Thought Content: Thought content normal.      UC Treatments / Results  Labs (all labs ordered are listed, but only abnormal results are displayed) Labs Reviewed  SARS CORONAVIRUS 2 (TAT 6-24 HRS)    EKG   Radiology No results found.  Procedures Procedures (including critical care time)  Medications Ordered in UC Medications - No data to display  Initial Impression / Assessment and Plan / UC Course  I have reviewed the triage vital signs and the nursing notes.  Pertinent labs & imaging results that were available during my care of the patient were reviewed by me and considered in my medical decision making (see chart for details).   COVID screening ordered.  Will await results further recommendation.  Recommended follow-up in the emergency room with any worsening symptoms given past medical history and comorbidities.  Patient expresses understanding.  Final Clinical Impressions(s) / UC Diagnoses   Final diagnoses:  Exposure to COVID-19 virus   Discharge Instructions   None    ED Prescriptions   None    PDMP not reviewed this encounter.   Tomi Bamberger, PA-C 11/28/22 1246

## 2022-12-09 NOTE — Telephone Encounter (Signed)
[facility] is calling today b/c they still have not received order for DM supplies. Please re fax to [phone]..   Cb  [phone]

## 2022-12-12 NOTE — Telephone Encounter (Signed)
Called home delivered and will fax to another number  [phone]

## 2022-12-17 ENCOUNTER — Emergency Department (HOSPITAL_COMMUNITY)
Admission: EM | Admit: 2022-12-17 | Discharge: 2022-12-17 | Disposition: A | Discharge status: Home / Self Care | Payer: Medicaid Other | Attending: Emergency Medicine | Admitting: Emergency Medicine

## 2022-12-17 ENCOUNTER — Ambulatory Visit
Admission: EM | Admit: 2022-12-17 | Discharge: 2022-12-17 | Disposition: A | Discharge status: Other Acute Inpatient Hospital | Payer: Medicaid Other | Attending: Physician Assistant | Admitting: Physician Assistant

## 2022-12-17 ENCOUNTER — Emergency Department (HOSPITAL_COMMUNITY): Payer: Medicaid Other

## 2022-12-17 DIAGNOSIS — R42 Dizziness and giddiness: Secondary | ICD-10-CM | POA: Diagnosis not present

## 2022-12-17 DIAGNOSIS — B349 Viral infection, unspecified: Secondary | ICD-10-CM | POA: Diagnosis not present

## 2022-12-17 DIAGNOSIS — Z20822 Contact with and (suspected) exposure to covid-19: Secondary | ICD-10-CM | POA: Insufficient documentation

## 2022-12-17 DIAGNOSIS — R519 Headache, unspecified: Secondary | ICD-10-CM | POA: Diagnosis present

## 2022-12-17 LAB — CBC
HCT: 42.3 % (ref 39.0–52.0)
Hemoglobin: 13.6 g/dL (ref 13.0–17.0)
MCH: 28.2 pg (ref 26.0–34.0)
MCHC: 32.2 g/dL (ref 30.0–36.0)
MCV: 87.6 fL (ref 80.0–100.0)
Platelets: 328 10*3/uL (ref 150–400)
RBC: 4.83 MIL/uL (ref 4.22–5.81)
RDW: 14.7 % (ref 11.5–15.5)
WBC: 12.1 10*3/uL — ABNORMAL HIGH (ref 4.0–10.5)
nRBC: 0 % (ref 0.0–0.2)

## 2022-12-17 LAB — BASIC METABOLIC PANEL
Anion gap: 6 (ref 5–15)
BUN: 12 mg/dL (ref 8–23)
CO2: 27 mmol/L (ref 22–32)
Calcium: 8 mg/dL — ABNORMAL LOW (ref 8.9–10.3)
Chloride: 104 mmol/L (ref 98–111)
Creatinine, Ser: 0.46 mg/dL — ABNORMAL LOW (ref 0.61–1.24)
GFR, Estimated: >60 mL/min (ref >60)
Glucose, Bld: 126 mg/dL — ABNORMAL HIGH (ref 70–99)
Potassium: 3.8 mmol/L (ref 3.5–5.1)
Sodium: 137 mmol/L (ref 135–145)

## 2022-12-17 LAB — RESP PANEL BY RT-PCR (RSV, FLU A&B, COVID)  RVPGX2
Influenza A by PCR: NEGATIVE
Influenza B by PCR: NEGATIVE
Resp Syncytial Virus by PCR: NEGATIVE
SARS Coronavirus 2 by RT PCR: NEGATIVE

## 2022-12-17 LAB — POCT FASTING CBG KUC MANUAL ENTRY: POCT Glucose (KUC): 179 mg/dL — AB (ref 70–99)

## 2022-12-17 NOTE — Discharge Instructions (Addendum)
You can take Tylenol and ibuprofen at home.  Make sure that you are drinking plenty of fluids.  Your symptoms should begin to improve over the next couple of days.  Your COVID test here was negative, however there are many other viruses going around that we do not test for.

## 2022-12-17 NOTE — ED Triage Notes (Signed)
Pt BIB [facility] from [facility] for [facility] care. Pt has dizziness when he coughs since last night..A&O X4...Marland KitchenMarland Kitchen

## 2022-12-17 NOTE — ED Provider Notes (Signed)
[place] [facility] Provider Note   CSN: [id number] Arrival date & time: 12/17/22  1425     History  Chief Complaint  Patient presents with   Headache    Curtis Williamson is a 62 y.o. male.  This is a 62 year old male is here today for a cough and a headache.  Symptoms began last evening.  Says that he brought a friend to the hospital 2 nights ago for a "stomach bug."  He went to urgent care who sent him here.   Headache      Home Medications Prior to Admission medications   Medication Sig Start Date End Date Taking? Authorizing Provider  Accu-Chek Softclix Lancets lancets Use as instructed 10/13/22   Georganna Skeans, MD  albuterol (VENTOLIN HFA) 108 (90 Base) MCG/ACT inhaler Inhale 1-2 puffs into the lungs every 6 (six) hours as needed for wheezing. 10/03/22   Garlon Hatchet, PA-C  atorvastatin (LIPITOR) 10 MG tablet TAKE 1 TABLET (10 MG TOTAL) BY MOUTH DAILY. 01/18/21   Georganna Skeans, MD  Blood Glucose Monitoring Suppl (ACCU-CHEK GUIDE ME) w/Device KIT 1 each by Does not apply route 2 (two) times daily. Use to check FSBS twice a day. Dx: E11.9 09/22/20   Mayers, Cari S, PA-C  empagliflozin (JARDIANCE) 25 MG TABS tablet TAKE 1 TABLET BY MOUTH EVERY DAY BEFORE BREAKFAST 08/12/22   Georganna Skeans, MD  gabapentin (NEURONTIN) 300 MG capsule Take 1 capsule (300 mg total) by mouth 3 (three) times daily. 10/01/21 10/01/22  Georganna Skeans, MD  glucose blood (ACCU-CHEK GUIDE) test strip Use as instructed 09/22/20   Mayers, Cari S, PA-C  hydrOXYzine (ATARAX/VISTARIL) 25 MG tablet Take 1 tablet (25 mg total) by mouth every 6 (six) hours. 11/19/20   Mayers, Cari S, PA-C  latanoprost (XALATAN) 0.005 % ophthalmic solution SMARTSIG:1 Drop(s) In Eye(s) Every Evening 08/28/21   [provider]  lisinopril (ZESTRIL) 10 MG tablet TAKE 1 TABLET BY MOUTH EVERY DAY 10/05/22   Georganna Skeans, MD  metFORMIN (GLUCOPHAGE) 500 MG tablet TAKE 1 TABLET BY MOUTH TWICE A  DAY WITH FOOD 06/24/22   Georganna Skeans, MD  ondansetron (ZOFRAN-ODT) 4 MG disintegrating tablet Take 1 tablet (4 mg total) by mouth every 8 (eight) hours as needed for nausea or vomiting. 10/03/22   Jeanelle Malling, PA  oxyCODONE-acetaminophen (PERCOCET) 10-325 MG tablet  07/14/22   [provider]  traZODone (DESYREL) 50 MG tablet TAKE 0.5-1 TABLETS BY MOUTH AT BEDTIME AS NEEDED FOR SLEEP. 04/28/21   Georganna Skeans, MD  triamcinolone cream (KENALOG) 0.1 % Apply 1 application topically 2 (two) times daily. 07/11/19   [name], MD      Allergies    Mushroom extract complex    Review of Systems   Review of Systems  Neurological:  Positive for headaches.    Physical Exam Updated Vital Signs BP (!) 139/94   Pulse 78   Resp (!) 25   SpO2 (!) 83%  Physical Exam Vitals reviewed.  HENT:     Head: Normocephalic and atraumatic.  Eyes:     Extraocular Movements: Extraocular movements intact.     Pupils: Pupils are equal, round, and reactive to light.  Cardiovascular:     Rate and Rhythm: Normal rate.     Heart sounds: No murmur heard. Pulmonary:     Effort: Pulmonary effort is normal. No respiratory distress.  Abdominal:     Palpations: Abdomen is soft.  Musculoskeletal:     Cervical  back: Normal range of motion.  Neurological:     Mental Status: He is alert.     ED Results / Procedures / Treatments   Labs (all labs ordered are listed, but only abnormal results are displayed) Labs Reviewed  CBC - Abnormal; Notable for the following components:      Result Value   WBC 12.1 (*)    All other components within normal limits  BASIC METABOLIC PANEL - Abnormal; Notable for the following components:   Glucose, Bld 126 (*)    Creatinine, Ser 0.46 (*)    Calcium 8.0 (*)    All other components within normal limits  RESP PANEL BY RT-PCR (RSV, FLU A&B, COVID)  [id number]    EKG EKG Interpretation Date/Time:  Saturday December 17 2022 14:48:31 EDT Ventricular Rate:  92 PR  Interval:  163 QRS Duration:  81 QT Interval:  377 QTC Calculation: 467 R Axis:   -77  Text Interpretation: Sinus rhythm Consider left atrial enlargement LAD, consider left anterior fascicular block No acute changes Confirmed by Anders Simmonds [id number]) on 12/17/2022 3:32:41 PM  Radiology DG Chest Portable 1 View  Result Date: 12/17/2022 CLINICAL DATA:  Cough and dizziness since last night. EXAM: PORTABLE CHEST 1 VIEW COMPARISON:  Radiographs 10/03/2022 and 03/14/2020. FINDINGS: 1437 hours. The heart size and mediastinal contours are normal. The lungs are clear. There is no pleural effusion or pneumothorax. No acute osseous findings are identified. IMPRESSION: No active cardiopulmonary process. Electronically Signed   By: Carey Bullocks M.D.   On: 12/17/2022 15:50    Procedures Procedures    Medications Ordered in ED Medications - No data to display  ED Course/ Medical Decision Making/ A&P                                 Medical Decision Making 62 year old male here today for cough and headache.  Differential diagnoses include viral syndrome, less likely pneumonia, less likely meningitis.  Overall patient looks well.  Symptoms began last evening, and has had a cough, headache and sinus pressure.  The symptoms are most consistent with viral syndrome.  Basic labs ordered.  Chest x-ray ordered.  Reassessment-my independent review of the patient's chest x-ray shows no pneumonia.  Viral swabs negative.  Initial vital signs that were put in were incorrect.  It was listed that the patient's SpO2 was 83% with respirations of 25.  He is saturating 96% with respirations of 20 on room air.  Patient overall looks well.  Will discharge.   Amount and/or Complexity of Data Reviewed Labs: ordered. Radiology: ordered.           Final Clinical Impression(s) / ED Diagnoses Final diagnoses:  Viral syndrome    Rx / DC Orders ED Discharge Orders     None         Arletha Pili, DO 12/17/22 1636

## 2022-12-17 NOTE — ED Notes (Signed)
Patient is being discharged from the Urgent Care and sent to the Emergency Department via EMS . Per RM, patient is in need of higher level of care due to dizziness/confusion. Patient is aware and verbalizes understanding of plan of care.  Vitals:   12/17/22 1303 12/17/22 1342  BP:    Pulse: 93   Resp: (!) 22   Temp:  98.7 F (37.1 C)  SpO2: 95%

## 2022-12-17 NOTE — ED Notes (Signed)
Dr. Andria Meuse allowed PT to eat.   Sandwich offered and given to Pt.

## 2022-12-17 NOTE — ED Notes (Signed)
Patient is being discharged from the Urgent Care and sent to the Emergency Department via POV . Per RM, patient is in need of higher level of care due to dizziness. Patient is aware and verbalizes understanding of plan of care.  Vitals:   12/17/22 1301 12/17/22 1303  BP: 125/82   Pulse: 91 93  Resp: (!) 24 (!) 22  Temp: 98.7 F (37.1 C)   SpO2: 90% 95%

## 2022-12-17 NOTE — ED Triage Notes (Signed)
"  I have been Coughing all night and now with a headache which is throbbing with frontal head pressure". "I feel weak now, it is hard to sit up and walk in".   Daughter reports "he is a little confused". No nausea or vomiting. No chest pain. No sob.

## 2022-12-17 NOTE — ED Provider Notes (Addendum)
Patient here today for relation of coughing, dizziness and headache that started last night.  He states that he feels generalized weakness and has difficulty standing up and walking in.  Daughter reports he is somewhat confused today as well.  He denies any chest pain.  Recommended further evaluation in the emergency room.  Offered EMS transport however patient prefers to call friend for transport via POV.   Tomi Bamberger, PA-C 12/17/22 1315   Adddendum- patient unable to find transport to ED- EMS to transport.    Tomi Bamberger, PA-C 12/17/22 1356

## 2023-01-08 ENCOUNTER — Observation Stay (HOSPITAL_COMMUNITY)
Admission: EM | Admit: 2023-01-08 | Discharge: 2023-01-09 | Disposition: A | Discharge status: Home / Self Care | Payer: Medicaid Other | Attending: Student in an Organized Health Care Education/Training Program | Admitting: Student in an Organized Health Care Education/Training Program

## 2023-01-08 ENCOUNTER — Emergency Department (HOSPITAL_COMMUNITY): Payer: Medicaid Other

## 2023-01-08 ENCOUNTER — Other Ambulatory Visit: Payer: Self-pay

## 2023-01-08 ENCOUNTER — Encounter (HOSPITAL_COMMUNITY): Payer: Self-pay

## 2023-01-08 DIAGNOSIS — J40 Bronchitis, not specified as acute or chronic: Secondary | ICD-10-CM

## 2023-01-08 DIAGNOSIS — Z79899 Other long term (current) drug therapy: Secondary | ICD-10-CM | POA: Insufficient documentation

## 2023-01-08 DIAGNOSIS — E1122 Type 2 diabetes mellitus with diabetic chronic kidney disease: Secondary | ICD-10-CM | POA: Insufficient documentation

## 2023-01-08 DIAGNOSIS — Z7984 Long term (current) use of oral hypoglycemic drugs: Secondary | ICD-10-CM | POA: Diagnosis not present

## 2023-01-08 DIAGNOSIS — I129 Hypertensive chronic kidney disease with stage 1 through stage 4 chronic kidney disease, or unspecified chronic kidney disease: Secondary | ICD-10-CM | POA: Insufficient documentation

## 2023-01-08 DIAGNOSIS — J189 Pneumonia, unspecified organism: Principal | ICD-10-CM | POA: Insufficient documentation

## 2023-01-08 DIAGNOSIS — A419 Sepsis, unspecified organism: Secondary | ICD-10-CM | POA: Diagnosis not present

## 2023-01-08 DIAGNOSIS — F17200 Nicotine dependence, unspecified, uncomplicated: Secondary | ICD-10-CM | POA: Diagnosis not present

## 2023-01-08 DIAGNOSIS — E119 Type 2 diabetes mellitus without complications: Secondary | ICD-10-CM

## 2023-01-08 DIAGNOSIS — R0602 Shortness of breath: Secondary | ICD-10-CM | POA: Diagnosis present

## 2023-01-08 DIAGNOSIS — Z1152 Encounter for screening for COVID-19: Secondary | ICD-10-CM | POA: Diagnosis not present

## 2023-01-08 DIAGNOSIS — I10 Essential (primary) hypertension: Secondary | ICD-10-CM | POA: Diagnosis not present

## 2023-01-08 DIAGNOSIS — F17210 Nicotine dependence, cigarettes, uncomplicated: Secondary | ICD-10-CM | POA: Insufficient documentation

## 2023-01-08 DIAGNOSIS — E785 Hyperlipidemia, unspecified: Secondary | ICD-10-CM | POA: Diagnosis present

## 2023-01-08 DIAGNOSIS — N189 Chronic kidney disease, unspecified: Secondary | ICD-10-CM | POA: Diagnosis not present

## 2023-01-08 LAB — CBC
HCT: 44.8 % (ref 39.0–52.0)
Hemoglobin: 14.6 g/dL (ref 13.0–17.0)
MCH: 28.9 pg (ref 26.0–34.0)
MCHC: 32.6 g/dL (ref 30.0–36.0)
MCV: 88.7 fL (ref 80.0–100.0)
Platelets: 375 10*3/uL (ref 150–400)
RBC: 5.05 MIL/uL (ref 4.22–5.81)
RDW: 15.8 % — ABNORMAL HIGH (ref 11.5–15.5)
WBC: 13.7 10*3/uL — ABNORMAL HIGH (ref 4.0–10.5)
nRBC: 0 % (ref 0.0–0.2)

## 2023-01-08 LAB — COMPREHENSIVE METABOLIC PANEL
ALT: 26 U/L (ref 0–44)
AST: 17 U/L (ref 15–41)
Albumin: 3.7 g/dL (ref 3.5–5.0)
Alkaline Phosphatase: 79 U/L (ref 38–126)
Anion gap: 12 (ref 5–15)
BUN: 9 mg/dL (ref 8–23)
CO2: 24 mmol/L (ref 22–32)
Calcium: 9.1 mg/dL (ref 8.9–10.3)
Chloride: 104 mmol/L (ref 98–111)
Creatinine, Ser: 1.1 mg/dL (ref 0.61–1.24)
GFR, Estimated: >60 mL/min (ref >60)
Glucose, Bld: 157 mg/dL — ABNORMAL HIGH (ref 70–99)
Potassium: 3.8 mmol/L (ref 3.5–5.1)
Sodium: 140 mmol/L (ref 135–145)
Total Bilirubin: 0.6 mg/dL (ref 0.3–1.2)
Total Protein: 7.2 g/dL (ref 6.5–8.1)

## 2023-01-08 LAB — RESP PANEL BY RT-PCR (RSV, FLU A&B, COVID)  RVPGX2
Influenza A by PCR: NEGATIVE
Influenza B by PCR: NEGATIVE
Resp Syncytial Virus by PCR: NEGATIVE
SARS Coronavirus 2 by RT PCR: NEGATIVE

## 2023-01-08 LAB — LACTIC ACID, PLASMA
Lactic Acid, Venous: 2.6 mmol/L (ref 0.5–1.9)
Lactic Acid, Venous: 3.3 mmol/L (ref 0.5–1.9)

## 2023-01-08 LAB — GLUCOSE, CAPILLARY: Glucose-Capillary: 241 mg/dL — ABNORMAL HIGH (ref 70–99)

## 2023-01-08 MED ORDER — DM-GUAIFENESIN ER 30-600 MG PO TB12: 1.0000 | ORAL_TABLET | Freq: Two times a day (BID) | ORAL | Status: DC

## 2023-01-08 MED ORDER — ACETAMINOPHEN 325 MG PO TABS: 650.0000 mg | ORAL_TABLET | Freq: Three times a day (TID) | ORAL | Status: DC

## 2023-01-08 MED ORDER — LACTATED RINGERS IV BOLUS (SEPSIS)
1000.0000 mL | Freq: Once | INTRAVENOUS | Status: AC
  Administered 2023-01-08: 1000 mL via INTRAVENOUS

## 2023-01-08 MED ORDER — INSULIN ASPART 100 UNIT/ML IJ SOLN
0.0000 [IU] | Freq: Every day | INTRAMUSCULAR | Status: DC
  Administered 2023-01-08: 2 [IU] via SUBCUTANEOUS

## 2023-01-08 MED ORDER — MOMETASONE FURO-FORMOTEROL FUM 200-5 MCG/ACT IN AERO
2.0000 | INHALATION_SPRAY | Freq: Two times a day (BID) | RESPIRATORY_TRACT | Status: DC
  Administered 2023-01-09: 2 via RESPIRATORY_TRACT
  Filled 2023-01-08 (×2): qty 8.8

## 2023-01-08 MED ORDER — ACETAMINOPHEN 650 MG RE SUPP: 650.0000 mg | Freq: Four times a day (QID) | RECTAL | Status: DC | PRN

## 2023-01-08 MED ORDER — METHYLPREDNISOLONE SODIUM SUCC 125 MG IJ SOLR
60.0000 mg | Freq: Two times a day (BID) | INTRAMUSCULAR | Status: DC
  Administered 2023-01-09: 60 mg via INTRAVENOUS
  Filled 2023-01-08: qty 2

## 2023-01-08 MED ORDER — ALBUTEROL SULFATE (2.5 MG/3ML) 0.083% IN NEBU
2.5000 mg | INHALATION_SOLUTION | Freq: Once | RESPIRATORY_TRACT | Status: AC
  Administered 2023-01-08: 2.5 mg via RESPIRATORY_TRACT
  Filled 2023-01-08: qty 3

## 2023-01-08 MED ORDER — INSULIN ASPART 100 UNIT/ML IJ SOLN
0.0000 [IU] | Freq: Three times a day (TID) | INTRAMUSCULAR | Status: DC
  Administered 2023-01-09: 8 [IU] via SUBCUTANEOUS

## 2023-01-08 MED ORDER — OXYCODONE-ACETAMINOPHEN 10-325 MG PO TABS: 1.0000 | ORAL_TABLET | Freq: Three times a day (TID) | ORAL | Status: DC | PRN

## 2023-01-08 MED ORDER — ONDANSETRON HCL 4 MG/2ML IJ SOLN: 4.0000 mg | Freq: Four times a day (QID) | INTRAMUSCULAR | Status: DC | PRN

## 2023-01-08 MED ORDER — LACTATED RINGERS IV SOLN: INTRAVENOUS | Status: DC

## 2023-01-08 MED ORDER — IPRATROPIUM-ALBUTEROL 0.5-2.5 (3) MG/3ML IN SOLN
3.0000 mL | RESPIRATORY_TRACT | Status: AC
  Administered 2023-01-08 – 2023-01-09 (×3): 3 mL via RESPIRATORY_TRACT
  Filled 2023-01-08 (×3): qty 3

## 2023-01-08 MED ORDER — ONDANSETRON HCL 4 MG PO TABS: 4.0000 mg | ORAL_TABLET | Freq: Four times a day (QID) | ORAL | Status: DC | PRN

## 2023-01-08 MED ORDER — ATORVASTATIN CALCIUM 10 MG PO TABS
10.0000 mg | ORAL_TABLET | Freq: Every day | ORAL | Status: DC
  Administered 2023-01-08 – 2023-01-09 (×2): 10 mg via ORAL
  Filled 2023-01-08 (×2): qty 1

## 2023-01-08 MED ORDER — OXYCODONE-ACETAMINOPHEN 5-325 MG PO TABS: 1.0000 | ORAL_TABLET | Freq: Three times a day (TID) | ORAL | Status: DC | PRN

## 2023-01-08 MED ORDER — PREDNISONE 20 MG PO TABS: 40.0000 mg | ORAL_TABLET | Freq: Every day | ORAL | Status: DC

## 2023-01-08 MED ORDER — ACETAMINOPHEN 325 MG PO TABS: 650.0000 mg | ORAL_TABLET | Freq: Four times a day (QID) | ORAL | Status: DC | PRN

## 2023-01-08 MED ORDER — SENNOSIDES-DOCUSATE SODIUM 8.6-50 MG PO TABS: 1.0000 | ORAL_TABLET | Freq: Every evening | ORAL | Status: DC | PRN

## 2023-01-08 MED ORDER — IOHEXOL 350 MG/ML SOLN
75.0000 mL | Freq: Once | INTRAVENOUS | Status: AC | PRN
  Administered 2023-01-08: 75 mL via INTRAVENOUS

## 2023-01-08 MED ORDER — HYDROMORPHONE HCL 1 MG/ML IJ SOLN: 0.5000 mg | INTRAMUSCULAR | Status: DC | PRN

## 2023-01-08 MED ORDER — DM-GUAIFENESIN ER 30-600 MG PO TB12
1.0000 | ORAL_TABLET | Freq: Two times a day (BID) | ORAL | Status: DC
  Administered 2023-01-08 – 2023-01-09 (×2): 1 via ORAL
  Filled 2023-01-08 (×3): qty 1

## 2023-01-08 MED ORDER — SODIUM CHLORIDE 0.9 % IV SOLN
2.0000 g | INTRAVENOUS | Status: DC
  Administered 2023-01-08: 2 g via INTRAVENOUS
  Filled 2023-01-08: qty 20

## 2023-01-08 MED ORDER — HEPARIN SODIUM (PORCINE) 5000 UNIT/ML IJ SOLN
5000.0000 [IU] | Freq: Three times a day (TID) | INTRAMUSCULAR | Status: DC
  Administered 2023-01-08 – 2023-01-09 (×2): 5000 [IU] via SUBCUTANEOUS
  Filled 2023-01-08 (×2): qty 1

## 2023-01-08 MED ORDER — OXYCODONE HCL 5 MG PO TABS
5.0000 mg | ORAL_TABLET | Freq: Three times a day (TID) | ORAL | Status: DC | PRN
  Administered 2023-01-09: 5 mg via ORAL
  Filled 2023-01-08: qty 1

## 2023-01-08 MED ORDER — SODIUM CHLORIDE 0.9 % IV SOLN
500.0000 mg | INTRAVENOUS | Status: DC
  Administered 2023-01-08: 500 mg via INTRAVENOUS
  Filled 2023-01-08: qty 5

## 2023-01-08 MED ORDER — BENZONATATE 100 MG PO CAPS: 100.0000 mg | ORAL_CAPSULE | Freq: Three times a day (TID) | ORAL | Status: DC | PRN

## 2023-01-08 MED ORDER — ACETAMINOPHEN 500 MG PO TABS
1000.0000 mg | ORAL_TABLET | Freq: Three times a day (TID) | ORAL | Status: DC
  Administered 2023-01-08 – 2023-01-09 (×2): 1000 mg via ORAL
  Filled 2023-01-08 (×2): qty 2

## 2023-01-08 MED ORDER — TRAZODONE HCL 50 MG PO TABS: 50.0000 mg | ORAL_TABLET | Freq: Every evening | ORAL | Status: DC | PRN

## 2023-01-08 MED ORDER — METHYLPREDNISOLONE SODIUM SUCC 125 MG IJ SOLR
125.0000 mg | Freq: Once | INTRAMUSCULAR | Status: AC
  Administered 2023-01-08: 125 mg via INTRAVENOUS
  Filled 2023-01-08: qty 2

## 2023-01-08 MED ORDER — IPRATROPIUM-ALBUTEROL 0.5-2.5 (3) MG/3ML IN SOLN
3.0000 mL | Freq: Once | RESPIRATORY_TRACT | Status: AC
  Administered 2023-01-08: 3 mL via RESPIRATORY_TRACT
  Filled 2023-01-08: qty 3

## 2023-01-08 MED ORDER — NICOTINE 14 MG/24HR TD PT24
14.0000 mg | MEDICATED_PATCH | Freq: Every day | TRANSDERMAL | Status: DC
  Filled 2023-01-08: qty 1

## 2023-01-08 MED ORDER — METHYLPREDNISOLONE SODIUM SUCC 40 MG IJ SOLR: 40.0000 mg | Freq: Two times a day (BID) | INTRAMUSCULAR | Status: DC

## 2023-01-08 NOTE — ED Notes (Signed)
Pt reports improved breathing after neb.

## 2023-01-08 NOTE — Progress Notes (Signed)
[facility] following for sespis protocol.

## 2023-01-08 NOTE — ED Provider Notes (Signed)
[place] [facility] AT [facility] Provider Note   CSN: [id number] Arrival date & time: 01/08/23  1155     History  Chief Complaint  Patient presents with   URI    Curtis Williamson is a 62 y.o. male with PMHx cerebral aneurysm w/o rupture, CKD, DM, HTN, HLD who presents to ED concerned with cough, SOB, and rhinorrhea x3 days. Patient with known aneurysm that is currently being monitored. Patient also with chronic smoker's cough - states that cough today is much worse. States that his daughter's nebulizer has been providing him with good relief in the past - did not use today. Denying headache today.   Denies fever, chest pain, nausea, vomiting, diarrhea, dysuria, hematuria, hematochezia. Denies sick contact.    URI Presenting symptoms: cough        Home Medications Prior to Admission medications   Medication Sig Start Date End Date Taking? Authorizing Provider  Accu-Chek Softclix Lancets lancets Use as instructed 10/13/22   Georganna Skeans, MD  albuterol (VENTOLIN HFA) 108 (90 Base) MCG/ACT inhaler Inhale 1-2 puffs into the lungs every 6 (six) hours as needed for wheezing. 10/03/22   Garlon Hatchet, PA-C  atorvastatin (LIPITOR) 10 MG tablet TAKE 1 TABLET (10 MG TOTAL) BY MOUTH DAILY. 01/18/21   Georganna Skeans, MD  Blood Glucose Monitoring Suppl (ACCU-CHEK GUIDE ME) w/Device KIT 1 each by Does not apply route 2 (two) times daily. Use to check FSBS twice a day. Dx: E11.9 09/22/20   Mayers, Cari S, PA-C  empagliflozin (JARDIANCE) 25 MG TABS tablet TAKE 1 TABLET BY MOUTH EVERY DAY BEFORE BREAKFAST 08/12/22   Georganna Skeans, MD  gabapentin (NEURONTIN) 300 MG capsule Take 1 capsule (300 mg total) by mouth 3 (three) times daily. 10/01/21 10/01/22  Georganna Skeans, MD  glucose blood (ACCU-CHEK GUIDE) test strip Use as instructed 09/22/20   Mayers, Cari S, PA-C  hydrOXYzine (ATARAX/VISTARIL) 25 MG tablet Take 1 tablet (25 mg total) by mouth every 6 (six) hours. 11/19/20    Mayers, Cari S, PA-C  latanoprost (XALATAN) 0.005 % ophthalmic solution SMARTSIG:1 Drop(s) In Eye(s) Every Evening 08/28/21   [provider]  lisinopril (ZESTRIL) 10 MG tablet TAKE 1 TABLET BY MOUTH EVERY DAY 10/05/22   Georganna Skeans, MD  metFORMIN (GLUCOPHAGE) 500 MG tablet TAKE 1 TABLET BY MOUTH TWICE A DAY WITH FOOD 06/24/22   Georganna Skeans, MD  ondansetron (ZOFRAN-ODT) 4 MG disintegrating tablet Take 1 tablet (4 mg total) by mouth every 8 (eight) hours as needed for nausea or vomiting. 10/03/22   Jeanelle Malling, PA  oxyCODONE-acetaminophen (PERCOCET) 10-325 MG tablet  07/14/22   [provider]  traZODone (DESYREL) 50 MG tablet TAKE 0.5-1 TABLETS BY MOUTH AT BEDTIME AS NEEDED FOR SLEEP. 04/28/21   Georganna Skeans, MD  triamcinolone cream (KENALOG) 0.1 % Apply 1 application topically 2 (two) times daily. 07/11/19   [name], MD      Allergies    Mushroom extract complex    Review of Systems   Review of Systems  Respiratory:  Positive for cough and shortness of breath.     Physical Exam Updated Vital Signs BP 138/68   Pulse (!) 126   Temp 98.4 F (36.9 C) (Oral)   Resp (!) 34   Ht 5\' 11"  (1.803 m)   Wt 83.5 kg   SpO2 98%   BMI 25.67 kg/m  Physical Exam Vitals and nursing note reviewed.  Constitutional:      General: He is  not in acute distress.    Appearance: He is not ill-appearing or toxic-appearing.  HENT:     Head: Normocephalic and atraumatic.     Mouth/Throat:     Mouth: Mucous membranes are moist.  Eyes:     General: No scleral icterus.       Right eye: No discharge.        Left eye: No discharge.     Conjunctiva/sclera: Conjunctivae normal.  Cardiovascular:     Rate and Rhythm: Regular rhythm. Tachycardia present.     Pulses: Normal pulses.     Heart sounds: Normal heart sounds. No murmur heard. Pulmonary:     Effort: Respiratory distress present.     Breath sounds: Normal breath sounds. No wheezing, rhonchi or rales.     Comments: No  obvious wheezing, but patient does appear a little tight with breathing Musculoskeletal:     Right lower leg: No edema.     Left lower leg: No edema.  Skin:    General: Skin is warm and dry.     Findings: No rash.  Neurological:     General: No focal deficit present.     Mental Status: He is alert and oriented to person, place, and time. Mental status is at baseline.  Psychiatric:        Mood and Affect: Mood normal.        Behavior: Behavior normal.     ED Results / Procedures / Treatments   Labs (all labs ordered are listed, but only abnormal results are displayed) Labs Reviewed  COMPREHENSIVE METABOLIC PANEL - Abnormal; Notable for the following components:      Result Value   Glucose, Bld 157 (*)    All other components within normal limits  CBC - Abnormal; Notable for the following components:   WBC 13.7 (*)    RDW 15.8 (*)    All other components within normal limits  LACTIC ACID, PLASMA - Abnormal; Notable for the following components:   Lactic Acid, Venous 2.6 (*)    All other components within normal limits  LACTIC ACID, PLASMA - Abnormal; Notable for the following components:   Lactic Acid, Venous 3.3 (*)    All other components within normal limits  RESP PANEL BY RT-PCR (RSV, FLU A&B, COVID)  RVPGX2  CULTURE, BLOOD (SINGLE)  I-STAT VENOUS BLOOD GAS, ED    EKG None  Radiology CT Angio Chest PE W/Cm &/Or Wo Cm  Result Date: 01/08/2023 CLINICAL DATA:  Pulmonary embolism (PE) suspected, high prob EXAM: CT ANGIOGRAPHY CHEST WITH CONTRAST TECHNIQUE: Multidetector CT imaging of the chest was performed using the standard protocol during bolus administration of intravenous contrast. Multiplanar CT image reconstructions and MIPs were obtained to evaluate the vascular anatomy. RADIATION DOSE REDUCTION: This exam was performed according to the departmental dose-optimization program which includes automated exposure control, adjustment of the mA and/or kV according to  patient size and/or use of iterative reconstruction technique. CONTRAST:  75mL OMNIPAQUE IOHEXOL 350 MG/ML SOLN COMPARISON:  None Available. FINDINGS: Cardiovascular: Evaluation of the bases is limited due to respiratory motion. No pulmonary embolism through the segmental pulmonary arteries. Heart is mildly enlarged. No pericardial effusion. 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 Mediastinum/Nodes: No axillary or mediastinal adenopathy. Visualized thyroid is unremarkable. Lungs/Pleura: No pleural effusion or pneumothorax. Mild bronchial wall thickening. Scattered ground-glass opacities of the RIGHT apex.  Scattered tiny pulmonary nodules. Representative 3 mm pulmonary nodule of the LEFT lower lobe (series 7, image 191). Largest is a LEFT upper lobe pulmonary nodule measuring 5 by 4 mm (series 7, image 133). Upper Abdomen: No acute abnormality. Musculoskeletal: No chest wall abnormality. No acute or significant osseous findings. Review of the MIP images confirms the above findings. IMPRESSION: 1. No pulmonary embolism through the segmental pulmonary arteries. 2. Mild bronchial wall thickening with scattered ground-glass opacities of the RIGHT apex. Findings are nonspecific but could reflect atypical infection. 3. Multiple pulmonary nodules. Most significant: Less than 6 mm solid pulmonary nodule. Per Fleischner Society Guidelines, no routine follow-up imaging is recommended. These guidelines do not apply to immunocompromised patients and patients with cancer. Follow up in patients with significant comorbidities as clinically warranted. For lung cancer screening, adhere to Lung-RADS guidelines. Reference: Radiology. 2017; 284(1):228-43. Electronically Signed   By: Meda Klinefelter M.D.   On: 01/08/2023 15:58   DG Chest 1 View  Result Date: 01/08/2023 CLINICAL DATA:  141880  SOB (shortness of breath) 141880 EXAM: CHEST  1 VIEW COMPARISON:  December 17, 2022 FINDINGS: The cardiomediastinal silhouette is unchanged in contour. No pleural effusion. No pneumothorax. No acute pleuroparenchymal abnormality. IMPRESSION: No acute cardiopulmonary abnormality. Electronically Signed   By: Meda Klinefelter M.D.   On: 01/08/2023 13:00    Procedures .Critical Care  Performed by: Dorthy Cooler, PA-C Authorized by: Dorthy Cooler, PA-C   Critical care provider statement:    Critical care time (minutes):  30   Critical care was necessary to treat or prevent imminent or life-threatening deterioration of the following conditions:  Sepsis   Critical care was time spent personally by me on the following activities:  Development of treatment plan with patient or surrogate, discussions with consultants, evaluation of patient's response to treatment, examination of patient, ordering and review of laboratory studies, ordering and review of radiographic studies, ordering and performing treatments and interventions, pulse oximetry, re-evaluation of patient's condition and review of old charts   I assumed direction of critical care for this patient from another provider in my specialty: yes     Care discussed with: admitting provider       Medications Ordered in ED Medications  lactated ringers infusion ( Intravenous New Bag/Given 01/08/23 1731)  cefTRIAXone (ROCEPHIN) 2 g in sodium chloride 0.9 % 100 mL IVPB (0 g Intravenous Stopped 01/08/23 1517)  azithromycin (ZITHROMAX) 500 mg in sodium chloride 0.9 % 250 mL IVPB (0 mg Intravenous Stopped 01/08/23 1627)  methylPREDNISolone sodium succinate (SOLU-MEDROL) 125 mg/2 mL injection 125 mg (has no administration in time range)  albuterol (PROVENTIL) (2.5 MG/3ML) 0.083% nebulizer solution 2.5 mg (has no administration in time range)  ipratropium-albuterol (DUONEB) 0.5-2.5 (3) MG/3ML nebulizer solution 3 mL (3 mLs Nebulization Given  01/08/23 1242)  lactated ringers bolus 1,000 mL (0 mLs Intravenous Stopped 01/08/23 1730)    And  lactated ringers bolus 1,000 mL (0 mLs Intravenous Stopped 01/08/23 1730)    And  lactated ringers bolus 1,000 mL (0 mLs Intravenous Stopped 01/08/23 1627)  ipratropium-albuterol (DUONEB) 0.5-2.5 (3) MG/3ML nebulizer solution 3 mL (3 mLs Nebulization Given 01/08/23 1421)  iohexol (OMNIPAQUE) 350 MG/ML injection 75 mL (75 mLs Intravenous Contrast Given 01/08/23 1525)    ED Course/ Medical Decision Making/ A&P                                 Medical Decision  Making Amount and/or Complexity of Data Reviewed Labs: ordered. Radiology: ordered.  Risk Prescription drug management.   This patient presents to the ED for concern of shortness of breath, this involves an extensive number of treatment options, and is a complaint that carries with it a high risk of complications and morbidity.  The differential diagnosis includes Anxiety, Anaphylaxis/Angioedema, Aspirated FB, Arrhythmia, CHF, Asthma, COPD, PNA, COVID/Flu/RSV, STEMI, Tamponade, TPNX, DKA, Sepsis, Toxin   Co morbidities that complicate the patient evaluation  cerebral aneurysm w/o rupture, CKD, DM, HTN, HLD    Additional history obtained:  [facility] patient   Lab Tests:  I Ordered, and personally interpreted labs.  The pertinent results include:   - CMP: no concern for electrolyte abnormality; no concern for kidney/liver damage - CBC: Leukocytosis at 13.7.  No anemia. - VBG: pending - Respiratory Panel: Negative   Imaging Studies ordered:  I ordered imaging studies including  -chest xray/CTA chest: Assess for process contributing patient's symptoms I independently visualized and interpreted imaging I agree with the radiologist interpretation   Cardiac Monitoring: / EKG:  The patient was maintained on a cardiac monitor.  I personally viewed and interpreted the cardiac monitored which showed an underlying  rhythm of: sinus tachycardia   Problem List / ED Course / Critical interventions / Medication management  Admitting patient for PNA and sepsis Presenting to ED concerned for cough and SOB x3 days. Does have history of smoker cough - but this cough is worse. Denying any other infectious symptom.  Patient met SIRS criteria on arrival with tachypnea, tachycardia. Provided patient with 2 doses of duoneb and patient still admitting to same level of SOB. Giving patient another albuterol treatment now.  CBC with leukocytosis at 13.7. LA 2.6 -> 3.3 despite IV fluids. Resp panel negative. VBG pending. Blood cultures pending.  CTA chest showing developing atypical PNA. Patient has been started on Azithromycin and rocephin. Has been getting IV LR. Giving Solumedrol now.  Given that patient appears to be in respiratory distress, I believe it is in his best interest to receive inpatient admission.  I have reviewed the patients home medicines and have made adjustments as needed Consulted with Dr. Joneen Roach who agrees to admit patient.  DDx: These are considered less likely due to history of present illness and physical exam findings Arrhythmia/STEMI: EKG without concern Tamponade:CT without concern CHF: no physical exam findings TPNX: Lungs clear to auscultation bilaterally    Social Determinants of Health:  none          Final Clinical Impression(s) / ED Diagnoses Final diagnoses:  Sepsis, due to unspecified organism, unspecified whether acute organ dysfunction present [facility])  Atypical pneumonia    Rx / DC Orders ED Discharge Orders     None         Margarita Rana 01/08/23 1914    Laurence Spates, MD 01/09/23 1559

## 2023-01-08 NOTE — ED Notes (Signed)
Pt was able to go to the restroom w/out assistance

## 2023-01-08 NOTE — Progress Notes (Signed)
Patient refused CPAP for the night.  °

## 2023-01-08 NOTE — ED Notes (Signed)
ED TO INPATIENT HANDOFF REPORT  ED Nurse Name and Phone #: [phone]  S Name/Age/Gender Curtis Williamson 62 y.o. male Room/Bed: 012C/012C  Code Status   Code Status: Full Code  Home/SNF/Other Home Patient oriented to: self, place, time, and situation Is this baseline? Yes   Triage Complete: Triage complete  Chief Complaint Atypical pneumonia [J18.9]  Triage Note Pt c/o dry cough, SOB and runny nosex2-3d. Pt is tachypneic. Pt having continuous coughing spells in triage.    Allergies Allergies  Allergen Reactions   Mushroom Extract Complex     Level of Care/Admitting Diagnosis ED Disposition     ED Disposition  Admit   Condition  --   Comment  Hospital Area: [facility] [[id number]]  Level of Care: Telemetry Medical [104]  May admit patient to [facility] or [facility] if equivalent level of care is available:: Yes  Covid Evaluation: Confirmed COVID Negative  Diagnosis: Atypical pneumonia [213086]  Admitting Physician: Gery Pray [[id number]]  Attending Physician: Gery Pray [[id number]]  Certification:: I certify this patient will need inpatient services for at least 2 midnights  Expected Medical Readiness: 01/10/2023          B Medical/Surgery History Past Medical History:  Diagnosis Date   Cerebral aneurysm without rupture    Chronic kidney disease    only has one kidney   Diabetes mellitus without complication (HCC)    Essential hypertension    Hyperlipidemia    Hypertension    Phreesia 01/13/2020   Past Surgical History:  Procedure Laterality Date   KIDNEY DONATION     MANDIBLE FRACTURE SURGERY     ROTATOR CUFF REPAIR Right      A IV Location/Drains/Wounds Patient Lines/Drains/Airways Status     Active Line/Drains/Airways     Name Placement date Placement time Site Days   Peripheral IV 01/08/23 20 G Anterior;Right Forearm 01/08/23  1425  Forearm  less than 1   Peripheral IV 01/08/23 20 G Anterior;Proximal;Right Forearm  01/08/23  1601  Forearm  less than 1            Intake/Output Last 24 hours  Intake/Output Summary (Last 24 hours) at 01/08/2023 2002 Last data filed at 01/08/2023 1730 Gross per 24 hour  Intake 3350 ml  Output --  Net 3350 ml    Labs/Imaging Results for orders placed or performed during the hospital encounter of 01/08/23 (from the past 48 hour(s))  Resp panel by RT-PCR (RSV, Flu A&B, Covid) Anterior Nasal Swab     Status: None   Collection Time: 01/08/23 12:06 PM   Specimen: Anterior Nasal Swab  Result Value Ref Range   SARS Coronavirus 2 by RT PCR NEGATIVE NEGATIVE   Influenza A by PCR NEGATIVE NEGATIVE   Influenza B by PCR NEGATIVE NEGATIVE    Comment: (NOTE) The Xpert Xpress SARS-CoV-2/FLU/RSV plus assay is intended as an aid in the diagnosis of influenza from Nasopharyngeal swab specimens and should not be used as a sole basis for treatment. Nasal washings and aspirates are unacceptable for Xpert Xpress SARS-CoV-2/FLU/RSV testing.  Fact Sheet for Patients: BloggerCourse.com  Fact Sheet for Healthcare Providers: SeriousBroker.it  This test is not yet approved or cleared by the [place] FDA and has been authorized for detection and/or diagnosis of SARS-CoV-2 by FDA under an Emergency Use Authorization (EUA). This EUA will remain in effect (meaning this test can be used) for the duration of the COVID-19 declaration under Section 564(b)(1) of the Act, 21 U.S.C. section 360bbb-3(b)(1), unless  the authorization is terminated or revoked.     Resp Syncytial Virus by PCR NEGATIVE NEGATIVE    Comment: (NOTE) Fact Sheet for Patients: BloggerCourse.com  Fact Sheet for Healthcare Providers: SeriousBroker.it  This test is not yet approved or cleared by the [place] FDA and has been authorized for detection and/or diagnosis of SARS-CoV-2 by FDA under an Emergency  Use Authorization (EUA). This EUA will remain in effect (meaning this test can be used) for the duration of the COVID-19 declaration under Section 564(b)(1) of the Act, 21 U.S.C. section 360bbb-3(b)(1), unless the authorization is terminated or revoked.  Performed at [facility], [address] [place], [place] [address]   Comprehensive metabolic panel     Status: Abnormal   Collection Time: 01/08/23 12:06 PM  Result Value Ref Range   Sodium 140 135 - 145 mmol/L   Potassium 3.8 3.5 - 5.1 mmol/L   Chloride 104 98 - 111 mmol/L   CO2 24 22 - 32 mmol/L   Glucose, Bld 157 (H) 70 - 99 mg/dL    Comment: Glucose reference range applies only to samples taken after fasting for at least 8 hours.   BUN 9 8 - 23 mg/dL   Creatinine, Ser 8.46 0.61 - 1.24 mg/dL   Calcium 9.1 8.9 - 96.2 mg/dL   Total Protein 7.2 6.5 - 8.1 g/dL   Albumin 3.7 3.5 - 5.0 g/dL   AST 17 15 - 41 U/L   ALT 26 0 - 44 U/L   Alkaline Phosphatase 79 38 - 126 U/L   Total Bilirubin 0.6 0.3 - 1.2 mg/dL   GFR, Estimated >95 >28 mL/min    Comment: (NOTE) Calculated using the CKD-EPI Creatinine Equation (2021)    Anion gap 12 5 - 15    Comment: Performed at [facility], [address] [place], [place] [address]  CBC     Status: Abnormal   Collection Time: 01/08/23 12:06 PM  Result Value Ref Range   WBC 13.7 (H) 4.0 - 10.5 K/uL   RBC 5.05 4.22 - 5.81 MIL/uL   Hemoglobin 14.6 13.0 - 17.0 g/dL   HCT 40.1 02.7 - 25.3 %   MCV 88.7 80.0 - 100.0 fL   MCH 28.9 26.0 - 34.0 pg   MCHC 32.6 30.0 - 36.0 g/dL   RDW 66.4 (H) 40.3 - 47.4 %   Platelets 375 150 - 400 K/uL   nRBC 0.0 0.0 - 0.2 %    Comment: Performed at [facility], [address], [place], [place] [address]  Lactic acid, plasma     Status: Abnormal   Collection Time: 01/08/23  2:28 PM  Result Value Ref Range   Lactic Acid, Venous 2.6 (HH) 0.5 - 1.9 mmol/L    Comment: FIRST CALL ATTEMPT AT 1528 AND SECOND ATTEMPT AT 1553 CRITICAL RESULT CALLED  TO, READ BACK BY AND VERIFIED WITHNicholes Rough RN AT [phone] BY D LONG Performed at [facility], [address], [place], [place] [address]   Lactic acid, plasma     Status: Abnormal   Collection Time: 01/08/23  5:35 PM  Result Value Ref Range   Lactic Acid, Venous 3.3 (HH) 0.5 - 1.9 mmol/L    Comment: CRITICAL VALUE NOTED. VALUE IS CONSISTENT WITH PREVIOUSLY REPORTED/CALLED VALUE Performed at [facility], [address], [place], [place] [address]    CT Angio Chest PE W/Cm &/Or Wo Cm  Result Date: 01/08/2023  CLINICAL DATA:  Pulmonary embolism (PE) suspected, high prob EXAM: CT ANGIOGRAPHY CHEST WITH CONTRAST TECHNIQUE: Multidetector CT imaging of the chest was performed using the standard protocol during bolus administration of intravenous contrast. Multiplanar CT image reconstructions and MIPs were obtained to evaluate the vascular anatomy. RADIATION DOSE REDUCTION: This exam was performed according to the departmental dose-optimization program which includes automated exposure control, adjustment of the mA and/or kV according to patient size and/or use of iterative reconstruction technique. CONTRAST:  75mL OMNIPAQUE IOHEXOL 350 MG/ML SOLN COMPARISON:  None Available. FINDINGS: Cardiovascular: Evaluation of the bases is limited due to respiratory motion. No pulmonary embolism through the segmental pulmonary arteries. Heart is mildly enlarged. No pericardial effusion. 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 Mediastinum/Nodes: No axillary or mediastinal adenopathy. Visualized thyroid is unremarkable. Lungs/Pleura: No pleural effusion or pneumothorax. Mild bronchial wall thickening. Scattered ground-glass opacities of the RIGHT apex. Scattered tiny pulmonary nodules. Representative 3 mm pulmonary nodule of the LEFT lower lobe (series 7, image  191). Largest is a LEFT upper lobe pulmonary nodule measuring 5 by 4 mm (series 7, image 133). Upper Abdomen: No acute abnormality. Musculoskeletal: No chest wall abnormality. No acute or significant osseous findings. Review of the MIP images confirms the above findings. IMPRESSION: 1. No pulmonary embolism through the segmental pulmonary arteries. 2. Mild bronchial wall thickening with scattered ground-glass opacities of the RIGHT apex. Findings are nonspecific but could reflect atypical infection. 3. Multiple pulmonary nodules. Most significant: Less than 6 mm solid pulmonary nodule. Per Fleischner Society Guidelines, no routine follow-up imaging is recommended. These guidelines do not apply to immunocompromised patients and patients with cancer. Follow up in patients with significant comorbidities as clinically warranted. For lung cancer screening, adhere to Lung-RADS guidelines. Reference: Radiology. 2017; 284(1):228-43. Electronically Signed   By: Meda Klinefelter M.D.   On: 01/08/2023 15:58   DG Chest 1 View  Result Date: 01/08/2023 CLINICAL DATA:  141880 SOB (shortness of breath) 141880 EXAM: CHEST  1 VIEW COMPARISON:  December 17, 2022 FINDINGS: The cardiomediastinal silhouette is unchanged in contour. No pleural effusion. No pneumothorax. No acute pleuroparenchymal abnormality. IMPRESSION: No acute cardiopulmonary abnormality. Electronically Signed   By: Meda Klinefelter M.D.   On: 01/08/2023 13:00    Pending Labs Unresulted Labs (From admission, onward)     Start     Ordered   01/09/23 0500  Basic metabolic panel  Tomorrow morning,   R        01/08/23 1948   01/09/23 0500  Magnesium  Tomorrow morning,   R        01/08/23 1948   01/09/23 0500  CBC with Differential/Platelet  Tomorrow morning,   R        01/08/23 1948   01/08/23 2000  HIV Antibody (routine testing w rflx)  (HIV Antibody (Routine testing w reflex) panel)  Once,   R        01/08/23 2000   01/08/23 1247  Culture, blood  (single)  (Undifferentiated -> Now sepsis confirmed (treatment and sepsis specific nursing orders))  ONCE - STAT,   STAT        01/08/23 1327            Vitals/Pain Today's Vitals   01/08/23 1240 01/08/23 1300 01/08/23 1330 01/08/23 1600  BP: (!) 141/74 134/70 139/79 138/68  Pulse: (!) 108 (!) 115 (!) 114 Marland Kitchen)  126  Resp: (!) 32 (!) 28 (!) 23 (!) 34  Temp:      TempSrc:      SpO2: 100% 98% 93% 98%  Weight:      Height:      PainSc:        Isolation Precautions No active isolations  Medications Medications  lactated ringers infusion ( Intravenous New Bag/Given 01/08/23 1731)  cefTRIAXone (ROCEPHIN) 2 g in sodium chloride 0.9 % 100 mL IVPB (0 g Intravenous Stopped 01/08/23 1517)  azithromycin (ZITHROMAX) 500 mg in sodium chloride 0.9 % 250 mL IVPB (0 mg Intravenous Stopped 01/08/23 1627)  insulin aspart (novoLOG) injection 0-15 Units (has no administration in time range)  insulin aspart (novoLOG) injection 0-5 Units (has no administration in time range)  benzonatate (TESSALON) capsule 100 mg (has no administration in time range)  heparin injection 5,000 Units (has no administration in time range)  acetaminophen (TYLENOL) tablet 650 mg (has no administration in time range)    Or  acetaminophen (TYLENOL) suppository 650 mg (has no administration in time range)  senna-docusate (Senokot-S) tablet 1 tablet (has no administration in time range)  ondansetron (ZOFRAN) tablet 4 mg (has no administration in time range)    Or  ondansetron (ZOFRAN) injection 4 mg (has no administration in time range)  nicotine (NICODERM CQ - dosed in mg/24 hours) patch 14 mg (has no administration in time range)  ipratropium-albuterol (DUONEB) 0.5-2.5 (3) MG/3ML nebulizer solution 3 mL (has no administration in time range)  dextromethorphan-guaiFENesin (MUCINEX DM) 30-600 MG per 12 hr tablet 1 tablet (has no administration in time range)  methylPREDNISolone sodium succinate (SOLU-MEDROL) 40 mg/mL injection  40 mg (has no administration in time range)    Followed by  predniSONE (DELTASONE) tablet 40 mg (has no administration in time range)  mometasone-formoterol (DULERA) 200-5 MCG/ACT inhaler 2 puff (has no administration in time range)  acetaminophen (TYLENOL) tablet 650 mg (has no administration in time range)  ipratropium-albuterol (DUONEB) 0.5-2.5 (3) MG/3ML nebulizer solution 3 mL (3 mLs Nebulization Given 01/08/23 1242)  lactated ringers bolus 1,000 mL (0 mLs Intravenous Stopped 01/08/23 1730)    And  lactated ringers bolus 1,000 mL (0 mLs Intravenous Stopped 01/08/23 1730)    And  lactated ringers bolus 1,000 mL (0 mLs Intravenous Stopped 01/08/23 1627)  ipratropium-albuterol (DUONEB) 0.5-2.5 (3) MG/3ML nebulizer solution 3 mL (3 mLs Nebulization Given 01/08/23 1421)  iohexol (OMNIPAQUE) 350 MG/ML injection 75 mL (75 mLs Intravenous Contrast Given 01/08/23 1525)  methylPREDNISolone sodium succinate (SOLU-MEDROL) 125 mg/2 mL injection 125 mg (125 mg Intravenous Given 01/08/23 1927)  albuterol (PROVENTIL) (2.5 MG/3ML) 0.083% nebulizer solution 2.5 mg (2.5 mg Nebulization Given 01/08/23 1926)    Mobility walks     Focused Assessments Pneumonia   R Recommendations: See Admitting Provider Note  Report given to:   Additional Notes:

## 2023-01-08 NOTE — ED Notes (Signed)
Pt back from CT, well appearing

## 2023-01-08 NOTE — ED Notes (Signed)
Pt up to bathroom with stable gait.

## 2023-01-08 NOTE — ED Notes (Signed)
Pt to CT, well appearing upon transport.

## 2023-01-08 NOTE — H&P (Incomplete)
PCP:   Georganna Skeans, MD   Chief Complaint:  Cough  HPI: This is a 62 year old male with past medical history of HTN, HLD, T2DM, tobacco use.  At baseline he has a chronic cough however for the past 3 days he has been coughing so hard his chest hurt.  It is a dry cough.  He additionally endorses wheezing, joints aching and not being able to catch his breath.  Endorses mild fevers and chills but no nausea, vomiting or diarrhea.  No diaphoresis, headache, lightheadedness or dizziness. He came to the ER.  In the ER patient Tmax 100.3, presenting vitals 126/107, HR 104, RR 22.  Lactic acid is 2.3 => 3.3.  WBC 13.7, respiratory panel negative.  CT chest atypical pneumonia, multiple pulmonary nodules.  EKG sinus tach.  Blood cultures x 2 collected.  3 L LR bolus.  Patient given Rocephin and azithromycin.  Patient stable on room air.  Review of Systems:  Per HPI  Past Medical History: Past Medical History:  Diagnosis Date   Cerebral aneurysm without rupture    Chronic kidney disease    only has one kidney   Diabetes mellitus without complication (HCC)    Essential hypertension    Hyperlipidemia    Hypertension    Phreesia 01/13/2020   Past Surgical History:  Procedure Laterality Date   KIDNEY DONATION     MANDIBLE FRACTURE SURGERY     ROTATOR CUFF REPAIR Right     Medications: Prior to Admission medications   Medication Sig Start Date End Date Taking? Authorizing Provider  Accu-Chek Softclix Lancets lancets Use as instructed 10/13/22   Georganna Skeans, MD  albuterol (VENTOLIN HFA) 108 (90 Base) MCG/ACT inhaler Inhale 1-2 puffs into the lungs every 6 (six) hours as needed for wheezing. 10/03/22   Garlon Hatchet, PA-C  atorvastatin (LIPITOR) 10 MG tablet TAKE 1 TABLET (10 MG TOTAL) BY MOUTH DAILY. 01/18/21   Georganna Skeans, MD  Blood Glucose Monitoring Suppl (ACCU-CHEK GUIDE ME) w/Device KIT 1 each by Does not apply route 2 (two) times daily. Use to check FSBS twice a day. Dx: E11.9  09/22/20   Mayers, Cari S, PA-C  empagliflozin (JARDIANCE) 25 MG TABS tablet TAKE 1 TABLET BY MOUTH EVERY DAY BEFORE BREAKFAST 08/12/22   Georganna Skeans, MD  gabapentin (NEURONTIN) 300 MG capsule Take 1 capsule (300 mg total) by mouth 3 (three) times daily. 10/01/21 10/01/22  Georganna Skeans, MD  glucose blood (ACCU-CHEK GUIDE) test strip Use as instructed 09/22/20   Mayers, Cari S, PA-C  hydrOXYzine (ATARAX/VISTARIL) 25 MG tablet Take 1 tablet (25 mg total) by mouth every 6 (six) hours. 11/19/20   Mayers, Cari S, PA-C  latanoprost (XALATAN) 0.005 % ophthalmic solution SMARTSIG:1 Drop(s) In Eye(s) Every Evening 08/28/21   [provider]  lisinopril (ZESTRIL) 10 MG tablet TAKE 1 TABLET BY MOUTH EVERY DAY 10/05/22   Georganna Skeans, MD  metFORMIN (GLUCOPHAGE) 500 MG tablet TAKE 1 TABLET BY MOUTH TWICE A DAY WITH FOOD 06/24/22   Georganna Skeans, MD  ondansetron (ZOFRAN-ODT) 4 MG disintegrating tablet Take 1 tablet (4 mg total) by mouth every 8 (eight) hours as needed for nausea or vomiting. 10/03/22   Jeanelle Malling, PA  oxyCODONE-acetaminophen (PERCOCET) 10-325 MG tablet  07/14/22   [provider]  traZODone (DESYREL) 50 MG tablet TAKE 0.5-1 TABLETS BY MOUTH AT BEDTIME AS NEEDED FOR SLEEP. 04/28/21   Georganna Skeans, MD  triamcinolone cream (KENALOG) 0.1 % Apply 1 application topically 2 (two) times daily.  07/11/19   [name], MD    Allergies:   Allergies  Allergen Reactions   Mushroom Extract Complex     Social History:  reports that he has been smoking cigarettes. He has a 9.9 pack-year smoking history. He has never used smokeless tobacco. He reports current alcohol use. He reports that he does not use drugs.  Family History: Family History  Problem Relation Age of Onset   Hypertension Mother    Kidney failure Mother    Kidney disease Mother    Heart disease Mother    ADD / ADHD Daughter    Anxiety disorder Daughter    Alcohol abuse Father    Colon cancer Neg Hx    Stomach  cancer Neg Hx    Esophageal cancer Neg Hx    Colon polyps Neg Hx     Physical Exam: Vitals:   01/08/23 1240 01/08/23 1300 01/08/23 1330 01/08/23 1600  BP: (!) 141/74 134/70 139/79 138/68  Pulse: (!) 108 (!) 115 (!) 114 (!) 126  Resp: (!) 32 (!) 28 (!) 23 (!) 34  Temp:      TempSrc:      SpO2: 100% 98% 93% 98%  Weight:      Height:        General: A and O x 3, well developed and nourished, ill-appearing Eyes: Pink conjunctiva, no scleral icterus ENT: Moist oral mucosa, neck supple, no thyromegaly Lungs: Tight, mild wheeze RUL, no use of accessory muscles Cardiovascular: Tachycardia, RRR,, no murmurs. No carotid bruits, no JVD Abdomen: soft, positive BS, NTND, no organomegaly, not an acute abdomen GU: not examined Neuro: CN II - XII grossly intact, sensation intact Musculoskeletal: strength 5/5 all extremities, no clubbing, cyanosis or edema Skin: no rash, no subcutaneous crepitation, no decubitus Psych: appropriate patient  Labs on Admission:  Recent Labs    01/08/23 1206  NA 140  K 3.8  CL 104  CO2 24  GLUCOSE 157*  BUN 9  CREATININE 1.10  CALCIUM 9.1   Recent Labs    01/08/23 1206  AST 17  ALT 26  ALKPHOS 79  BILITOT 0.6  PROT 7.2  ALBUMIN 3.7    Recent Labs    01/08/23 1206  WBC 13.7*  HGB 14.6  HCT 44.8  MCV 88.7  PLT 375    Micro Results: Recent Results (from the past 240 hour(s))  Resp panel by RT-PCR (RSV, Flu A&B, Covid) Anterior Nasal Swab     Status: None   Collection Time: 01/08/23 12:06 PM   Specimen: Anterior Nasal Swab  Result Value Ref Range Status   SARS Coronavirus 2 by RT PCR NEGATIVE NEGATIVE Final   Influenza A by PCR NEGATIVE NEGATIVE Final   Influenza B by PCR NEGATIVE NEGATIVE Final    Comment: (NOTE) The Xpert Xpress SARS-CoV-2/FLU/RSV plus assay is intended as an aid in the diagnosis of influenza from Nasopharyngeal swab specimens and should not be used as a sole basis for treatment. Nasal washings and aspirates are  unacceptable for Xpert Xpress SARS-CoV-2/FLU/RSV testing.  Fact Sheet for Patients: BloggerCourse.com  Fact Sheet for Healthcare Providers: SeriousBroker.it  This test is not yet approved or cleared by the [place] FDA and has been authorized for detection and/or diagnosis of SARS-CoV-2 by FDA under an Emergency Use Authorization (EUA). This EUA will remain in effect (meaning this test can be used) for the duration of the COVID-19 declaration under Section 564(b)(1) of the Act, 21 U.S.C. section 360bbb-3(b)(1), unless the authorization  is terminated or revoked.     Resp Syncytial Virus by PCR NEGATIVE NEGATIVE Final    Comment: (NOTE) Fact Sheet for Patients: BloggerCourse.com  Fact Sheet for Healthcare Providers: SeriousBroker.it  This test is not yet approved or cleared by the [place] FDA and has been authorized for detection and/or diagnosis of SARS-CoV-2 by FDA under an Emergency Use Authorization (EUA). This EUA will remain in effect (meaning this test can be used) for the duration of the COVID-19 declaration under Section 564(b)(1) of the Act, 21 U.S.C. section 360bbb-3(b)(1), unless the authorization is terminated or revoked.  Performed at [facility], [address] [place], [place] [address]      Radiological Exams on Admission: CT Angio Chest PE W/Cm &/Or Wo Cm  Result Date: 01/08/2023 CLINICAL DATA:  Pulmonary embolism (PE) suspected, high prob EXAM: CT ANGIOGRAPHY CHEST WITH CONTRAST TECHNIQUE: Multidetector CT imaging of the chest was performed using the standard protocol during bolus administration of intravenous contrast. Multiplanar CT image reconstructions and MIPs were obtained to evaluate the vascular anatomy. RADIATION DOSE REDUCTION: This exam was performed according to the departmental dose-optimization program which includes automated  exposure control, adjustment of the mA and/or kV according to patient size and/or use of iterative reconstruction technique. CONTRAST:  75mL OMNIPAQUE IOHEXOL 350 MG/ML SOLN COMPARISON:  None Available. FINDINGS: Cardiovascular: Evaluation of the bases is limited due to respiratory motion. No pulmonary embolism through the segmental pulmonary arteries. Heart is mildly enlarged. No pericardial effusion. 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 Mediastinum/Nodes: No axillary or mediastinal adenopathy. Visualized thyroid is unremarkable. Lungs/Pleura: No pleural effusion or pneumothorax. Mild bronchial wall thickening. Scattered ground-glass opacities of the RIGHT apex. Scattered tiny pulmonary nodules. Representative 3 mm pulmonary nodule of the LEFT lower lobe (series 7, image 191). Largest is a LEFT upper lobe pulmonary nodule measuring 5 by 4 mm (series 7, image 133). Upper Abdomen: No acute abnormality. Musculoskeletal: No chest wall abnormality. No acute or significant osseous findings. Review of the MIP images confirms the above findings. IMPRESSION: 1. No pulmonary embolism through the segmental pulmonary arteries. 2. Mild bronchial wall thickening with scattered ground-glass opacities of the RIGHT apex. Findings are nonspecific but could reflect atypical infection. 3. Multiple pulmonary nodules. Most significant: Less than 6 mm solid pulmonary nodule. Per Fleischner Society Guidelines, no routine follow-up imaging is recommended. These guidelines do not apply to immunocompromised patients and patients with cancer. Follow up in patients with significant comorbidities as clinically warranted. For lung cancer screening, adhere to Lung-RADS guidelines. Reference: Radiology. 2017; 284(1):228-43. Electronically Signed   By: Meda Klinefelter M.D.   On: 01/08/2023 15:58   DG  Chest 1 View  Result Date: 01/08/2023 CLINICAL DATA:  141880 SOB (shortness of breath) 141880 EXAM: CHEST  1 VIEW COMPARISON:  December 17, 2022 FINDINGS: The cardiomediastinal silhouette is unchanged in contour. No pleural effusion. No pneumothorax. No acute pleuroparenchymal abnormality. IMPRESSION: No acute cardiopulmonary abnormality. Electronically Signed   By: Meda Klinefelter M.D.   On: 01/08/2023 13:00    Assessment/Plan Present on Admission:  Sepsis //  community-acquired pneumonia//  lactic acidosis  Acute bronchitis -Pneumonia order set initiated -IV Rocephin and azithromycin continued -Scheduled nebulizers every 4 hours x 3, PRN nebulizers -Solu-Medrol 40 mg IV every 12 -Mucinex DM every 12, Tessalon Perles as needed -Oxygen as needed -IV fluid hydration.  Follow lactic  acid curve until normalized   Tobacco use -Nicotine patch ordered   Multiple pulmonary nodules -Patient aware   Essential hypertension -Lisinopril resumed   Hyperlipidemia -Lipitor resumed   T2DM -Sliding scale insulin initiated   Jamika Sadek 01/08/2023, 7:40 PM

## 2023-01-08 NOTE — ED Triage Notes (Addendum)
Pt c/o dry cough, SOB and runny nosex2-3d. Pt is tachypneic. Pt having continuous coughing spells in triage.

## 2023-01-09 DIAGNOSIS — A419 Sepsis, unspecified organism: Secondary | ICD-10-CM

## 2023-01-09 DIAGNOSIS — J189 Pneumonia, unspecified organism: Secondary | ICD-10-CM | POA: Diagnosis not present

## 2023-01-09 LAB — BASIC METABOLIC PANEL
Anion gap: 14 (ref 5–15)
BUN: 9 mg/dL (ref 8–23)
CO2: 20 mmol/L — ABNORMAL LOW (ref 22–32)
Calcium: 8.3 mg/dL — ABNORMAL LOW (ref 8.9–10.3)
Chloride: 105 mmol/L (ref 98–111)
Creatinine, Ser: 1.25 mg/dL — ABNORMAL HIGH (ref 0.61–1.24)
GFR, Estimated: >60 mL/min (ref >60)
Glucose, Bld: 254 mg/dL — ABNORMAL HIGH (ref 70–99)
Potassium: 3.9 mmol/L (ref 3.5–5.1)
Sodium: 139 mmol/L (ref 135–145)

## 2023-01-09 LAB — CBC WITH DIFFERENTIAL/PLATELET
Abs Immature Granulocytes: 0.09 10*3/uL — ABNORMAL HIGH (ref 0.00–0.07)
Basophils Absolute: 0 10*3/uL (ref 0.0–0.1)
Basophils Relative: 0 %
Eosinophils Absolute: 0 10*3/uL (ref 0.0–0.5)
Eosinophils Relative: 0 %
HCT: 36.2 % — ABNORMAL LOW (ref 39.0–52.0)
Hemoglobin: 11.7 g/dL — ABNORMAL LOW (ref 13.0–17.0)
Immature Granulocytes: 1 %
Lymphocytes Relative: 5 %
Lymphs Abs: 0.7 10*3/uL (ref 0.7–4.0)
MCH: 29.2 pg (ref 26.0–34.0)
MCHC: 32.3 g/dL (ref 30.0–36.0)
MCV: 90.3 fL (ref 80.0–100.0)
Monocytes Absolute: 0.1 10*3/uL (ref 0.1–1.0)
Monocytes Relative: 1 %
Neutro Abs: 14.1 10*3/uL — ABNORMAL HIGH (ref 1.7–7.7)
Neutrophils Relative %: 93 %
Platelets: 310 10*3/uL (ref 150–400)
RBC: 4.01 MIL/uL — ABNORMAL LOW (ref 4.22–5.81)
RDW: 15.8 % — ABNORMAL HIGH (ref 11.5–15.5)
WBC: 15 10*3/uL — ABNORMAL HIGH (ref 4.0–10.5)
nRBC: 0 % (ref 0.0–0.2)

## 2023-01-09 LAB — LACTIC ACID, PLASMA
Lactic Acid, Venous: 7.5 mmol/L (ref 0.5–1.9)
Lactic Acid, Venous: 4.3 mmol/L (ref 0.5–1.9)

## 2023-01-09 LAB — GLUCOSE, CAPILLARY: Glucose-Capillary: 269 mg/dL — ABNORMAL HIGH (ref 70–99)

## 2023-01-09 LAB — MAGNESIUM: Magnesium: 1.6 mg/dL — ABNORMAL LOW (ref 1.7–2.4)

## 2023-01-09 LAB — HIV ANTIBODY (ROUTINE TESTING W REFLEX): HIV Screen 4th Generation wRfx: NONREACTIVE

## 2023-01-09 MED ORDER — DOXYCYCLINE HYCLATE 50 MG PO CAPS: 50.0000 mg | ORAL_CAPSULE | Freq: Two times a day (BID) | ORAL | 0 refills | Status: AC

## 2023-01-09 MED ORDER — LACTATED RINGERS IV BOLUS
1000.0000 mL | Freq: Once | INTRAVENOUS | Status: AC
  Administered 2023-01-09: 1000 mL via INTRAVENOUS

## 2023-01-09 MED ORDER — BUDESONIDE-FORMOTEROL FUMARATE 80-4.5 MCG/ACT IN AERO: 2.0000 | INHALATION_SPRAY | Freq: Every day | RESPIRATORY_TRACT | 0 refills | Status: AC

## 2023-01-09 NOTE — Discharge Summary (Signed)
Physician Discharge Summary  Patient: Curtis Williamson BJY:[id number] DOB: 01-12-1961   Code Status: Full Code Admit date: 01/08/2023 Discharge date: 01/09/2023 Disposition: Home, No home health services recommended PCP: Georganna Skeans, MD  Recommendations for Outpatient Follow-up:  Follow up with PCP within 1-2 weeks Regarding general hospital follow up and preventative care Consider checking LA Discuss tobacco cessation.  Discharge Diagnoses:  Principal Problem:   Atypical pneumonia Active Problems:   Tobacco dependence   Essential hypertension   Type 2 diabetes mellitus without complication, without long-term current use of insulin (HCC)   Hyperlipidemia   Sepsis [facility])  Brief Hospital Course Summary: Curtis Williamson is a 62 year old male with past medical history of HTN, HLD, T2DM, tobacco use with chronic cough.   He presents to the ED with SOB, cough, rhinorrhea x3 days.    Initial findings: stable on room air. Tmax 100.3, presenting vitals 126/107, HR 104, RR 22.  Lactic acid is 2.3> 3.3.  WBC 13.7, respiratory panel negative.   CT chest atypical pneumonia, multiple pulmonary nodules.  EKG sinus tach.  Blood cultures x 2 collected.   In the ED, received 3L LR, Rocephin and azithromycin.  Patient stable on room air.   Patient was admitted to medicine service for further workup and management of CAP as outlined in detail below.   01/09/23 -patient has remained stable ORA since arrival to the ED. He is ambulatory around his room and hallways without distress. He received IV Abx but will be transitioned to PO Abx for remainder of his treatment.  Of note, although clinically he is feeling well and tolerating a normal diet, his vital signs are stable WNL including afebrile >24 hours, and metabolic panel is normal, his LA has remained elevated to 4.3. With lab delays throughout the hospital, I question the result accuracy as it does not reflect the patient's overall picture.   He was discharged home with symbicort and an antibiotic course as listed below.   Discharge Condition: Good, improved Recommended discharge diet: Regular healthy diet  Consultations: None   Procedures/Studies: None   Discharge Instructions     Discharge patient   Complete by: As directed    Discharge disposition: 01-Home or Self Care   Discharge patient date: 01/09/2023      Allergies as of 01/09/2023       Reactions   Mushroom Extract Complex         Medication List     STOP taking these medications    naloxone 4 MG/0.1ML Liqd nasal spray kit Commonly known as: NARCAN       TAKE these medications    Accu-Chek Guide test strip Generic drug: glucose blood Use as instructed   Accu-Chek Softclix Lancets lancets Use as instructed   budesonide-formoterol 80-4.5 MCG/ACT inhaler Commonly known as: Symbicort Inhale 2 puffs into the lungs daily.   cyclobenzaprine 10 MG tablet Commonly known as: FLEXERIL Take 10 mg by mouth 3 (three) times daily as needed.   doxycycline 50 MG capsule Commonly known as: VIBRAMYCIN Take 1 capsule (50 mg total) by mouth 2 (two) times daily for 6 days.   empagliflozin 25 MG Tabs tablet Commonly known as: Jardiance TAKE 1 TABLET BY MOUTH EVERY DAY BEFORE BREAKFAST   latanoprost 0.005 % ophthalmic solution Commonly known as: XALATAN Place 1 drop into both eyes at bedtime.   oxyCODONE-acetaminophen 10-325 MG tablet Commonly known as: PERCOCET        Follow-up Information     Georganna Skeans, MD.  Schedule an appointment as soon as possible for a visit in 1 week(s).   Specialty: Family Medicine Contact information: [address] [place] [address] [phone]                 Subjective   Pt reports feeling much better. Denies CP or SOB when he is at rest or while ambulating. Requests to take home an inhaler and a nebulizer mask.   All questions and concerns were addressed at time of  discharge.  Objective  Blood pressure 132/72, pulse (!) 110, temperature 97.8 F (36.6 C), temperature source Oral, resp. rate 16, height 5\' 11"  (1.803 m), weight 83.5 kg, SpO2 97%.   General: Pt is alert, awake, not in acute distress Cardiovascular: RRR, S1/S2 +, no rubs, no gallops Respiratory: CTA bilaterally, no wheezing, no rhonchi Abdominal: Soft, NT, ND, bowel sounds + Extremities: no edema, no cyanosis  The results of significant diagnostics from this hospitalization (including imaging, microbiology, ancillary and laboratory) are listed below for reference.   Imaging studies: CT Angio Chest PE W/Cm &/Or Wo Cm  Result Date: 01/08/2023 CLINICAL DATA:  Pulmonary embolism (PE) suspected, high prob EXAM: CT ANGIOGRAPHY CHEST WITH CONTRAST TECHNIQUE: Multidetector CT imaging of the chest was performed using the standard protocol during bolus administration of intravenous contrast. Multiplanar CT image reconstructions and MIPs were obtained to evaluate the vascular anatomy. RADIATION DOSE REDUCTION: This exam was performed according to the departmental dose-optimization program which includes automated exposure control, adjustment of the mA and/or kV according to patient size and/or use of iterative reconstruction technique. CONTRAST:  75mL OMNIPAQUE IOHEXOL 350 MG/ML SOLN COMPARISON:  None Available. FINDINGS: Cardiovascular: Evaluation of the bases is limited due to respiratory motion. No pulmonary embolism through the segmental pulmonary arteries. Heart is mildly enlarged. No pericardial effusion. 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 Mediastinum/Nodes: No axillary or mediastinal adenopathy. Visualized thyroid is unremarkable. Lungs/Pleura: No pleural effusion or pneumothorax. Mild bronchial wall thickening. Scattered ground-glass opacities of the RIGHT  apex. Scattered tiny pulmonary nodules. Representative 3 mm pulmonary nodule of the LEFT lower lobe (series 7, image 191). Largest is a LEFT upper lobe pulmonary nodule measuring 5 by 4 mm (series 7, image 133). Upper Abdomen: No acute abnormality. Musculoskeletal: No chest wall abnormality. No acute or significant osseous findings. Review of the MIP images confirms the above findings. IMPRESSION: 1. No pulmonary embolism through the segmental pulmonary arteries. 2. Mild bronchial wall thickening with scattered ground-glass opacities of the RIGHT apex. Findings are nonspecific but could reflect atypical infection. 3. Multiple pulmonary nodules. Most significant: Less than 6 mm solid pulmonary nodule. Per Fleischner Society Guidelines, no routine follow-up imaging is recommended. These guidelines do not apply to immunocompromised patients and patients with cancer. Follow up in patients with significant comorbidities as clinically warranted. For lung cancer screening, adhere to Lung-RADS guidelines. Reference: Radiology. 2017; 284(1):228-43. Electronically Signed   By: Meda Klinefelter M.D.   On: 01/08/2023 15:58   DG Chest 1 View  Result Date: 01/08/2023 CLINICAL DATA:  141880 SOB (shortness of breath) 141880 EXAM: CHEST  1 VIEW COMPARISON:  December 17, 2022 FINDINGS: The cardiomediastinal silhouette is unchanged in contour. No pleural effusion. No pneumothorax. No acute pleuroparenchymal abnormality. IMPRESSION: No acute cardiopulmonary abnormality. Electronically Signed   By: Meda Klinefelter M.D.   On: 01/08/2023 13:00   DG Chest Portable  1 View  Result Date: 12/17/2022 CLINICAL DATA:  Cough and dizziness since last night. EXAM: PORTABLE CHEST 1 VIEW COMPARISON:  Radiographs 10/03/2022 and 03/14/2020. FINDINGS: 1437 hours. The heart size and mediastinal contours are normal. The lungs are clear. There is no pleural effusion or pneumothorax. No acute osseous findings are identified. IMPRESSION: No active  cardiopulmonary process. Electronically Signed   By: Carey Bullocks M.D.   On: 12/17/2022 15:50    Labs: Basic Metabolic Panel: Recent Labs  Lab 01/08/23 1206 01/09/23 0811  NA 140 139  K 3.8 3.9  CL 104 105  CO2 24 20*  GLUCOSE 157* 254*  BUN 9 9  CREATININE 1.10 1.25*  CALCIUM 9.1 8.3*  MG  --  1.6*   CBC: Recent Labs  Lab 01/08/23 1206 01/09/23 0811  WBC 13.7* 15.0*  NEUTROABS  --  14.1*  HGB 14.6 11.7*  HCT 44.8 36.2*  MCV 88.7 90.3  PLT 375 310   Microbiology: Results for orders placed or performed during the hospital encounter of 01/08/23  Resp panel by RT-PCR (RSV, Flu A&B, Covid) Anterior Nasal Swab     Status: None   Collection Time: 01/08/23 12:06 PM   Specimen: Anterior Nasal Swab  Result Value Ref Range Status   SARS Coronavirus 2 by RT PCR NEGATIVE NEGATIVE Final   Influenza A by PCR NEGATIVE NEGATIVE Final   Influenza B by PCR NEGATIVE NEGATIVE Final    Comment: (NOTE) The Xpert Xpress SARS-CoV-2/FLU/RSV plus assay is intended as an aid in the diagnosis of influenza from Nasopharyngeal swab specimens and should not be used as a sole basis for treatment. Nasal washings and aspirates are unacceptable for Xpert Xpress SARS-CoV-2/FLU/RSV testing.  Fact Sheet for Patients: BloggerCourse.com  Fact Sheet for Healthcare Providers: SeriousBroker.it  This test is not yet approved or cleared by the [place] FDA and has been authorized for detection and/or diagnosis of SARS-CoV-2 by FDA under an Emergency Use Authorization (EUA). This EUA will remain in effect (meaning this test can be used) for the duration of the COVID-19 declaration under Section 564(b)(1) of the Act, 21 U.S.C. section 360bbb-3(b)(1), unless the authorization is terminated or revoked.     Resp Syncytial Virus by PCR NEGATIVE NEGATIVE Final    Comment: (NOTE) Fact Sheet for  Patients: BloggerCourse.com  Fact Sheet for Healthcare Providers: SeriousBroker.it  This test is not yet approved or cleared by the [place] FDA and has been authorized for detection and/or diagnosis of SARS-CoV-2 by FDA under an Emergency Use Authorization (EUA). This EUA will remain in effect (meaning this test can be used) for the duration of the COVID-19 declaration under Section 564(b)(1) of the Act, 21 U.S.C. section 360bbb-3(b)(1), unless the authorization is terminated or revoked.  Performed at [facility], [address] [place], [place] [address]   Culture, blood (single)     Status: None (Preliminary result)   Collection Time: 01/08/23  2:28 PM   Specimen: BLOOD RIGHT ARM  Result Value Ref Range Status   Specimen Description BLOOD RIGHT ARM  Final   Special Requests   Final    BOTTLES DRAWN AEROBIC AND ANAEROBIC Blood Culture adequate volume   Culture   Final    NO GROWTH < 24 HOURS Performed at [facility], [address], [place], [place] [address]    Report Status PENDING  Incomplete    Time coordinating discharge: Over 30 minutes  Leeroy Bock, MD  [facility] 01/09/2023, 12:33  PM

## 2023-01-09 NOTE — Progress Notes (Signed)
PT Cancellation Note  Patient Details Name: Curtis Williamson MRN: [id number] DOB: Jan 28, 1961   Cancelled Treatment:    Reason Eval/Treat Not Completed: PT screened, no needs identified, will sign off (seen by OT and independent without acute P.T. needs)   [name] 01/09/2023, 8:57 AM Merryl Hacker, PT Acute Rehabilitation Services Office: [phone]

## 2023-01-09 NOTE — Evaluation (Signed)
Occupational Therapy Evaluation Patient Details Name: Curtis Williamson MRN: [id number] DOB: 1960/08/24 Today's Date: 01/09/2023   History of Present Illness 62 yo male admitted 9/22 with SOB, atypical PNA. PMxh: chronic cough, HTN, HLD, T2DM, tobacco use   Clinical Impression   Curtis Williamson was evaluated s/p the above admission list. He is unhoused and indep at baseline, pt is currently staying in his car with his 87 yo daughter who has a disability. Upon evaluation, pt demonstrated indep ability to complete mobility and ADLs. Initially pt has a limp in his gait due to chronic sciatic pain, however as the session progressed his pain and gait improved. No coughing or SOB throughout. Pt does not require further acute, or follow up OT services. Recommend discharge back to pt's environment with assist as needed. OT to sign off with appreciation of order, please re-consult if needed.          If plan is discharge home, recommend the following: Assistance with cooking/housework    Functional Status Assessment  Patient has had a recent decline in their functional status and demonstrates the ability to make significant improvements in function in a reasonable and predictable amount of time.  Equipment Recommendations  None recommended by OT       Precautions / Restrictions Precautions Precautions: Fall Restrictions Weight Bearing Restrictions: No      Mobility Bed Mobility Overal bed mobility: Needs Assistance Bed Mobility: Supine to Sit, Sit to Supine     Supine to sit: Independent Sit to supine: Independent        Transfers Overall transfer level: Independent                 General transfer comment: no AD      Balance Overall balance assessment: No apparent balance deficits (not formally assessed)           ADL either performed or assessed with clinical judgement   ADL Overall ADL's : Independent;At baseline           General ADL Comments: no AD no overt  LOB; pt is at his functional baseline     Vision Baseline Vision/History: 1 Wears glasses Vision Assessment?: No apparent visual deficits     Perception Perception: Within Functional Limits       Praxis Praxis: WFL       Pertinent Vitals/Pain Pain Assessment Pain Assessment: Faces Faces Pain Scale: Hurts a little bit Pain Location: chronic sciatic pain Pain Descriptors / Indicators: Discomfort Pain Intervention(s): Limited activity within patient's tolerance, Monitored during session     Extremity/Trunk Assessment Upper Extremity Assessment Upper Extremity Assessment: Overall WFL for tasks assessed   Lower Extremity Assessment Lower Extremity Assessment: Generalized weakness   Cervical / Trunk Assessment Cervical / Trunk Assessment: Normal   Communication Communication Communication: No apparent difficulties   Cognition Arousal: Alert Behavior During Therapy: WFL for tasks assessed/performed Overall Cognitive Status: Within Functional Limits for tasks assessed             General Comments: overall WFL, anxious to d/c today so he can pick his daughter up from school     General Comments  VSS, no coughing throughout the session     Home Living Family/patient expects to be discharged to:: Shelter/Homeless           Additional Comments: lost housing 1 month ago, has been staying in his car with his 61 yo daughter      Prior Functioning/Environment Prior Level of Function : Independent/Modified Independent;Driving  Mobility Comments: No AD, walks a lot at baseline ADLs Comments: indep; unhoused, takes care of his 56 yo daughter who is disabled        OT Problem List: Decreased activity tolerance         OT Goals(Current goals can be found in the care plan section) Acute Rehab OT Goals Patient Stated Goal: to leave today OT Goal Formulation: With patient Time For Goal Achievement: 01/09/23 Potential to Achieve Goals: Good    AM-PAC OT "6 Clicks" Daily Activity     Outcome Measure Help from another person eating meals?: None Help from another person taking care of personal grooming?: None Help from another person toileting, which includes using toliet, bedpan, or urinal?: None Help from another person bathing (including washing, rinsing, drying)?: None Help from another person to put on and taking off regular upper body clothing?: None Help from another person to put on and taking off regular lower body clothing?: None 6 Click Score: 24   End of Session Nurse Communication: Mobility status  Activity Tolerance: Patient tolerated treatment well Patient left: in bed;with call bell/phone within reach  OT Visit Diagnosis: Other abnormalities of gait and mobility (R26.89)                Time: 5784-6962 OT Time Calculation (min): 15 min Charges:  OT General Charges $OT Visit: 1 Visit OT Evaluation $OT Eval Low Complexity: 1 Low  Derenda Mis, OTR/L Acute Rehabilitation Services Office [phone] Secure Chat Communication Preferred   Donia Pounds 01/09/2023, 8:28 AM

## 2023-01-09 NOTE — Discharge Instructions (Signed)
Complete your antibiotic course.  Use your prescribed inhaler once daily for the next week or until you follow up with your PCP for hospital follow-up appointment

## 2023-01-09 NOTE — Plan of Care (Signed)
  Problem: Coping: Goal: Ability to adjust to condition or change in health will improve Outcome: Progressing   Problem: Fluid Volume: Goal: Ability to maintain a balanced intake and output will improve Outcome: Progressing   Problem: Metabolic: Goal: Ability to maintain appropriate glucose levels will improve Outcome: Progressing   Problem: Skin Integrity: Goal: Risk for impaired skin integrity will decrease Outcome: Progressing

## 2023-01-09 NOTE — Progress Notes (Signed)
Initial Nutrition Assessment  DOCUMENTATION CODES:   Not applicable  INTERVENTION:  Snack BID   NUTRITION DIAGNOSIS:   Increased nutrient needs related to acute illness as evidenced by estimated needs.    GOAL:   Patient will meet greater than or equal to 90% of their needs    MONITOR:   PO intake, Weight trends, Labs  REASON FOR ASSESSMENT:   Consult Assessment of nutrition requirement/status  ASSESSMENT:   62 y.o. M, Admitted with atypical pneumonia. 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 Present on admission; Sepsis, Acute bronchitis, multiple pulmonary nodes, HTN, HLD, T2DM Pt very friendly.  Stated that his appetite has been great and that he can not seem to get enough food. Stated when he is not feeling well he tends to eat more. Missing all his teeth but states he does not have any chewing concerns.  Stated UBW 182-185#, Weight trending stable. Mushrooms cause N/V/D.  100% of meal consumed. Expects to discharge today.  Meds; novolog,  BG: 157  NUTRITION - FOCUSED PHYSICAL EXAM:  Flowsheet Row Most Recent Value  Orbital Region No depletion  Upper Arm Region No depletion  Thoracic and Lumbar Region No depletion  Buccal Region No depletion  Temple Region No depletion  Clavicle Bone Region No depletion  Clavicle and Acromion Bone Region No depletion  Scapular Bone Region No depletion  Dorsal Hand No depletion  Patellar Region No depletion  Anterior Thigh Region No depletion  Posterior Calf Region No depletion  Edema (RD Assessment) None  Hair Reviewed  Eyes Reviewed  Mouth Reviewed  Skin Reviewed  Nails Reviewed       Diet Order:   Diet Order             Diet heart healthy/carb modified Room service appropriate? Yes; Fluid consistency: Thin  Diet effective now                   EDUCATION NEEDS:   Education needs have been addressed  Skin:  Skin Assessment: Reviewed RN Assessment  Last BM:   9/22  Height:   Ht Readings from Last 1 Encounters:  01/08/23 5\' 11"  (1.803 m)    Weight:   Wt Readings from Last 1 Encounters:  01/08/23 83.5 kg    Ideal Body Weight:  77 kg  BMI:  Body mass index is 25.67 kg/m.  Estimated Nutritional Needs:   Kcal:  1980-2380 Kcal  Protein:  84-100 g  Fluid:  2090-2500 ml    Ricardo Jericho, RDN, LDN

## 2023-01-09 NOTE — Progress Notes (Signed)
Discharge instructions reviewed with pt.  Copy of instructions given to pt. Pt informed his scripts were sent to his pharmacy for pick up. Pt verbalized understanding of instructions.  Pt d/c'd via wheelchair with belongings, pt driving self home, states his car is in the parking lot.  Pt has steady gait. Escorted by hospital volunteer.   Illana Nolting,RN SWOT

## 2023-01-10 ENCOUNTER — Telehealth: Payer: Self-pay

## 2023-01-10 NOTE — Transitions of Care (Post Inpatient/ED Visit) (Signed)
01/10/2023  Name: Curtis Williamson MRN: [id number] DOB: June 04, 1960  Today's TOC FU Call Status: Today's TOC FU Call Status:: Unsuccessful Call (1st Attempt) Unsuccessful Call (1st Attempt) Date: 01/10/23  Attempted to reach the patient regarding the most recent Inpatient/ED visit.  Follow Up Plan: Additional outreach attempts will be made to reach the patient to complete the Transitions of Care (Post Inpatient/ED visit) call.   Signature  Robyne Peers, RN

## 2023-01-11 ENCOUNTER — Telehealth: Payer: Self-pay

## 2023-01-11 NOTE — Transitions of Care (Post Inpatient/ED Visit) (Signed)
01/11/2023  Name: Curtis Williamson MRN: [id number] DOB: 1961/01/06  Today's TOC FU Call Status: Today's TOC FU Call Status:: Successful TOC FU Call Completed Unsuccessful Call (1st Attempt) Date: 01/10/23 [facility] FU Call Complete Date: 01/11/23 Patient's Name and Date of Birth confirmed.  Transition Care Management Follow-up Telephone Call Date of Discharge: 01/09/23 Discharge Facility: [facility]) Type of Discharge: Inpatient Admission Primary Inpatient Discharge Diagnosis:: atypical pneumonia How have you been since you were released from the hospital?: Better (he reports stillfeeling weak and congested  and now his 86 yo son is sick.) Any questions or concerns?: Yes Patient Questions/Concerns:: He is homeless and currently staying in his car with his son. he receives $ 825/month SSI and food stamps and he has not been able to find affordable housing. He said he has contacted [facility] [employer], shelters, "everyone" and has not had any luck. he has also been in contact with his son's school counsleors. He said he has been in contact with a friend to secure some housing and he is waiting for her to get her apartment. Patient Questions/Concerns Addressed: Other: (He is waiiting for his friend to get an apartment.  Other resources have been exhaustd at this time)  Items Reviewed: Did you receive and understand the discharge instructions provided?: Yes Medications obtained,verified, and reconciled?: Partial Review Completed Reason for Partial Mediation Review: he said he has all of his medications and he did not have any questions about the med regime.  He has a glucometer but said he has not been using it. Any new allergies since your discharge?: No Dietary orders reviewed?: No Do you have support at home?: No (He is homelessand currently staying in his car with his103 yo son.)  Medications Reviewed Today: Medications Reviewed Today   Medications were not reviewed in this  encounter     Home Care and Equipment/Supplies: Were Home Health Services Ordered?: No Any new equipment or medical supplies ordered?: No  Functional Questionnaire: Do you need assistance with bathing/showering or dressing?: No Do you need assistance with meal preparation?: No Do you need assistance with eating?: No Do you have difficulty maintaining continence: No Do you need assistance with getting out of bed/getting out of a chair/moving?: Yes (He said he has a cane to use if needed,) Do you have difficulty managing or taking your medications?: No  Follow up appointments reviewed: PCP Follow-up appointment confirmed?: No (He said he will call [facility] to schedule an appointment for he and his son. I offered to schedule the appts but he sais he  was having problems with his phone and said he would need to call back to schedule the appts.) MD Provider Line Number:[phone] Given: No Specialist Hospital Follow-up appointment confirmed?: NA Do you need transportation to your follow-up appointment?: No Do you understand care options if your condition(s) worsen?: Yes-patient verbalized understanding    SIGNATURE Robyne Peers, RN

## 2023-01-12 LAB — CULTURE, BLOOD (SINGLE)
Culture: NO GROWTH
Special Requests: ADEQUATE

## 2023-03-12 ENCOUNTER — Other Ambulatory Visit: Payer: Self-pay

## 2023-03-12 ENCOUNTER — Encounter (HOSPITAL_COMMUNITY): Payer: Self-pay | Admitting: *Deleted

## 2023-03-12 ENCOUNTER — Ambulatory Visit (HOSPITAL_COMMUNITY)
Admission: EM | Admit: 2023-03-12 | Discharge: 2023-03-12 | Disposition: A | Discharge status: Home / Self Care | Payer: Medicaid Other | Attending: Internal Medicine | Admitting: Internal Medicine

## 2023-03-12 DIAGNOSIS — L309 Dermatitis, unspecified: Secondary | ICD-10-CM

## 2023-03-12 MED ORDER — HYDROCORTISONE 2.5 % EX LOTN: TOPICAL_LOTION | Freq: Two times a day (BID) | CUTANEOUS | 2 refills | Status: AC

## 2023-03-12 NOTE — ED Provider Notes (Signed)
[facility]    CSN: [id number] Arrival date & time: 03/12/23  1656      History   Chief Complaint Chief Complaint  Patient presents with   drainage on penis    HPI Curtis Williamson is a 62 y.o. male.   62 year old male who presents to urgent care with complaints of scabbed areas on the penis.  He reports that this started about 1-2 weeks ago.  It started off with a crusty appearance around the tip of the penis with extension down the posterior aspect into the scrotum.  The area was slightly itchy at first.  He washed the area off but it immediately came back.  He then switched to using Dove versus his scented body wash and wash the area off again and this time it came back only on the tip of the penis.  He denies any concerns for STI, dysuria, hematuria.  He denies any drainage or pain.  He does have a history of eczema.      Past Medical History:  Diagnosis Date   Cerebral aneurysm without rupture    Chronic kidney disease    only has one kidney   Diabetes mellitus without complication (HCC)    Essential hypertension    Hyperlipidemia    Hypertension    Phreesia 01/13/2020    Patient Active Problem List   Diagnosis Date Noted   Sepsis (HCC) 01/09/2023   CAP (community acquired pneumonia) 01/09/2023   Atypical pneumonia 01/08/2023   Lumbosacral spondylosis without myelopathy 12/13/2021   Low back pain 10/28/2020   Pain due to onychomycosis of toenails of both feet 10/09/2020   Hyperlipidemia 09/22/2020   Thickened nails 09/22/2020   Insomnia 09/22/2020   Acute pain of left shoulder 05/19/2020   Tobacco dependence 03/26/2019   Essential hypertension 03/26/2019   Type 2 diabetes mellitus without complication, without long-term current use of insulin (HCC) 03/26/2019   Aneurysm, cerebral, nonruptured 03/26/2019    Past Surgical History:  Procedure Laterality Date   KIDNEY DONATION     MANDIBLE FRACTURE SURGERY     ROTATOR CUFF REPAIR Right         Home Medications    Prior to Admission medications   Medication Sig Start Date End Date Taking? Authorizing Provider  Accu-Chek Softclix Lancets lancets Use as instructed 10/13/22  Yes Georganna Skeans, MD  budesonide-formoterol [facility]) 80-4.5 MCG/ACT inhaler Inhale 2 puffs into the lungs daily. 01/09/23  Yes Leeroy Bock, MD  cyclobenzaprine (FLEXERIL) 10 MG tablet Take 10 mg by mouth 3 (three) times daily as needed. 11/25/22  Yes [provider]  empagliflozin (JARDIANCE) 25 MG TABS tablet TAKE 1 TABLET BY MOUTH EVERY DAY BEFORE BREAKFAST 08/12/22  Yes Georganna Skeans, MD  glucose blood (ACCU-CHEK GUIDE) test strip Use as instructed 09/22/20  Yes Mayers, Cari S, PA-C  latanoprost (XALATAN) 0.005 % ophthalmic solution Place 1 drop into both eyes at bedtime. 08/28/21  Yes [provider]  oxyCODONE-acetaminophen (PERCOCET) 10-325 MG tablet  07/14/22  Yes [provider]    Family History Family History  Problem Relation Age of Onset   Hypertension Mother    Kidney failure Mother    Kidney disease Mother    Heart disease Mother    ADD / ADHD Daughter    Anxiety disorder Daughter    Alcohol abuse Father    Colon cancer Neg Hx    Stomach cancer Neg Hx    Esophageal cancer Neg Hx    Colon polyps  Neg Hx     Social History Social History   Tobacco Use   Smoking status: Every Day    Current packs/day: 0.33    Average packs/day: 0.3 packs/day for 30.0 years (9.9 ttl pk-yrs)    Types: Cigarettes   Smokeless tobacco: Never   Tobacco comments:    off and on, has tried to quit  Vaping Use   Vaping status: Never Used  Substance Use Topics   Alcohol use: Yes    Comment: occ   Drug use: Never     Allergies   Mushroom extract complex   Review of Systems Review of Systems  Constitutional:  Negative for chills and fever.  HENT:  Negative for ear pain and sore throat.   Eyes:  Negative for pain and visual disturbance.  Respiratory:   Negative for cough and shortness of breath.   Cardiovascular:  Negative for chest pain and palpitations.  Gastrointestinal:  Negative for abdominal pain and vomiting.  Genitourinary:  Negative for dysuria and hematuria.       Dry scabbed areas on the penis  Musculoskeletal:  Negative for arthralgias and back pain.  Skin:  Negative for color change and rash.  Neurological:  Negative for seizures and syncope.  All other systems reviewed and are negative.    Physical Exam Triage Vital Signs ED Triage Vitals  Encounter Vitals Group     BP 03/12/23 1814 125/85     Systolic BP Percentile --      Diastolic BP Percentile --      Pulse Rate 03/12/23 1814 (!) 112     Resp 03/12/23 1814 20     Temp 03/12/23 1814 98 F (36.7 C)     Temp src --      SpO2 03/12/23 1814 94 %     Weight --      Height --      Head Circumference --      Peak Flow --      Pain Score 03/12/23 1812 0     Pain Loc --      Pain Education --      Exclude from Growth Chart --    No data found.  Updated Vital Signs BP 125/85   Pulse (!) 112   Temp 98 F (36.7 C)   Resp 20   SpO2 94%   Visual Acuity Right Eye Distance:   Left Eye Distance:   Bilateral Distance:    Right Eye Near:   Left Eye Near:    Bilateral Near:     Physical Exam Vitals and nursing note reviewed. Exam conducted with a chaperone present.  Constitutional:      General: He is not in acute distress.    Appearance: He is well-developed.  HENT:     Head: Normocephalic and atraumatic.  Eyes:     Conjunctiva/sclera: Conjunctivae normal.  Cardiovascular:     Rate and Rhythm: Normal rate and regular rhythm.     Heart sounds: No murmur heard. Pulmonary:     Effort: Pulmonary effort is normal. No respiratory distress.     Breath sounds: Normal breath sounds.  Abdominal:     Palpations: Abdomen is soft.     Tenderness: There is no abdominal tenderness.  Genitourinary:    Comments: Dry flaky skin the tip of the penis, the remaining  penis and scrotum appears normal Musculoskeletal:        General: No swelling.     Cervical back: Neck supple.  Skin:    General: Skin is warm and dry.     Capillary Refill: Capillary refill takes less than 2 seconds.  Neurological:     Mental Status: He is alert.  Psychiatric:        Mood and Affect: Mood normal.      UC Treatments / Results  Labs (all labs ordered are listed, but only abnormal results are displayed) Labs Reviewed - No data to display  EKG   Radiology No results found.  Procedures Procedures (including critical care time)  Medications Ordered in UC Medications - No data to display  Initial Impression / Assessment and Plan / UC Course  I have reviewed the triage vital signs and the nursing notes.  Pertinent labs & imaging results that were available during my care of the patient were reviewed by me and considered in my medical decision making (see chart for details).     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  Recommend hydrocortisone 2.5% twice daily to the penis when affected.  Continue to use a body wash such as Dove or other sensitive skin soap. Return to urgent care or PCP if symptoms worsen or fail to resolve.    Final Clinical Impressions(s) / UC Diagnoses   Final diagnoses:  None   Discharge Instructions   None    ED Prescriptions   None    PDMP not reviewed this encounter.   Landis Martins, [place] 03/12/23 1846

## 2023-03-12 NOTE — ED Triage Notes (Signed)
Pt reports some dry ,crusty skin that is peeling on the head of penis.

## 2023-03-12 NOTE — Discharge Instructions (Signed)
Apply hydrocortisone prescription cream to the penis twice daily as needed for skin changes.  Return to urgent care or PCP if symptoms worsen or fail to resolve.

## 2023-03-24 ENCOUNTER — Encounter (HOSPITAL_COMMUNITY): Payer: Self-pay | Admitting: *Deleted

## 2023-03-24 ENCOUNTER — Ambulatory Visit (HOSPITAL_COMMUNITY)
Admission: EM | Admit: 2023-03-24 | Discharge: 2023-03-24 | Disposition: A | Discharge status: Home / Self Care | Payer: Medicaid Other | Attending: Family Medicine | Admitting: Family Medicine

## 2023-03-24 ENCOUNTER — Ambulatory Visit (INDEPENDENT_AMBULATORY_CARE_PROVIDER_SITE_OTHER): Payer: Medicaid Other

## 2023-03-24 DIAGNOSIS — J069 Acute upper respiratory infection, unspecified: Secondary | ICD-10-CM

## 2023-03-24 LAB — POC COVID19/FLU A&B COMBO
Covid Antigen, POC: NEGATIVE
Influenza A Antigen, POC: NEGATIVE
Influenza B Antigen, POC: NEGATIVE

## 2023-03-24 MED ORDER — BENZONATATE 200 MG PO CAPS: 200.0000 mg | ORAL_CAPSULE | Freq: Three times a day (TID) | ORAL | 0 refills | Status: AC | PRN

## 2023-03-24 MED ORDER — ALBUTEROL SULFATE HFA 108 (90 BASE) MCG/ACT IN AERS: 2.0000 | INHALATION_SPRAY | RESPIRATORY_TRACT | 0 refills | Status: AC | PRN

## 2023-03-24 NOTE — Discharge Instructions (Addendum)
You were seen today for upper respiratory symptoms.  Your flu/covid test was negative.  Your chest xray was read as normal today, which means this is likely viral.  I have sent out a medication for the cough, and an inhaler to help with any wheezing or shortness of breath.  You may use mucinex as well, and tylenol and salt water gargles for sore throat.  Please return if you are not improving or worsening.

## 2023-03-24 NOTE — ED Triage Notes (Signed)
Pt states he has cough, congestion and sore throat X 3 days. He hasn't been taking any meds. He states his chest burns from the cough, but he hasn't been using his symbicort.

## 2023-03-24 NOTE — ED Provider Notes (Signed)
[facility]    CSN: [id number] Arrival date & time: 03/24/23  0909      History   Chief Complaint Chief Complaint  Patient presents with   Cough   Nasal Congestion   Sore Throat    HPI Curtis Williamson is a 62 y.o. male.    Cough Associated symptoms: rhinorrhea and sore throat   Sore Throat Patient is here for URI symptoms x 3 days.  Cough, congestion, sore throat.  Cough is the worst, causing burning in the throat and the chest.  No sob per se.  +phlegm He has h/o bronchitis. No inhalers at home.  No otc meds used.  No known fevers/chills.        Past Medical History:  Diagnosis Date   Cerebral aneurysm without rupture    Chronic kidney disease    only has one kidney   Diabetes mellitus without complication (HCC)    Essential hypertension    Hyperlipidemia    Hypertension    Phreesia 01/13/2020    Patient Active Problem List   Diagnosis Date Noted   Sepsis (HCC) 01/09/2023   CAP (community acquired pneumonia) 01/09/2023   Atypical pneumonia 01/08/2023   Lumbosacral spondylosis without myelopathy 12/13/2021   Low back pain 10/28/2020   Pain due to onychomycosis of toenails of both feet 10/09/2020   Hyperlipidemia 09/22/2020   Thickened nails 09/22/2020   Insomnia 09/22/2020   Acute pain of left shoulder 05/19/2020   Tobacco dependence 03/26/2019   Essential hypertension 03/26/2019   Type 2 diabetes mellitus without complication, without long-term current use of insulin (HCC) 03/26/2019   Aneurysm, cerebral, nonruptured 03/26/2019    Past Surgical History:  Procedure Laterality Date   KIDNEY DONATION     MANDIBLE FRACTURE SURGERY     ROTATOR CUFF REPAIR Right        Home Medications    Prior to Admission medications   Medication Sig Start Date End Date Taking? Authorizing Provider  atorvastatin (LIPITOR) 10 MG tablet Take 10 mg by mouth daily. 02/27/23  Yes [provider]  cyclobenzaprine (FLEXERIL) 10 MG tablet  Take 10 mg by mouth 3 (three) times daily as needed. 11/25/22  Yes [provider]  empagliflozin (JARDIANCE) 25 MG TABS tablet TAKE 1 TABLET BY MOUTH EVERY DAY BEFORE BREAKFAST 08/12/22  Yes Georganna Skeans, MD  metFORMIN (GLUCOPHAGE) 500 MG tablet Take 500 mg by mouth 2 (two) times daily. 02/27/23  Yes [provider]  oxyCODONE-acetaminophen (PERCOCET) 10-325 MG tablet  07/14/22  Yes [provider]  Vitamin D, Ergocalciferol, (DRISDOL) 1.25 MG (50000 UNIT) CAPS capsule Take 50,000 Units by mouth once a week. 02/27/23  Yes [provider]  Accu-Chek Softclix Lancets lancets Use as instructed 10/13/22   Georganna Skeans, MD  budesonide-formoterol [facility]) 80-4.5 MCG/ACT inhaler Inhale 2 puffs into the lungs daily. 01/09/23   Leeroy Bock, MD  glucose blood (ACCU-CHEK GUIDE) test strip Use as instructed 09/22/20   Mayers, Cari S, PA-C  hydrocortisone 2.5 % lotion Apply topically 2 (two) times daily. Apply to the affected areas of the penis twice daily as needed 03/12/23   Doristine Mango A, PA-C  latanoprost (XALATAN) 0.005 % ophthalmic solution Place 1 drop into both eyes at bedtime. 08/28/21   [provider]    Family History Family History  Problem Relation Age of Onset   Hypertension Mother    Kidney failure Mother    Kidney disease Mother    Heart disease Mother  ADD / ADHD Daughter    Anxiety disorder Daughter    Alcohol abuse Father    Colon cancer Neg Hx    Stomach cancer Neg Hx    Esophageal cancer Neg Hx    Colon polyps Neg Hx     Social History Social History   Tobacco Use   Smoking status: Every Day    Current packs/day: 0.33    Average packs/day: 0.3 packs/day for 30.0 years (9.9 ttl pk-yrs)    Types: Cigarettes   Smokeless tobacco: Never   Tobacco comments:    off and on, has tried to quit  Vaping Use   Vaping status: Never Used  Substance Use Topics   Alcohol use: Yes    Comment: occ   Drug use: Never      Allergies   Mushroom extract complex (do not select)   Review of Systems Review of Systems  Constitutional:  Positive for fatigue.  HENT:  Positive for congestion, rhinorrhea and sore throat.   Respiratory:  Positive for cough.   Cardiovascular: Negative.   Gastrointestinal: Negative.   Genitourinary: Negative.   Musculoskeletal: Negative.   Psychiatric/Behavioral: Negative.       Physical Exam Triage Vital Signs ED Triage Vitals  Encounter Vitals Group     BP 03/24/23 0922 119/80     Systolic BP Percentile --      Diastolic BP Percentile --      Pulse Rate 03/24/23 0922 (!) 101     Resp 03/24/23 0922 20     Temp 03/24/23 0922 98.2 F (36.8 C)     Temp Source 03/24/23 0922 Oral     SpO2 03/24/23 0922 96 %     Weight --      Height --      Head Circumference --      Peak Flow --      Pain Score 03/24/23 0920 4     Pain Loc --      Pain Education --      Exclude from Growth Chart --    No data found.  Updated Vital Signs BP 119/80 (BP Location: Right Arm)   Pulse (!) 101   Temp 98.2 F (36.8 C) (Oral)   Resp 20   SpO2 96%   Visual Acuity Right Eye Distance:   Left Eye Distance:   Bilateral Distance:    Right Eye Near:   Left Eye Near:    Bilateral Near:     Physical Exam Constitutional:      General: He is not in acute distress.    Appearance: He is well-developed. He is not ill-appearing.  HENT:     Nose: Congestion and rhinorrhea present.     Mouth/Throat:     Mouth: Mucous membranes are moist.     Pharynx: Posterior oropharyngeal erythema present. No pharyngeal swelling or oropharyngeal exudate.  Cardiovascular:     Rate and Rhythm: Normal rate and regular rhythm.     Heart sounds: Normal heart sounds.  Musculoskeletal:     Cervical back: Normal range of motion and neck supple.  Lymphadenopathy:     Cervical: No cervical adenopathy.  Skin:    General: Skin is warm.  Neurological:     General: No focal deficit present.     Mental  Status: He is alert.  Psychiatric:        Mood and Affect: Mood normal.     UC Treatments / Results  Labs (all labs ordered are listed, but  only abnormal results are displayed) Labs Reviewed  POC COVID19/FLU A&B COMBO    EKG   Radiology DG Chest 2 View  Result Date: 03/24/2023 CLINICAL DATA:  Cough EXAM: CHEST - 2 VIEW COMPARISON:  X-ray and CT angiogram 01/08/2023 FINDINGS: No consolidation, pneumothorax or effusion. No edema. Normal cardiopericardial silhouette. Degenerative changes along the spine. IMPRESSION: No acute cardiopulmonary disease Electronically Signed   By: Karen Kays M.D.   On: 03/24/2023 10:26    Procedures Procedures (including critical care time)  Medications Ordered in UC Medications - No data to display  Initial Impression / Assessment and Plan / UC Course  I have reviewed the triage vital signs and the nursing notes.  Pertinent labs & imaging results that were available during my care of the patient were reviewed by me and considered in my medical decision making (see chart for details).   Final Clinical Impressions(s) / UC Diagnoses   Final diagnoses:  Upper respiratory tract infection, unspecified type     Discharge Instructions      You were seen today for upper respiratory symptoms.  Your flu/covid test was negative.  Your chest xray was read as normal today, which means this is likely viral.  I have sent out a medication for the cough, and an inhaler to help with any wheezing or shortness of breath.  You may use mucinex as well, and tylenol and salt water gargles for sore throat.  Please return if you are not improving or worsening.     ED Prescriptions     Medication Sig Dispense Auth. Provider   benzonatate (TESSALON) 200 MG capsule Take 1 capsule (200 mg total) by mouth 3 (three) times daily as needed for cough. 21 capsule Hardeep Reetz, MD   albuterol (VENTOLIN HFA) 108 (90 Base) MCG/ACT inhaler Inhale 2 puffs into the lungs  every 4 (four) hours as needed for wheezing or shortness of breath. 1 each Jannifer Franklin, MD      PDMP not reviewed this encounter.   Jannifer Franklin, MD 03/24/23 [phone]

## 2023-04-26 ENCOUNTER — Encounter: Payer: Self-pay | Admitting: Podiatry

## 2023-04-26 ENCOUNTER — Ambulatory Visit (INDEPENDENT_AMBULATORY_CARE_PROVIDER_SITE_OTHER): Payer: Medicaid Other | Admitting: Podiatry

## 2023-04-26 VITALS — Ht 71.0 in | Wt 184.8 lb

## 2023-04-26 DIAGNOSIS — E119 Type 2 diabetes mellitus without complications: Secondary | ICD-10-CM

## 2023-04-26 DIAGNOSIS — M79675 Pain in left toe(s): Secondary | ICD-10-CM | POA: Diagnosis not present

## 2023-04-26 DIAGNOSIS — M79674 Pain in right toe(s): Secondary | ICD-10-CM | POA: Diagnosis not present

## 2023-04-26 DIAGNOSIS — B351 Tinea unguium: Secondary | ICD-10-CM

## 2023-04-26 NOTE — Progress Notes (Signed)
 This patient presents to the office with chief complaint of long thick painful nails.  Patient says the nails are painful walking and wearing shoes.  This patient is unable to self treat.  This patient is unable to trim his  nails since he is unable to reach his  nails.  She presents to the office for preventative foot care services.  General Appearance  Alert, conversant and in no acute stress.  Vascular  Dorsalis pedis and posterior tibial  pulses are palpable  bilaterally.  Capillary return is within normal limits  bilaterally. Temperature is within normal limits  bilaterally.  Neurologic  Senn-Weinstein monofilament wire test within normal limits  bilaterally. Muscle power within normal limits bilaterally.  Nails Thick disfigured discolored nails with subungual debris  from hallux to fifth toes bilaterally. No evidence of bacterial infection or drainage bilaterally.  Orthopedic  No limitations of motion  feet .  No crepitus or effusions noted.  No bony pathology or digital deformities noted.  Skin  normotropic skin with no porokeratosis noted bilaterally.  No signs of infections or ulcers noted.     Onychomycosis  Nails  B/L.  Pain in right toes  Pain in left toes  Debridement of nails both feet followed trimming the nails with dremel tool.    RTC 6  months.   Cordella Bold DPM

## 2023-06-22 ENCOUNTER — Other Ambulatory Visit (HOSPITAL_COMMUNITY): Payer: Self-pay

## 2023-06-23 ENCOUNTER — Other Ambulatory Visit: Payer: Self-pay

## 2023-06-23 ENCOUNTER — Other Ambulatory Visit (HOSPITAL_COMMUNITY): Payer: Self-pay

## 2023-06-23 MED ORDER — METFORMIN HCL 500 MG PO TABS: 500.0000 mg | ORAL_TABLET | Freq: Two times a day (BID) | ORAL | 1 refills | Status: DC

## 2023-06-23 MED ORDER — EMPAGLIFLOZIN 25 MG PO TABS: 25.0000 mg | ORAL_TABLET | Freq: Every day | ORAL | 1 refills | Status: AC

## 2023-06-23 MED ORDER — CYCLOBENZAPRINE HCL 10 MG PO TABS
10.0000 mg | ORAL_TABLET | Freq: Three times a day (TID) | ORAL | 0 refills | Status: DC | PRN
Start: 2023-06-22 — End: 2023-11-24
  Filled 2023-06-23: qty 63, 21d supply, fill #0

## 2023-06-23 MED ORDER — ATORVASTATIN CALCIUM 10 MG PO TABS
10.0000 mg | ORAL_TABLET | Freq: Every day | ORAL | 3 refills | Status: AC
  Filled 2024-05-03: qty 30, 30d supply, fill #0

## 2023-06-23 MED ORDER — OXYCODONE-ACETAMINOPHEN 10-325 MG PO TABS
1.0000 | ORAL_TABLET | Freq: Four times a day (QID) | ORAL | 0 refills | Status: DC | PRN
Start: 2023-06-23 — End: 2023-07-24
  Filled 2023-06-23: qty 120, 30d supply, fill #0

## 2023-07-05 ENCOUNTER — Other Ambulatory Visit (HOSPITAL_COMMUNITY): Payer: Self-pay

## 2023-07-24 ENCOUNTER — Other Ambulatory Visit (HOSPITAL_COMMUNITY): Payer: Self-pay

## 2023-07-24 ENCOUNTER — Other Ambulatory Visit: Payer: Self-pay

## 2023-07-24 MED ORDER — METFORMIN HCL 1000 MG PO TABS
1000.0000 mg | ORAL_TABLET | Freq: Two times a day (BID) | ORAL | 1 refills | Status: DC
  Filled 2023-07-24 – 2023-07-26 (×2): qty 180, 90d supply, fill #0

## 2023-07-24 MED ORDER — CYCLOBENZAPRINE HCL 10 MG PO TABS
10.0000 mg | ORAL_TABLET | Freq: Three times a day (TID) | ORAL | 0 refills | Status: DC | PRN
  Filled 2023-07-24: qty 90, 30d supply, fill #0
  Filled 2023-07-26: qty 63, 21d supply, fill #0

## 2023-07-24 MED ORDER — ATORVASTATIN CALCIUM 10 MG PO TABS
10.0000 mg | ORAL_TABLET | Freq: Every day | ORAL | 1 refills | Status: AC
  Filled 2023-07-24 – 2023-07-26 (×2): qty 90, 90d supply, fill #0

## 2023-07-24 MED ORDER — JARDIANCE 25 MG PO TABS
25.0000 mg | ORAL_TABLET | Freq: Every day | ORAL | 1 refills | Status: AC
  Filled 2023-07-24 – 2023-08-07 (×3): qty 90, 90d supply, fill #0

## 2023-07-24 MED ORDER — LISINOPRIL 10 MG PO TABS
10.0000 mg | ORAL_TABLET | Freq: Every day | ORAL | 1 refills | Status: AC
  Filled 2023-07-24 – 2024-04-29 (×3): qty 90, 90d supply, fill #0

## 2023-07-24 MED ORDER — OXYCODONE-ACETAMINOPHEN 10-325 MG PO TABS
1.0000 | ORAL_TABLET | Freq: Four times a day (QID) | ORAL | 0 refills | Status: DC | PRN
Start: 2023-07-24 — End: 2023-08-24
  Filled 2023-07-24 (×2): qty 120, 30d supply, fill #0

## 2023-07-24 MED ORDER — MELOXICAM 7.5 MG PO TABS
7.5000 mg | ORAL_TABLET | Freq: Every day | ORAL | 0 refills | Status: DC
  Filled 2023-07-24 – 2023-07-26 (×2): qty 30, 30d supply, fill #0

## 2023-07-24 MED ORDER — NALOXONE HCL 4 MG/0.1ML NA LIQD
1.0000 | NASAL | 2 refills | Status: AC
  Filled 2023-07-24: qty 2, 1d supply, fill #0

## 2023-07-26 ENCOUNTER — Other Ambulatory Visit (HOSPITAL_COMMUNITY): Payer: Self-pay

## 2023-08-03 ENCOUNTER — Ambulatory Visit
Admission: EM | Admit: 2023-08-03 | Discharge: 2023-08-03 | Disposition: A | Discharge status: Home / Self Care | Attending: Physician Assistant | Admitting: Physician Assistant

## 2023-08-03 DIAGNOSIS — R1909 Other intra-abdominal and pelvic swelling, mass and lump: Secondary | ICD-10-CM | POA: Diagnosis not present

## 2023-08-03 LAB — POCT URINALYSIS DIP (MANUAL ENTRY)
Bilirubin, UA: NEGATIVE
Glucose, UA: 500 mg/dL — AB
Ketones, POC UA: NEGATIVE mg/dL
Leukocytes, UA: NEGATIVE
Nitrite, UA: NEGATIVE
Protein Ur, POC: NEGATIVE mg/dL
Spec Grav, UA: 1.015 (ref 1.010–1.025)
Urobilinogen, UA: 0.2 U/dL
pH, UA: 6 (ref 5.0–8.0)

## 2023-08-03 NOTE — Discharge Instructions (Signed)
 Please follow up with your primary care provider.

## 2023-08-03 NOTE — ED Provider Notes (Signed)
 [name]    CSN: [id number] Arrival date & time: 08/03/23  1124      History   Chief Complaint No chief complaint on file.   HPI Jaxten Brosh is a 63 y.o. male.   Here today for evaluation of possible groin swelling.  He reports that his girlfriend recently noticed that an area was swollen when she was "doing a massage".  He denies any pain or discomfort.  He has not had any nausea or constipation.  He has not had fever.  He denies any dysuria.  He denies concerns for STDs.  He has not had any injury to the area.  The history is provided by the patient.    Past Medical History:  Diagnosis Date   Cerebral aneurysm without rupture    Chronic kidney disease    only has one kidney   Diabetes mellitus without complication (HCC)    Essential hypertension    Hyperlipidemia    Hypertension    Phreesia 01/13/2020    Patient Active Problem List   Diagnosis Date Noted   Sepsis (HCC) 01/09/2023   CAP (community acquired pneumonia) 01/09/2023   Atypical pneumonia 01/08/2023   Lumbosacral spondylosis without myelopathy 12/13/2021   Low back pain 10/28/2020   Pain due to onychomycosis of toenails of both feet 10/09/2020   Hyperlipidemia 09/22/2020   Thickened nails 09/22/2020   Insomnia 09/22/2020   Acute pain of left shoulder 05/19/2020   Tobacco dependence 03/26/2019   Essential hypertension 03/26/2019   Type 2 diabetes mellitus without complication, without long-term current use of insulin (HCC) 03/26/2019   Aneurysm, cerebral, nonruptured 03/26/2019    Past Surgical History:  Procedure Laterality Date   KIDNEY DONATION     MANDIBLE FRACTURE SURGERY     ROTATOR CUFF REPAIR Right        Home Medications    Prior to Admission medications   Medication Sig Start Date End Date Taking? Authorizing Provider  atorvastatin (LIPITOR) 10 MG tablet Take 1 tablet (10 mg total) by mouth daily. 07/24/23  Yes   budesonide-formoterol (SYMBICORT) 80-4.5  MCG/ACT inhaler Inhale 2 puffs into the lungs daily. 01/09/23  Yes Leeroy Bock, MD  cyclobenzaprine (FLEXERIL) 10 MG tablet Take 10 mg by mouth 3 (three) times daily as needed. 11/25/22  Yes [provider]  empagliflozin (JARDIANCE) 25 MG TABS tablet Take 1 tablet (25 mg total) by mouth daily. 07/24/23  Yes   lisinopril (ZESTRIL) 10 MG tablet Take 1 tablet (10 mg total) by mouth daily. 07/24/23  Yes   meloxicam (MOBIC) 7.5 MG tablet Take 1 tablet (7.5 mg total) by mouth daily. 07/24/23  Yes   metFORMIN (GLUCOPHAGE) 1000 MG tablet Take 1 tablet (1,000 mg total) by mouth 2 (two) times daily. Patient taking differently: Take 2,000 mg by mouth 2 (two) times daily. 07/24/23  Yes   oxyCODONE-acetaminophen (PERCOCET) 10-325 MG tablet Take 1 tablet by mouth every 6 (six) hours as needed for pain. 07/24/23  Yes   Vitamin D, Ergocalciferol, (DRISDOL) 1.25 MG (50000 UNIT) CAPS capsule Take 50,000 Units by mouth once a week. 02/27/23  Yes [provider]  Accu-Chek Softclix Lancets lancets Use as instructed 10/13/22   Georganna Skeans, MD  albuterol (VENTOLIN HFA) 108 (90 Base) MCG/ACT inhaler Inhale 2 puffs into the lungs every 4 (four) hours as needed for wheezing or shortness of breath. 03/24/23   Piontek, Denny Peon, MD  atorvastatin (LIPITOR) 10 MG tablet Take 10 mg by mouth daily. 02/27/23  [provider]  atorvastatin (LIPITOR) 10 MG tablet Take 1 tablet (10 mg total) by mouth daily. 05/25/23     benzonatate (TESSALON) 200 MG capsule Take 1 capsule (200 mg total) by mouth 3 (three) times daily as needed for cough. 03/24/23   Piontek, Denny Peon, MD  cyclobenzaprine (FLEXERIL) 10 MG tablet Take 1 tablet (10 mg total) by mouth 3 (three) times daily as needed for muscle spasms. 06/22/23   Knox Royalty, MD  cyclobenzaprine (FLEXERIL) 10 MG tablet Take 1 tablet (10 mg total) by mouth 3 (three) times daily as needed for muscle spasms. 07/24/23     empagliflozin (JARDIANCE) 25 MG TABS tablet TAKE 1 TABLET BY  MOUTH EVERY DAY BEFORE BREAKFAST 08/12/22   Georganna Skeans, MD  empagliflozin (JARDIANCE) 25 MG TABS tablet Take 1 tablet (25 mg total) by mouth daily. 05/25/23     glucose blood (ACCU-CHEK GUIDE) test strip Use as instructed 09/22/20   Mayers, Cari S, PA-C  hydrocortisone 2.5 % lotion Apply topically 2 (two) times daily. Apply to the affected areas of the penis twice daily as needed 03/12/23   Doristine Mango A, PA-C  latanoprost (XALATAN) 0.005 % ophthalmic solution Place 1 drop into both eyes at bedtime. 08/28/21   [provider]  metFORMIN (GLUCOPHAGE) 500 MG tablet Take 500 mg by mouth 2 (two) times daily. 02/27/23   [provider]  metFORMIN (GLUCOPHAGE) 500 MG tablet Take 1 tablet (500 mg total) by mouth 2 (two) times daily. 05/25/23     naloxone (NARCAN) nasal spray 4 mg/0.1 mL Place 1 spray in 1 nostril every 2 minutes as needed for opioid overdose; alternate nostrils with each dose until help arrives. 07/24/23     oxyCODONE-acetaminophen (PERCOCET) 10-325 MG tablet  07/14/22   [provider]    Family History Family History  Problem Relation Age of Onset   Hypertension Mother    Kidney failure Mother    Kidney disease Mother    Heart disease Mother    ADD / ADHD Daughter    Anxiety disorder Daughter    Alcohol abuse Father    Colon cancer Neg Hx    Stomach cancer Neg Hx    Esophageal cancer Neg Hx    Colon polyps Neg Hx     Social History Social History   Tobacco Use   Smoking status: Every Day    Current packs/day: 0.33    Average packs/day: 0.3 packs/day for 30.0 years (9.9 ttl pk-yrs)    Types: Cigarettes   Smokeless tobacco: Never   Tobacco comments:    off and on, has tried to quit  Vaping Use   Vaping status: Never Used  Substance Use Topics   Alcohol use: Not Currently    Comment: Occassinally.   Drug use: Never     Allergies   Mushroom extract complex (obsolete)   Review of Systems Review of Systems  Constitutional:  Negative  for chills and fever.  Eyes:  Negative for discharge and redness.  Respiratory:  Negative for shortness of breath.   Gastrointestinal:  Negative for abdominal pain, nausea and vomiting.  Genitourinary:  Negative for dysuria.  Neurological:  Negative for numbness.     Physical Exam Triage Vital Signs ED Triage Vitals  Encounter Vitals Group     BP 08/03/23 1154 108/71     Systolic BP Percentile --      Diastolic BP Percentile --      Pulse Rate 08/03/23 1154 100  Resp 08/03/23 1154 18     Temp 08/03/23 1154 98.6 F (37 C)     Temp Source 08/03/23 1154 Oral     SpO2 08/03/23 1154 95 %     Weight 08/03/23 1149 175 lb (79.4 kg)     Height 08/03/23 1149 5\' 11"  (1.803 m)     Head Circumference --      Peak Flow --      Pain Score 08/03/23 1146 0     Pain Loc --      Pain Education --      Exclude from Growth Chart --    No data found.  Updated Vital Signs BP 108/71 (BP Location: Left Arm)   Pulse 100   Temp 98.6 F (37 C) (Oral)   Resp 18   Ht 5\' 11"  (1.803 m)   Wt 175 lb (79.4 kg)   SpO2 95%   BMI 24.41 kg/m   Visual Acuity Right Eye Distance:   Left Eye Distance:   Bilateral Distance:    Right Eye Near:   Left Eye Near:    Bilateral Near:     Physical Exam Vitals and nursing note reviewed.  Constitutional:      General: He is not in acute distress.    Appearance: Normal appearance. He is not ill-appearing.  HENT:     Head: Normocephalic and atraumatic.  Eyes:     Conjunctiva/sclera: Conjunctivae normal.  Cardiovascular:     Rate and Rhythm: Normal rate.  Pulmonary:     Effort: Pulmonary effort is normal. No respiratory distress.  Genitourinary:    Comments: Chaperone: Bonne Dolores, RN, No clear swelling noted on exam of upper groin where patient reports concern Neurological:     Mental Status: He is alert.  Psychiatric:        Mood and Affect: Mood normal.        Behavior: Behavior normal.        Thought Content: Thought content normal.       UC Treatments / Results  Labs (all labs ordered are listed, but only abnormal results are displayed) Labs Reviewed  POCT URINALYSIS DIP (MANUAL ENTRY) - Abnormal; Notable for the following components:      Result Value   Clarity, UA cloudy (*)    Glucose, UA =500 (*)    Blood, UA trace-intact (*)    All other components within normal limits    EKG   Radiology No results found.  Procedures Procedures (including critical care time)  Medications Ordered in UC Medications - No data to display  Initial Impression / Assessment and Plan / UC Course  I have reviewed the triage vital signs and the nursing notes.  Pertinent labs & imaging results that were available during my care of the patient were reviewed by me and considered in my medical decision making (see chart for details).    No clear swelling appreciated on exam- recommended further evaluation by primary care as patient reports that he is due for prostate check up as well. Encouraged sooner follow up with any further concerns.   Final Clinical Impressions(s) / UC Diagnoses   Final diagnoses:  Groin swelling     Discharge Instructions       Please follow up with your primary care provider.     ED Prescriptions   None    PDMP not reviewed this encounter.   Tomi Bamberger, PA-C 08/03/23 1544

## 2023-08-03 NOTE — ED Triage Notes (Signed)
"  I have some groin/pubic area pain/swelling, no dysuria, no urinary discomfort or problems known". No injury. No nausea. No vomiting. "My girlfriend noticed this area swollen when she was doing a massage or I wouldn't have known". "I also haven't had my prostate checked in a while so maybe I need that".

## 2023-08-07 ENCOUNTER — Other Ambulatory Visit (HOSPITAL_COMMUNITY): Payer: Self-pay

## 2023-08-17 ENCOUNTER — Other Ambulatory Visit (HOSPITAL_COMMUNITY): Payer: Self-pay

## 2023-08-18 ENCOUNTER — Other Ambulatory Visit (HOSPITAL_COMMUNITY): Payer: Self-pay

## 2023-08-24 ENCOUNTER — Other Ambulatory Visit (HOSPITAL_COMMUNITY): Payer: Self-pay

## 2023-08-24 MED ORDER — OXYCODONE-ACETAMINOPHEN 10-325 MG PO TABS
1.0000 | ORAL_TABLET | Freq: Four times a day (QID) | ORAL | 0 refills | Status: DC | PRN
Start: 2023-08-24 — End: 2023-09-25
  Filled 2023-08-24 – 2023-09-01 (×3): qty 120, 30d supply, fill #0

## 2023-08-24 MED ORDER — MELOXICAM 7.5 MG PO TABS
7.5000 mg | ORAL_TABLET | Freq: Every day | ORAL | 0 refills | Status: AC
  Filled 2023-08-24 (×2): qty 30, 30d supply, fill #0

## 2023-08-24 MED ORDER — JARDIANCE 25 MG PO TABS
25.0000 mg | ORAL_TABLET | Freq: Every day | ORAL | 1 refills | Status: AC
  Filled 2023-08-24 – 2024-05-03 (×3): qty 90, 90d supply, fill #0

## 2023-08-24 MED ORDER — CYCLOBENZAPRINE HCL 10 MG PO TABS
10.0000 mg | ORAL_TABLET | Freq: Three times a day (TID) | ORAL | 0 refills | Status: DC | PRN
Start: 2023-08-24 — End: 2023-11-24
  Filled 2023-08-24: qty 62, 21d supply, fill #0

## 2023-08-24 MED ORDER — NALOXONE HCL 4 MG/0.1ML NA LIQD
4.0000 mg | NASAL | 2 refills | Status: AC
  Filled 2023-08-24: qty 2, 1d supply, fill #0

## 2023-08-25 ENCOUNTER — Other Ambulatory Visit (HOSPITAL_COMMUNITY): Payer: Self-pay

## 2023-08-25 ENCOUNTER — Other Ambulatory Visit (HOSPITAL_BASED_OUTPATIENT_CLINIC_OR_DEPARTMENT_OTHER): Payer: Self-pay

## 2023-08-25 ENCOUNTER — Other Ambulatory Visit: Payer: Self-pay

## 2023-08-28 ENCOUNTER — Other Ambulatory Visit (HOSPITAL_COMMUNITY): Payer: Self-pay

## 2023-08-31 ENCOUNTER — Other Ambulatory Visit (HOSPITAL_COMMUNITY): Payer: Self-pay

## 2023-09-01 ENCOUNTER — Other Ambulatory Visit (HOSPITAL_COMMUNITY): Payer: Self-pay

## 2023-09-04 ENCOUNTER — Other Ambulatory Visit (HOSPITAL_COMMUNITY): Payer: Self-pay

## 2023-09-07 ENCOUNTER — Encounter: Payer: Self-pay | Admitting: Family Medicine

## 2023-09-07 ENCOUNTER — Ambulatory Visit (INDEPENDENT_AMBULATORY_CARE_PROVIDER_SITE_OTHER): Admitting: Family Medicine

## 2023-09-07 VITALS — BP 134/88 | HR 91 | Ht 71.0 in | Wt 173.4 lb

## 2023-09-07 DIAGNOSIS — Z13228 Encounter for screening for other metabolic disorders: Secondary | ICD-10-CM | POA: Diagnosis not present

## 2023-09-07 DIAGNOSIS — Z13220 Encounter for screening for lipoid disorders: Secondary | ICD-10-CM

## 2023-09-07 DIAGNOSIS — Z7984 Long term (current) use of oral hypoglycemic drugs: Secondary | ICD-10-CM

## 2023-09-07 DIAGNOSIS — Z130 Encounter for screening for diseases of the blood and blood-forming organs and certain disorders involving the immune mechanism: Secondary | ICD-10-CM | POA: Diagnosis not present

## 2023-09-07 DIAGNOSIS — Z1211 Encounter for screening for malignant neoplasm of colon: Secondary | ICD-10-CM

## 2023-09-07 DIAGNOSIS — E119 Type 2 diabetes mellitus without complications: Secondary | ICD-10-CM

## 2023-09-07 DIAGNOSIS — Z0000 Encounter for general adult medical examination without abnormal findings: Secondary | ICD-10-CM | POA: Diagnosis not present

## 2023-09-07 NOTE — Progress Notes (Signed)
 Established Patient Office Visit  Subjective    Patient ID: Curtis Williamson, male    DOB: 06-13-1960  Age: 63 y.o. MRN: [id number]  CC:  Chief Complaint  Patient presents with   Annual Exam    Prostate exam    Diarrhea    And vomitting when taking ,medication     HPI Curtis Williamson presents for routine annual exam. Patient denies cute complaints.   Outpatient Encounter Medications as of 09/07/2023  Medication Sig   Accu-Chek Softclix Lancets lancets Use as instructed   albuterol  (VENTOLIN  HFA) 108 (90 Base) MCG/ACT inhaler Inhale 2 puffs into the lungs every 4 (four) hours as needed for wheezing or shortness of breath.   atorvastatin  (LIPITOR) 10 MG tablet Take 10 mg by mouth daily.   atorvastatin  (LIPITOR) 10 MG tablet Take 1 tablet (10 mg total) by mouth daily.   atorvastatin  (LIPITOR) 10 MG tablet Take 1 tablet (10 mg total) by mouth daily.   benzonatate  (TESSALON ) 200 MG capsule Take 1 capsule (200 mg total) by mouth 3 (three) times daily as needed for cough.   budesonide -formoterol  (SYMBICORT ) 80-4.5 MCG/ACT inhaler Inhale 2 puffs into the lungs daily.   cyclobenzaprine  (FLEXERIL ) 10 MG tablet Take 10 mg by mouth 3 (three) times daily as needed.   cyclobenzaprine  (FLEXERIL ) 10 MG tablet Take 1 tablet (10 mg total) by mouth 3 (three) times daily as needed for muscle spasms.   cyclobenzaprine  (FLEXERIL ) 10 MG tablet Take 1 tablet (10 mg total) by mouth 3 (three) times daily as needed for muscle spasms.   cyclobenzaprine  (FLEXERIL ) 10 MG tablet Take 1 tablet (10 mg total) by mouth 3 (three) times daily as needed for muscle spasm.   empagliflozin  (JARDIANCE ) 25 MG TABS tablet TAKE 1 TABLET BY MOUTH EVERY DAY BEFORE BREAKFAST   empagliflozin  (JARDIANCE ) 25 MG TABS tablet Take 1 tablet (25 mg total) by mouth daily.   empagliflozin  (JARDIANCE ) 25 MG TABS tablet Take 1 tablet (25 mg total) by mouth daily.   empagliflozin  (JARDIANCE ) 25 MG TABS tablet 1 tab by mouth daily    glucose blood (ACCU-CHEK GUIDE) test strip Use as instructed   hydrocortisone  2.5 % lotion Apply topically 2 (two) times daily. Apply to the affected areas of the penis twice daily as needed   latanoprost (XALATAN) 0.005 % ophthalmic solution Place 1 drop into both eyes at bedtime.   lisinopril  (ZESTRIL ) 10 MG tablet Take 1 tablet (10 mg total) by mouth daily.   meloxicam  (MOBIC ) 7.5 MG tablet Take 1 tablet (7.5 mg total) by mouth daily.   metFORMIN  (GLUCOPHAGE ) 1000 MG tablet Take 1 tablet (1,000 mg total) by mouth 2 (two) times daily. (Patient taking differently: Take 2,000 mg by mouth 2 (two) times daily.)   metFORMIN  (GLUCOPHAGE ) 500 MG tablet Take 500 mg by mouth 2 (two) times daily.   metFORMIN  (GLUCOPHAGE ) 500 MG tablet Take 1 tablet (500 mg total) by mouth 2 (two) times daily.   naloxone  (NARCAN ) nasal spray 4 mg/0.1 mL Place 1 spray in 1 nostril every 2 minutes as needed for opioid overdose; alternate nostrils with each dose until help arrives.   naloxone  (NARCAN ) nasal spray 4 mg/0.1 mL 1 spray every 2 minutes as needed for opioid overdose; spray 1 dose into ONE nostril; alternate nostrils w each dose until help arrives   oxyCODONE -acetaminophen  (PERCOCET) 10-325 MG tablet    oxyCODONE -acetaminophen  (PERCOCET) 10-325 MG tablet Take 1 tablet by mouth every 6 (six) hours as needed for pain.  Vitamin D, Ergocalciferol, (DRISDOL) 1.25 MG (50000 UNIT) CAPS capsule Take 50,000 Units by mouth once a week.   No facility-administered encounter medications on file as of 09/07/2023.    Past Medical History:  Diagnosis Date   Cerebral aneurysm without rupture    Chronic kidney disease    only has one kidney   Diabetes mellitus without complication (HCC)    Essential hypertension    Hyperlipidemia    Hypertension    Phreesia 01/13/2020    Past Surgical History:  Procedure Laterality Date   KIDNEY DONATION     MANDIBLE FRACTURE SURGERY     ROTATOR CUFF REPAIR Right     Family History   Problem Relation Age of Onset   Hypertension Mother    Kidney failure Mother    Kidney disease Mother    Heart disease Mother    ADD / ADHD Daughter    Anxiety disorder Daughter    Alcohol abuse Father    Colon cancer Neg Hx    Stomach cancer Neg Hx    Esophageal cancer Neg Hx    Colon polyps Neg Hx     Social History   Socioeconomic History   Marital status: Single    Spouse name: Not on file   Number of children: 1   Years of education: Not on file   Highest education level: Some college, no degree  Occupational History   Occupation: Truck driver  Tobacco Use   Smoking status: Every Day    Current packs/day: 0.33    Average packs/day: 0.3 packs/day for 30.0 years (9.9 ttl pk-yrs)    Types: Cigarettes   Smokeless tobacco: Never   Tobacco comments:    off and on, has tried to quit  Vaping Use   Vaping status: Never Used  Substance and Sexual Activity   Alcohol use: Not Currently    Comment: Occassinally.   Drug use: Never   Sexual activity: Yes    Birth control/protection: None  Other Topics Concern   Not on file  Social History Narrative   Right handed   One story home   No caffeine    Social Drivers of Corporate investment banker Strain: Not on file  Food Insecurity: Food Insecurity Present (01/08/2023)   Hunger Vital Sign    Worried About Running Out of Food in the Last Year: Sometimes true    Ran Out of Food in the Last Year: Sometimes true  Transportation Needs: No Transportation Needs (01/08/2023)   PRAPARE - Administrator, Civil Service (Medical): No    Lack of Transportation (Non-Medical): No  Physical Activity: Not on file  Stress: Not on file  Social Connections: Not on file  Intimate Partner Violence: Not At Risk (01/08/2023)   Humiliation, Afraid, Rape, and Kick questionnaire    Fear of Current or Ex-Partner: No    Emotionally Abused: No    Physically Abused: No    Sexually Abused: No    Review of Systems  All other systems  reviewed and are negative.       Objective    BP 134/88 (BP Location: Right Arm, Patient Position: Sitting, Cuff Size: Normal)   Pulse 91   Ht 5\' 11"  (1.803 m)   Wt 173 lb 6.4 oz (78.7 kg)   SpO2 98%   BMI 24.18 kg/m   Physical Exam Vitals and nursing note reviewed.  Constitutional:      General: He is not in acute distress. HENT:  Head: Normocephalic and atraumatic.     Right Ear: Tympanic membrane, ear canal and external ear normal.     Left Ear: Tympanic membrane, ear canal and external ear normal.     Nose: Nose normal.     Mouth/Throat:     Mouth: Mucous membranes are moist.     Pharynx: Oropharynx is clear.  Eyes:     Conjunctiva/sclera: Conjunctivae normal.     Pupils: Pupils are equal, round, and reactive to light.  Neck:     Thyroid : No thyromegaly.  Cardiovascular:     Rate and Rhythm: Normal rate and regular rhythm.     Heart sounds: Normal heart sounds. No murmur heard. Pulmonary:     Effort: Pulmonary effort is normal.     Breath sounds: Normal breath sounds.  Abdominal:     General: There is no distension.     Palpations: Abdomen is soft. There is no mass.     Tenderness: There is no abdominal tenderness.     Hernia: There is no hernia in the left inguinal area or right inguinal area.  Musculoskeletal:        General: Normal range of motion.     Cervical back: Normal range of motion and neck supple.     Right lower leg: No edema.     Left lower leg: No edema.  Skin:    General: Skin is warm and dry.  Neurological:     General: No focal deficit present.     Mental Status: He is alert and oriented to person, place, and time. Mental status is at baseline.  Psychiatric:        Mood and Affect: Mood normal.        Behavior: Behavior normal.         Assessment & Plan:   Annual physical exam -     CMP14+EGFR  Screening for deficiency anemia -     CBC with Differential/Platelet  Screening for lipid disorders -     Lipid  panel  Screening for colon cancer -     Cologuard  Screening for endocrine/metabolic/immunity disorders  Type 2 diabetes mellitus without complication, without long-term current use of insulin  (HCC) -     POCT glycosylated hemoglobin (Hb A1C) -     Hemoglobin A1c     No follow-ups on file.   Arlo Lama, MD

## 2023-09-08 ENCOUNTER — Encounter: Payer: Self-pay | Admitting: Family Medicine

## 2023-09-08 LAB — CBC WITH DIFFERENTIAL/PLATELET
Basophils Absolute: 0.1 10*3/uL (ref 0.0–0.2)
Basos: 1 %
EOS (ABSOLUTE): 0.3 10*3/uL (ref 0.0–0.4)
Eos: 3 %
Hematocrit: 46.9 % (ref 37.5–51.0)
Hemoglobin: 14.6 g/dL (ref 13.0–17.7)
Immature Grans (Abs): 0.1 10*3/uL (ref 0.0–0.1)
Immature Granulocytes: 1 %
Lymphocytes Absolute: 2.4 10*3/uL (ref 0.7–3.1)
Lymphs: 23 %
MCH: 28.9 pg (ref 26.6–33.0)
MCHC: 31.1 g/dL — ABNORMAL LOW (ref 31.5–35.7)
MCV: 93 fL (ref 79–97)
Monocytes Absolute: 0.7 10*3/uL (ref 0.1–0.9)
Monocytes: 7 %
Neutrophils Absolute: 6.7 10*3/uL (ref 1.4–7.0)
Neutrophils: 65 %
Platelets: 379 10*3/uL (ref 150–450)
RBC: 5.05 x10E6/uL (ref 4.14–5.80)
RDW: 14 % (ref 11.6–15.4)
WBC: 10.2 10*3/uL (ref 3.4–10.8)

## 2023-09-08 LAB — CMP14+EGFR
ALT: 31 IU/L (ref 0–44)
AST: 23 IU/L (ref 0–40)
Albumin: 4.2 g/dL (ref 3.9–4.9)
Alkaline Phosphatase: 115 IU/L (ref 44–121)
BUN/Creatinine Ratio: 13 (ref 10–24)
BUN: 14 mg/dL (ref 8–27)
Bilirubin Total: <0.2 mg/dL (ref 0.0–1.2)
CO2: 22 mmol/L (ref 20–29)
Calcium: 9.3 mg/dL (ref 8.6–10.2)
Chloride: 105 mmol/L (ref 96–106)
Creatinine, Ser: 1.09 mg/dL (ref 0.76–1.27)
Globulin, Total: 2.9 g/dL (ref 1.5–4.5)
Glucose: 259 mg/dL — ABNORMAL HIGH (ref 70–99)
Potassium: 5 mmol/L (ref 3.5–5.2)
Sodium: 142 mmol/L (ref 134–144)
Total Protein: 7.1 g/dL (ref 6.0–8.5)
eGFR: 76 mL/min/{1.73_m2} (ref >59)

## 2023-09-08 LAB — LIPID PANEL
Chol/HDL Ratio: 3.5 ratio (ref 0.0–5.0)
Cholesterol, Total: 169 mg/dL (ref 100–199)
HDL: 48 mg/dL (ref >39)
LDL Chol Calc (NIH): 99 mg/dL (ref 0–99)
Triglycerides: 122 mg/dL (ref 0–149)
VLDL Cholesterol Cal: 22 mg/dL (ref 5–40)

## 2023-09-14 ENCOUNTER — Ambulatory Visit: Payer: Self-pay | Admitting: Family Medicine

## 2023-09-25 ENCOUNTER — Other Ambulatory Visit: Payer: Self-pay

## 2023-09-25 ENCOUNTER — Other Ambulatory Visit (HOSPITAL_COMMUNITY): Payer: Self-pay

## 2023-09-25 MED ORDER — JARDIANCE 25 MG PO TABS
25.0000 mg | ORAL_TABLET | Freq: Every day | ORAL | 1 refills | Status: AC
  Filled 2023-09-25: qty 90, 90d supply, fill #0

## 2023-09-25 MED ORDER — CYCLOBENZAPRINE HCL 10 MG PO TABS
10.0000 mg | ORAL_TABLET | Freq: Three times a day (TID) | ORAL | 0 refills | Status: DC | PRN
  Filled 2023-09-25: qty 63, 21d supply, fill #0

## 2023-09-25 MED ORDER — OXYCODONE-ACETAMINOPHEN 10-325 MG PO TABS
1.0000 | ORAL_TABLET | Freq: Four times a day (QID) | ORAL | 0 refills | Status: DC | PRN
Start: 2023-09-25 — End: 2023-10-25
  Filled 2023-09-29 (×3): qty 120, 30d supply, fill #0

## 2023-09-25 MED ORDER — MELOXICAM 7.5 MG PO TABS
7.5000 mg | ORAL_TABLET | Freq: Every day | ORAL | 0 refills | Status: AC
  Filled 2023-09-25 – 2024-05-03 (×2): qty 30, 30d supply, fill #0

## 2023-09-25 MED ORDER — NALOXONE HCL 4 MG/0.1ML NA LIQD: 1.0000 | NASAL | 2 refills | Status: AC

## 2023-09-26 ENCOUNTER — Other Ambulatory Visit (HOSPITAL_COMMUNITY): Payer: Self-pay

## 2023-09-29 ENCOUNTER — Other Ambulatory Visit: Payer: Self-pay

## 2023-09-29 ENCOUNTER — Other Ambulatory Visit (HOSPITAL_COMMUNITY): Payer: Self-pay

## 2023-10-05 ENCOUNTER — Other Ambulatory Visit (HOSPITAL_COMMUNITY): Payer: Self-pay

## 2023-10-11 ENCOUNTER — Telehealth: Payer: Self-pay | Admitting: Family Medicine

## 2023-10-11 NOTE — Telephone Encounter (Signed)
 Patient was identified as falling into the [place] Measure - Diabetes.   Patient was: Appointment already scheduled for:  08/25.

## 2023-10-25 ENCOUNTER — Ambulatory Visit: Payer: Medicaid Other | Admitting: Podiatry

## 2023-10-25 ENCOUNTER — Other Ambulatory Visit: Payer: Self-pay

## 2023-10-25 ENCOUNTER — Other Ambulatory Visit (HOSPITAL_COMMUNITY): Payer: Self-pay

## 2023-10-25 MED ORDER — OXYCODONE-ACETAMINOPHEN 10-325 MG PO TABS
1.0000 | ORAL_TABLET | Freq: Four times a day (QID) | ORAL | 0 refills | Status: DC | PRN
Start: 2023-10-25 — End: 2023-11-24
  Filled 2023-10-25 – 2023-10-27 (×2): qty 120, 30d supply, fill #0

## 2023-10-25 MED ORDER — NALOXONE HCL 4 MG/0.1ML NA LIQD: 1.0000 | NASAL | 2 refills | Status: AC

## 2023-10-25 MED ORDER — MELOXICAM 7.5 MG PO TABS
7.5000 mg | ORAL_TABLET | Freq: Every day | ORAL | 0 refills | Status: AC
  Filled 2023-10-25: qty 30, 30d supply, fill #0

## 2023-10-25 MED ORDER — CYCLOBENZAPRINE HCL 10 MG PO TABS
10.0000 mg | ORAL_TABLET | Freq: Three times a day (TID) | ORAL | 0 refills | Status: DC | PRN
  Filled 2023-10-25: qty 90, 30d supply, fill #0

## 2023-10-27 ENCOUNTER — Other Ambulatory Visit: Payer: Self-pay

## 2023-10-27 ENCOUNTER — Other Ambulatory Visit (HOSPITAL_COMMUNITY): Payer: Self-pay

## 2023-11-03 ENCOUNTER — Other Ambulatory Visit (HOSPITAL_COMMUNITY): Payer: Self-pay

## 2023-11-24 ENCOUNTER — Emergency Department (HOSPITAL_COMMUNITY)

## 2023-11-24 ENCOUNTER — Emergency Department (HOSPITAL_COMMUNITY)
Admission: EM | Admit: 2023-11-24 | Discharge: 2023-11-24 | Disposition: A | Discharge status: Home / Self Care | Attending: Emergency Medicine | Admitting: Emergency Medicine

## 2023-11-24 ENCOUNTER — Encounter (HOSPITAL_COMMUNITY): Payer: Self-pay

## 2023-11-24 ENCOUNTER — Other Ambulatory Visit (HOSPITAL_COMMUNITY): Payer: Self-pay

## 2023-11-24 ENCOUNTER — Other Ambulatory Visit: Payer: Self-pay

## 2023-11-24 DIAGNOSIS — Y92410 Unspecified street and highway as the place of occurrence of the external cause: Secondary | ICD-10-CM | POA: Diagnosis not present

## 2023-11-24 DIAGNOSIS — R7989 Other specified abnormal findings of blood chemistry: Secondary | ICD-10-CM

## 2023-11-24 DIAGNOSIS — E876 Hypokalemia: Secondary | ICD-10-CM | POA: Diagnosis not present

## 2023-11-24 DIAGNOSIS — R7401 Elevation of levels of liver transaminase levels: Secondary | ICD-10-CM | POA: Diagnosis not present

## 2023-11-24 DIAGNOSIS — N189 Chronic kidney disease, unspecified: Secondary | ICD-10-CM | POA: Diagnosis not present

## 2023-11-24 DIAGNOSIS — E1122 Type 2 diabetes mellitus with diabetic chronic kidney disease: Secondary | ICD-10-CM | POA: Insufficient documentation

## 2023-11-24 DIAGNOSIS — S0993XA Unspecified injury of face, initial encounter: Secondary | ICD-10-CM | POA: Insufficient documentation

## 2023-11-24 DIAGNOSIS — E1165 Type 2 diabetes mellitus with hyperglycemia: Secondary | ICD-10-CM | POA: Diagnosis not present

## 2023-11-24 DIAGNOSIS — I129 Hypertensive chronic kidney disease with stage 1 through stage 4 chronic kidney disease, or unspecified chronic kidney disease: Secondary | ICD-10-CM | POA: Diagnosis not present

## 2023-11-24 LAB — RAPID URINE DRUG SCREEN, HOSP PERFORMED
Amphetamines: NOT DETECTED
Barbiturates: NOT DETECTED
Benzodiazepines: NOT DETECTED
Cocaine: POSITIVE — AB
Opiates: NOT DETECTED
Tetrahydrocannabinol: NOT DETECTED

## 2023-11-24 LAB — PROTIME-INR
INR: 1 (ref 0.8–1.2)
Prothrombin Time: 14 s (ref 11.4–15.2)

## 2023-11-24 LAB — CBC WITH DIFFERENTIAL/PLATELET
Abs Immature Granulocytes: 0.06 K/uL (ref 0.00–0.07)
Basophils Absolute: 0.1 K/uL (ref 0.0–0.1)
Basophils Relative: 1 %
Eosinophils Absolute: 0.2 K/uL (ref 0.0–0.5)
Eosinophils Relative: 2 %
HCT: 35.8 % — ABNORMAL LOW (ref 39.0–52.0)
Hemoglobin: 11.4 g/dL — ABNORMAL LOW (ref 13.0–17.0)
Immature Granulocytes: 1 %
Lymphocytes Relative: 17 %
Lymphs Abs: 1.8 K/uL (ref 0.7–4.0)
MCH: 28.6 pg (ref 26.0–34.0)
MCHC: 31.8 g/dL (ref 30.0–36.0)
MCV: 89.9 fL (ref 80.0–100.0)
Monocytes Absolute: 0.8 K/uL (ref 0.1–1.0)
Monocytes Relative: 8 %
Neutro Abs: 7.6 K/uL (ref 1.7–7.7)
Neutrophils Relative %: 71 %
Platelets: 368 K/uL (ref 150–400)
RBC: 3.98 MIL/uL — ABNORMAL LOW (ref 4.22–5.81)
RDW: 15.9 % — ABNORMAL HIGH (ref 11.5–15.5)
WBC: 10.6 K/uL — ABNORMAL HIGH (ref 4.0–10.5)
nRBC: 0 % (ref 0.0–0.2)

## 2023-11-24 LAB — COMPREHENSIVE METABOLIC PANEL WITH GFR
ALT: 135 U/L — ABNORMAL HIGH (ref 0–44)
AST: 64 U/L — ABNORMAL HIGH (ref 15–41)
Albumin: 2.9 g/dL — ABNORMAL LOW (ref 3.5–5.0)
Alkaline Phosphatase: 181 U/L — ABNORMAL HIGH (ref 38–126)
Anion gap: 10 (ref 5–15)
BUN: 11 mg/dL (ref 8–23)
CO2: 26 mmol/L (ref 22–32)
Calcium: 8.6 mg/dL — ABNORMAL LOW (ref 8.9–10.3)
Chloride: 99 mmol/L (ref 98–111)
Creatinine, Ser: 1.1 mg/dL (ref 0.61–1.24)
GFR, Estimated: >60 mL/min (ref >60)
Glucose, Bld: 284 mg/dL — ABNORMAL HIGH (ref 70–99)
Potassium: 3.2 mmol/L — ABNORMAL LOW (ref 3.5–5.1)
Sodium: 135 mmol/L (ref 135–145)
Total Bilirubin: 0.6 mg/dL (ref 0.0–1.2)
Total Protein: 7.3 g/dL (ref 6.5–8.1)

## 2023-11-24 LAB — ETHANOL: Alcohol, Ethyl (B): <15 mg/dL (ref <15)

## 2023-11-24 LAB — APTT: aPTT: 27 s (ref 24–36)

## 2023-11-24 MED ORDER — OXYCODONE-ACETAMINOPHEN 10-325 MG PO TABS
1.0000 | ORAL_TABLET | Freq: Four times a day (QID) | ORAL | 0 refills | Status: DC | PRN
  Filled 2023-11-24: qty 10, 3d supply, fill #0

## 2023-11-24 MED ORDER — CYCLOBENZAPRINE HCL 10 MG PO TABS
10.0000 mg | ORAL_TABLET | Freq: Three times a day (TID) | ORAL | 0 refills | Status: DC | PRN
  Filled 2023-11-24: qty 15, 5d supply, fill #0
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
## 2023-11-24 NOTE — ED Notes (Signed)
 Pt provided d/c paperwork, expressed understanding when reviewed.
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
## 2023-11-24 NOTE — ED Notes (Signed)
 [place] J C Collar placed at this time.

## 2023-11-29 ENCOUNTER — Other Ambulatory Visit (HOSPITAL_COMMUNITY): Payer: Self-pay

## 2023-11-29 MED ORDER — CYCLOBENZAPRINE HCL 10 MG PO TABS
10.0000 mg | ORAL_TABLET | Freq: Three times a day (TID) | ORAL | 0 refills | Status: AC | PRN
  Filled 2023-11-29 – 2024-05-03 (×3): qty 90, 30d supply, fill #0

## 2023-11-29 MED ORDER — OXYCODONE-ACETAMINOPHEN 10-325 MG PO TABS
1.0000 | ORAL_TABLET | Freq: Four times a day (QID) | ORAL | 0 refills | Status: DC | PRN
  Filled 2023-11-29 (×2): qty 120, 30d supply, fill #0

## 2023-11-29 MED ORDER — MELOXICAM 7.5 MG PO TABS
7.5000 mg | ORAL_TABLET | Freq: Every day | ORAL | 0 refills | Status: AC
  Filled 2023-11-29 – 2024-04-29 (×2): qty 30, 30d supply, fill #0

## 2023-11-30 ENCOUNTER — Other Ambulatory Visit (HOSPITAL_COMMUNITY): Payer: Self-pay

## 2023-12-07 ENCOUNTER — Ambulatory Visit: Admitting: Family Medicine

## 2023-12-11 ENCOUNTER — Other Ambulatory Visit (HOSPITAL_COMMUNITY): Payer: Self-pay

## 2023-12-25 ENCOUNTER — Other Ambulatory Visit (HOSPITAL_COMMUNITY): Payer: Self-pay

## 2023-12-25 MED ORDER — MELOXICAM 7.5 MG PO TABS
7.5000 mg | ORAL_TABLET | Freq: Every day | ORAL | 0 refills | Status: AC
  Filled 2023-12-25: qty 30, 30d supply, fill #0

## 2023-12-25 MED ORDER — OXYCODONE-ACETAMINOPHEN 10-325 MG PO TABS
1.0000 | ORAL_TABLET | Freq: Four times a day (QID) | ORAL | 0 refills | Status: AC | PRN
  Filled 2023-12-28: qty 120, 30d supply, fill #0

## 2023-12-25 MED ORDER — NALOXONE HCL 4 MG/0.1ML NA LIQD
1.0000 | NASAL | 2 refills | Status: AC | PRN
  Filled 2023-12-25: qty 2, 1d supply, fill #0

## 2023-12-25 MED ORDER — CYCLOBENZAPRINE HCL 10 MG PO TABS
10.0000 mg | ORAL_TABLET | Freq: Three times a day (TID) | ORAL | 0 refills | Status: AC | PRN
  Filled 2023-12-25: qty 63, 21d supply, fill #0
  Filled 2024-04-29: qty 27, 9d supply, fill #0

## 2023-12-25 MED ORDER — GLIPIZIDE ER 2.5 MG PO TB24
2.5000 mg | ORAL_TABLET | Freq: Every day | ORAL | 0 refills | Status: DC
  Filled 2023-12-25: qty 90, 90d supply, fill #0

## 2023-12-25 MED ORDER — JARDIANCE 25 MG PO TABS
25.0000 mg | ORAL_TABLET | Freq: Every day | ORAL | 1 refills | Status: AC
  Filled 2023-12-25 – 2024-04-29 (×2): qty 90, 90d supply, fill #0

## 2023-12-28 ENCOUNTER — Other Ambulatory Visit (HOSPITAL_COMMUNITY): Payer: Self-pay

## 2024-03-05 ENCOUNTER — Other Ambulatory Visit: Payer: Self-pay

## 2024-04-26 ENCOUNTER — Other Ambulatory Visit (HOSPITAL_COMMUNITY): Payer: Self-pay

## 2024-04-26 MED ORDER — ACCU-CHEK GUIDE TEST VI STRP
ORAL_STRIP | 3 refills | Status: AC
  Filled 2024-04-26 – 2024-05-03 (×4): qty 100, 100d supply, fill #0

## 2024-04-26 MED ORDER — EASY TOUCH ALCOHOL PREP MEDIUM 70 % PADS
MEDICATED_PAD | 3 refills | Status: AC
  Filled 2024-04-26 – 2024-05-03 (×3): qty 100, 100d supply, fill #0

## 2024-04-26 MED ORDER — ACCU-CHEK SOFTCLIX LANCETS MISC
3 refills | Status: AC
  Filled 2024-04-26 – 2024-05-03 (×3): qty 100, 100d supply, fill #0

## 2024-04-26 MED ORDER — ACCU-CHEK GUIDE W/DEVICE KIT
PACK | 0 refills | Status: AC
  Filled 2024-04-26 – 2024-05-03 (×3): qty 1, 90d supply, fill #0

## 2024-04-29 ENCOUNTER — Other Ambulatory Visit: Payer: Self-pay

## 2024-04-29 ENCOUNTER — Other Ambulatory Visit (HOSPITAL_COMMUNITY): Payer: Self-pay

## 2024-04-29 ENCOUNTER — Other Ambulatory Visit (HOSPITAL_COMMUNITY): Payer: Self-pay | Admitting: Family Medicine

## 2024-04-30 ENCOUNTER — Other Ambulatory Visit (HOSPITAL_COMMUNITY): Payer: Self-pay

## 2024-04-30 ENCOUNTER — Other Ambulatory Visit: Payer: Self-pay

## 2024-04-30 MED ORDER — LATANOPROST 0.005 % OP SOLN
1.0000 [drp] | Freq: Every evening | OPHTHALMIC | 11 refills | Status: AC
  Filled 2024-04-30 – 2024-05-03 (×2): qty 5, 50d supply, fill #0

## 2024-05-03 ENCOUNTER — Other Ambulatory Visit (HOSPITAL_COMMUNITY): Payer: Self-pay

## 2024-05-03 ENCOUNTER — Other Ambulatory Visit: Payer: Self-pay

## 2024-05-03 MED ORDER — GLIPIZIDE 2.5 MG PO TABS
2.5000 mg | ORAL_TABLET | Freq: Every day | ORAL | 3 refills | Status: AC
  Filled 2024-05-03: qty 90, 90d supply, fill #0
  Filled 2024-05-06: qty 30, 30d supply, fill #0

## 2024-05-06 ENCOUNTER — Other Ambulatory Visit (HOSPITAL_COMMUNITY): Payer: Self-pay

## 2024-05-06 ENCOUNTER — Other Ambulatory Visit: Payer: Self-pay

## 2024-05-07 ENCOUNTER — Other Ambulatory Visit (HOSPITAL_COMMUNITY): Payer: Self-pay

## 2024-05-08 ENCOUNTER — Other Ambulatory Visit (HOSPITAL_COMMUNITY): Payer: Self-pay

## 2024-05-08 ENCOUNTER — Other Ambulatory Visit: Payer: Self-pay

## 2024-05-08 MED ORDER — GLIPIZIDE ER 2.5 MG PO TB24
2.5000 mg | ORAL_TABLET | Freq: Every day | ORAL | 3 refills | Status: AC
  Filled 2024-05-08: qty 90, 90d supply, fill #0

## 2024-05-09 ENCOUNTER — Other Ambulatory Visit (HOSPITAL_COMMUNITY): Payer: Self-pay

## 2024-05-09 ENCOUNTER — Other Ambulatory Visit: Payer: Self-pay

## 2024-05-17 ENCOUNTER — Other Ambulatory Visit (HOSPITAL_COMMUNITY): Payer: Self-pay

## 2024-05-17 MED ORDER — HYDROCORTISONE 2.5 % EX LOTN
TOPICAL_LOTION | Freq: Two times a day (BID) | CUTANEOUS | 0 refills | Status: AC
  Filled 2024-05-21 (×2): qty 59, 30d supply, fill #0

## 2024-05-20 ENCOUNTER — Other Ambulatory Visit (HOSPITAL_COMMUNITY): Payer: Self-pay

## 2024-05-21 ENCOUNTER — Other Ambulatory Visit (HOSPITAL_COMMUNITY): Payer: Self-pay

## 2024-05-21 ENCOUNTER — Other Ambulatory Visit: Payer: Self-pay

## 2024-09-06 ENCOUNTER — Encounter: Admitting: Family Medicine
